# Patient Record
Sex: Female | Born: 1975 | Race: White | Hispanic: No | Marital: Married | Smoking: Never smoker
Health system: Southern US, Community
[De-identification: ages and names within clinical notes are randomized; demographics above are authoritative.]

## PROBLEM LIST (undated history)

## (undated) DIAGNOSIS — D649 Anemia, unspecified: Secondary | ICD-10-CM

## (undated) DIAGNOSIS — O26899 Other specified pregnancy related conditions, unspecified trimester: Secondary | ICD-10-CM

## (undated) DIAGNOSIS — E7212 Methylenetetrahydrofolate reductase deficiency: Secondary | ICD-10-CM

## (undated) DIAGNOSIS — T4145XA Adverse effect of unspecified anesthetic, initial encounter: Secondary | ICD-10-CM

## (undated) DIAGNOSIS — T8859XA Other complications of anesthesia, initial encounter: Secondary | ICD-10-CM

## (undated) DIAGNOSIS — Z1589 Genetic susceptibility to other disease: Secondary | ICD-10-CM

## (undated) DIAGNOSIS — E079 Disorder of thyroid, unspecified: Secondary | ICD-10-CM

## (undated) DIAGNOSIS — IMO0002 Reserved for concepts with insufficient information to code with codable children: Secondary | ICD-10-CM

## (undated) DIAGNOSIS — N946 Dysmenorrhea, unspecified: Secondary | ICD-10-CM

## (undated) DIAGNOSIS — F419 Anxiety disorder, unspecified: Secondary | ICD-10-CM

## (undated) DIAGNOSIS — F329 Major depressive disorder, single episode, unspecified: Secondary | ICD-10-CM

## (undated) DIAGNOSIS — R112 Nausea with vomiting, unspecified: Secondary | ICD-10-CM

## (undated) DIAGNOSIS — I447 Left bundle-branch block, unspecified: Secondary | ICD-10-CM

## (undated) DIAGNOSIS — F32A Depression, unspecified: Secondary | ICD-10-CM

## (undated) DIAGNOSIS — O10919 Unspecified pre-existing hypertension complicating pregnancy, unspecified trimester: Secondary | ICD-10-CM

## (undated) DIAGNOSIS — R12 Heartburn: Secondary | ICD-10-CM

## (undated) DIAGNOSIS — Z98891 History of uterine scar from previous surgery: Secondary | ICD-10-CM

## (undated) DIAGNOSIS — Z9889 Other specified postprocedural states: Secondary | ICD-10-CM

## (undated) HISTORY — DX: Genetic susceptibility to other disease: Z15.89

## (undated) HISTORY — DX: Depression, unspecified: F32.A

## (undated) HISTORY — DX: Major depressive disorder, single episode, unspecified: F32.9

## (undated) HISTORY — DX: Dysmenorrhea, unspecified: N94.6

## (undated) HISTORY — DX: Methylenetetrahydrofolate reductase deficiency: E72.12

## (undated) HISTORY — DX: Left bundle-branch block, unspecified: I44.7

## (undated) HISTORY — DX: Anxiety disorder, unspecified: F41.9

## (undated) HISTORY — PX: TUBAL LIGATION: SHX77

## (undated) HISTORY — PX: ABDOMINAL HYSTERECTOMY: SHX81

---

## 2001-02-27 HISTORY — PX: GASTRIC BYPASS: SHX52

## 2002-02-27 HISTORY — PX: CARDIOVASCULAR STRESS TEST: SHX262

## 2002-08-15 ENCOUNTER — Other Ambulatory Visit: Admission: RE | Admit: 2002-08-15 | Discharge: 2002-08-15 | Payer: Self-pay | Admitting: Family Medicine

## 2002-08-18 ENCOUNTER — Encounter: Admission: RE | Admit: 2002-08-18 | Discharge: 2002-08-18 | Payer: Self-pay | Admitting: Family Medicine

## 2002-08-18 ENCOUNTER — Encounter: Payer: Self-pay | Admitting: Family Medicine

## 2002-11-07 ENCOUNTER — Ambulatory Visit (HOSPITAL_COMMUNITY): Admission: RE | Admit: 2002-11-07 | Discharge: 2002-11-07 | Payer: Self-pay | Admitting: Cardiovascular Disease

## 2002-11-07 HISTORY — PX: CARDIAC CATHETERIZATION: SHX172

## 2002-12-26 ENCOUNTER — Emergency Department (HOSPITAL_COMMUNITY): Admission: EM | Admit: 2002-12-26 | Discharge: 2002-12-26 | Payer: Self-pay | Admitting: Emergency Medicine

## 2004-10-05 ENCOUNTER — Emergency Department (HOSPITAL_COMMUNITY): Admission: EM | Admit: 2004-10-05 | Discharge: 2004-10-05 | Payer: Self-pay | Admitting: Emergency Medicine

## 2005-04-09 ENCOUNTER — Emergency Department (HOSPITAL_COMMUNITY): Admission: EM | Admit: 2005-04-09 | Discharge: 2005-04-10 | Payer: Self-pay | Admitting: Emergency Medicine

## 2005-06-09 ENCOUNTER — Ambulatory Visit (HOSPITAL_COMMUNITY): Admission: RE | Admit: 2005-06-09 | Discharge: 2005-06-09 | Payer: Self-pay | Admitting: Obstetrics and Gynecology

## 2005-06-09 ENCOUNTER — Encounter (INDEPENDENT_AMBULATORY_CARE_PROVIDER_SITE_OTHER): Payer: Self-pay | Admitting: Specialist

## 2006-05-19 ENCOUNTER — Inpatient Hospital Stay (HOSPITAL_COMMUNITY): Admission: AD | Admit: 2006-05-19 | Discharge: 2006-05-19 | Payer: Self-pay | Admitting: Obstetrics and Gynecology

## 2006-07-29 ENCOUNTER — Encounter (INDEPENDENT_AMBULATORY_CARE_PROVIDER_SITE_OTHER): Payer: Self-pay | Admitting: *Deleted

## 2006-07-29 ENCOUNTER — Inpatient Hospital Stay (HOSPITAL_COMMUNITY): Admission: AD | Admit: 2006-07-29 | Discharge: 2006-07-31 | Payer: Self-pay | Admitting: *Deleted

## 2006-08-01 ENCOUNTER — Encounter: Admission: RE | Admit: 2006-08-01 | Discharge: 2006-08-31 | Payer: Self-pay | Admitting: Certified Nurse Midwife

## 2007-06-24 ENCOUNTER — Encounter: Admission: RE | Admit: 2007-06-24 | Discharge: 2007-06-24 | Payer: Self-pay | Admitting: Otolaryngology

## 2008-05-04 ENCOUNTER — Encounter: Admission: RE | Admit: 2008-05-04 | Discharge: 2008-05-04 | Payer: Self-pay | Admitting: Obstetrics and Gynecology

## 2008-05-28 ENCOUNTER — Inpatient Hospital Stay (HOSPITAL_COMMUNITY): Admission: EM | Admit: 2008-05-28 | Discharge: 2008-05-28 | Payer: Self-pay | Admitting: *Deleted

## 2009-03-04 ENCOUNTER — Emergency Department (HOSPITAL_COMMUNITY): Admission: EM | Admit: 2009-03-04 | Discharge: 2009-03-05 | Payer: Self-pay | Admitting: Emergency Medicine

## 2010-03-20 ENCOUNTER — Encounter: Payer: Self-pay | Admitting: Otolaryngology

## 2010-05-15 LAB — COMPREHENSIVE METABOLIC PANEL
ALT: 276 U/L — ABNORMAL HIGH (ref 0–35)
AST: 597 U/L — ABNORMAL HIGH (ref 0–37)
Albumin: 3.6 g/dL (ref 3.5–5.2)
CO2: 21 mEq/L (ref 19–32)
Calcium: 9 mg/dL (ref 8.4–10.5)
GFR calc Af Amer: >60 mL/min (ref >60)
GFR calc non Af Amer: >60 mL/min (ref >60)
Sodium: 135 mEq/L (ref 135–145)
Total Protein: 7.2 g/dL (ref 6.0–8.3)

## 2010-05-15 LAB — URINALYSIS, ROUTINE W REFLEX MICROSCOPIC
Bilirubin Urine: NEGATIVE
Glucose, UA: NEGATIVE mg/dL
Hgb urine dipstick: NEGATIVE
Ketones, ur: NEGATIVE mg/dL
Specific Gravity, Urine: 1.027 (ref 1.005–1.030)
pH: 6 (ref 5.0–8.0)

## 2010-05-15 LAB — DIFFERENTIAL
Eosinophils Absolute: 0 10*3/uL (ref 0.0–0.7)
Eosinophils Relative: 0 % (ref 0–5)
Lymphocytes Relative: 20 % (ref 12–46)
Lymphs Abs: 1.4 10*3/uL (ref 0.7–4.0)
Monocytes Absolute: 0.5 10*3/uL (ref 0.1–1.0)
Monocytes Relative: 7 % (ref 3–12)

## 2010-05-15 LAB — CBC
MCHC: 32.3 g/dL (ref 30.0–36.0)
Platelets: 325 10*3/uL (ref 150–400)
RBC: 4.71 MIL/uL (ref 3.87–5.11)

## 2010-06-08 LAB — CBC
Hemoglobin: 9.3 g/dL — ABNORMAL LOW (ref 12.0–15.0)
MCHC: 31.3 g/dL (ref 30.0–36.0)
MCV: 62.8 fL — ABNORMAL LOW (ref 78.0–100.0)
RBC: 4.72 MIL/uL (ref 3.87–5.11)
WBC: 8.1 10*3/uL (ref 4.0–10.5)

## 2010-06-08 LAB — DIFFERENTIAL
Basophils Relative: 0 % (ref 0–1)
Eosinophils Relative: 0 % (ref 0–5)
Lymphocytes Relative: 3 % — ABNORMAL LOW (ref 12–46)
Monocytes Absolute: 0.2 10*3/uL (ref 0.1–1.0)
Neutrophils Relative %: 95 % — ABNORMAL HIGH (ref 43–77)

## 2010-06-08 LAB — PROTIME-INR
INR: 1 (ref 0.00–1.49)
Prothrombin Time: 13.4 seconds (ref 11.6–15.2)

## 2010-06-08 LAB — URINALYSIS, ROUTINE W REFLEX MICROSCOPIC
Nitrite: NEGATIVE
Protein, ur: NEGATIVE mg/dL
Urobilinogen, UA: 0.2 mg/dL (ref 0.0–1.0)

## 2010-06-08 LAB — COMPREHENSIVE METABOLIC PANEL
ALT: 199 U/L — ABNORMAL HIGH (ref 0–35)
AST: 460 U/L — ABNORMAL HIGH (ref 0–37)
CO2: 21 mEq/L (ref 19–32)
Calcium: 8.5 mg/dL (ref 8.4–10.5)
Chloride: 108 mEq/L (ref 96–112)
Creatinine, Ser: 0.77 mg/dL (ref 0.4–1.2)
Glucose, Bld: 132 mg/dL — ABNORMAL HIGH (ref 70–99)

## 2010-07-12 NOTE — Consult Note (Signed)
[name], [name]               ACCOUNT NO.:  [id number]   MEDICAL RECORD NO.:  [id number]          PATIENT TYPE:  INP   LOCATION:  9109                          [facility]  [facility]   PHYSICIAN:  Cassell Clement, M.D. DATE OF BIRTH:  04/17/1975   DATE OF CONSULTATION:  07/30/2006  DATE OF DISCHARGE:                                 CONSULTATION   HISTORY:  This is a 35 year old married Caucasian female known to me.  She is presently one day post cesarean section which was undertaken  because of a marginal abruption.  She lost a lot of blood at the time of  the abruption.  Postoperatively, she has been noted to be intermittently  in left bundle-branch block.  She has a remote history of palpitations  and had a full workup in 2004.  At that time, she was found to have a  rate-related left bundle-branch block.  Because of an equivocal  Cardiolite stress test, she underwent cardiac catheterization in 2004  which showed normal LV function and normal coronary arteries.  She was  last seen in the office May 22, 2006 after she had presented to  [facility] with dehydration and tachycardia.  EKG  done at [facility] on May 19, 2006 showed no evidence  of ischemia.   PAST MEDICAL HISTORY:  Her past medical history is positive for morbid  obesity and she has had previous gastric bypass with loss of  considerable amount of weight.  She has had a past history of anemia.  She does not use alcohol or tobacco.  She is married.  She works as an  Oncologist at [employer].   FAMILY HISTORY:  Unremarkable.   REVIEW OF SYSTEMS:  Otherwise unremarkable.   PHYSICAL EXAMINATION:  VITAL SIGNS:  Blood pressure 130/70, pulse 70  regular, respirations are normal.  HEAD/NECK:  Normal jugular venous pressure.  Carotids are normal.  Color  is slightly pale.  CHEST:  Clear to auscultation without rales or rhonchi.  HEART:  No murmur, gallop, rub or click.  ABDOMEN:  Not  examined.  EXTREMITIES:  Thick ankles with trace edema.   The telemetry shows normal sinus rhythm with intermittent left bundle-  branch block.   IMPRESSION:  The patient is stable from the cardiac standpoint.  Her  left bundle-branch block is rate-related and is chronic and is  asymptomatic and requires no further treatment.  It is okay at this time  to discontinue telemetry and to move her to a regular postop floor.           ______________________________  Cassell Clement, M.D.     TB/MEDQ  D:  07/30/2006  T:  07/30/2006  Job:  [id number]   cc:   Marlinda Mike, C.N.M.

## 2010-07-12 NOTE — Op Note (Signed)
[name], [name]               ACCOUNT NO.:  [id number]   MEDICAL RECORD NO.:  [id number]          PATIENT TYPE:  INP   LOCATION:  9198                          [facility]   PHYSICIAN:  Gerri Spore B. Earlene Plater, M.D.  DATE OF BIRTH:  [birth year]   DATE OF PROCEDURE:  07/29/2006  DATE OF DISCHARGE:                               OPERATIVE REPORT   PREOPERATIVE DIAGNOSIS:  At 39 weeks with spontaneous rupture of  membranes and suspected abruption.   POSTOPERATIVE DIAGNOSIS:  At 39 weeks with spontaneous rupture of  membranes and suspected abruption.   PROCEDURE:  Primary low transverse cesarean section.   SURGEON:  Chester Holstein. Earlene Plater, M.D.   ASSISTANT:  Marlinda Mike, C.N.M.   ANESTHESIA:  Spinal.   SPECIMENS:  Placenta to pathology.   BLOOD LOSS:  800 mL.   COMPLICATIONS:  None.   FINDINGS:  Viable female, 7 pounds 3 ounces.  Apgars were 9 and 9.  Partial placental abruption with clot noted.  Cord pH 7.2.   INDICATIONS FOR PROCEDURE:  Patient presented with spontaneous rupture  of membranes this morning which was grossly bloody.  She was not having  abdominal pain, had no known risk factors for abruptio placenta.  She  has history of a right bundle branch block as well as morbid obesity,  status post gastric bypass via laparotomy years ago.  In maternity  admissions, patient was found to have grossly bloody amniotic fluid and  cervix was 1 cm dilated and fetal heart rate in the 140s with decreased  variability, no decelerations.  Uterine irritability noted but no  definite contractions.  Given her remoteness from delivery and other  risk factors, I recommended proceeding with a primary C. section at this  point rather than awaiting or inducing labor. The potential need for  stat section would be further complicated given her morbid obesity and  cardiac condition.  Patient in agreement.  Risks of surgery discussed  including infection, bleeding, damage to surrounding organs.   PROCEDURE:  Patient taken to the operating room and spinal anesthesia  obtained.  She was prepped and draped in standard fashion and Foley  catheter inserted into the bladder.  Pfannenstiel incision made.  Fascia  divided sharply.  Underlying rectus muscles dissected away sharply.  Posterior sheath of peritoneum elevated and entered sharply.  Bladder  flap created sharply.  The Alexis retracted was inserted and checked to  be free of bowel.  It was secured in a standard manner with good  exposure obtained.  Bladder flap created sharply.   Uterine incision made in low transverse fashion with a knife.  Bloody  amniotic fluid noted at amniotomy.  The incision was extended laterally  with bandage scissors.   The vertex was elevated through the incision and with fundal pressure,  delivered without difficulty.  Nose and mouth suctioned with a bulb.  Remainder of the infant delivered without difficulty.  Cord clamped and  cut.  Infant handed off to awaiting pediatricians.  Ancef 1 gram given  at cord clamp.   Placenta was inspected and there was associated 4cm  clot at the inferior  edge.  It was removed manually.  Uterus was cleared of all clots and  debris.  Placenta submitted to pathology after cord pH obtained.  Uterine incision was closed in a running locked fashion with 0 chromic.  Second imbricating layer placed with same suture.  Hemostasis obtained.  Pelvis was irrigated.  Bladder flap, uterine incision and subfascial  space were all hemostatic.  The fascia was closed with a running stitch  of 0 Vicryl.  The subcutaneous tissue was irrigated and made hemostatic  with the Bovie and reapproximated with running stitch of 2-0 plain  suture.  Skin closed with staples.   The patient tolerated the procedure with no complications.  She was  taken to the recovery room awake, alert and in stable condition.  She  will be kept in the AICU for the first 24 hours for telemetry given her  history  of a right bundle branch block.      Gerri Spore B. Earlene Plater, M.D.  Electronically Signed     WBD/MEDQ  D:  07/29/2006  T:  07/29/2006  Job:  [id number]

## 2010-07-12 NOTE — H&P (Signed)
[name], [name]               ACCOUNT NO.:  [id number]   MEDICAL RECORD NO.:  [id number]          PATIENT TYPE:  EMS   LOCATION:  ED                           [facility]   PHYSICIAN:  Michiel Cowboy, MDDATE OF BIRTH:  02-Jul-1975   DATE OF ADMISSION:  05/28/2008  DATE OF DISCHARGE:                              HISTORY & PHYSICAL   PRIMARY CARE PHYSICIAN:  Dr. Gweneth Dimitri.   CHIEF COMPLAINT:  Nausea, vomiting, diarrhea.   The patient is a 35 year old female with history of morbid obesity  status post gastric bypass and history of depression.  Since 11 p.m.  today she had been having nausea, vomiting, and diarrhea, and feels very  dehydrated with occasional sharp pains in her abdomen.  Her 57-year-old  daughter had similar illness yesterday and this is currently resolved,  but her illness seemed to have just started.  No fevers, no chills.  They all ate out at a restaurant yesterday, but her husband has not had  any symptoms.  Otherwise, no other sick contacts.   Otherwise, no chest pain, no shortness of breath.   REVIEW OF SYSTEMS:  Negative except for as HPI.   PAST MEDICAL HISTORY:  Significant for:   1. Left bundle branch block.  2. Status post catheterization.  3. Obesity.  4. Status post gastric bypass.  5. Depression.  6. Anemia.   SOCIAL HISTORY:  The patient does not smoke, does not use drugs, does  not drink alcohol.  Lives at home.  Has support of family.   __________  Noncontributory.   ALLERGIES:  PENICILLIN.   MEDICATIONS:  1. Iron daily.  2. Wellbutrin 300 mg daily.   VITALS:  Temperature 98.4, blood pressure 120/51, pulse 104,  respirations 16, saturating 97% on room air.  The patient is morbidly obese.  HEAD:  Nontraumatic.  Somewhat dryish mucous membranes, normal skin  turgor.  LUNGS:  Distant breath sounds bilaterally, but no wheezes or crackles  appreciated.  HEART:  Regular rate and rhythm, somewhat rapid.  No murmurs could  be  appreciated.  ABDOMEN:  Diffusely obese, nontender, nondistended.  LOWER EXTREMITIES:  Without clubbing, cyanosis or edema, but diffusely  obese.  NEUROLOGICAL:  Appears to be intact.  SKIN:  intact.   LABORATORIES:  White blood cell count 8.1, hemoglobin 9.3.  Sodium 137,  potassium 4.5, creatinine 0.77.  Total bilirubin 0.8, alk phos elevated  at 147.  AST elevated at 460, ALT 199.  Lipase 57.  Albumin 3.3.  Urine  pregnancy negative.  KUB showing gastroenteritis.   ASSESSMENT AND PLAN:  This is a 35 year old female with elevated liver  function tests, nausea, vomiting, diarrhea.  1. Elevated liver function tests.  Etiology not quite clear.  Could be      related to fatty liver infiltration versus hepatitis.  Does not      seem to be an obstructive picture, but gallbladder involvement      could not be completely ruled out.  The patient is arranged for a      computed tomography scan to evaluate her liver.  Will obtain also      right upper quadrant ultrasound in the morning to evaluate her      gallbladder better.  Will obtain hepatitis serologies, check ANA      and sed rate, fasting lipid panel, and hemoglobin A1c.  2. Concerning her diarrhea, we will get stool cultures, stool studies.  3. Dehydration.  Will give intravenous fluids.  4. Prophylaxis Protonix plus Lovenox.      Michiel Cowboy, MD  Electronically Signed     AVD/MEDQ  D:  05/28/2008  T:  05/28/2008  Job:  [id number]   cc:   Pam Drown, M.D.  Fax: [phone]

## 2010-07-15 NOTE — Discharge Summary (Signed)
NAMECHAKARA, BOGNAR               ACCOUNT NO.:  [id number]   MEDICAL RECORD NO.:  [id number]          PATIENT TYPE:  INP   LOCATION:  9109                          [facility]   PHYSICIAN:  Gerri Spore B. Earlene Plater, M.D.  DATE OF BIRTH:  [birth year]   DATE OF ADMISSION:  07/29/2006  DATE OF DISCHARGE:  07/31/2006                               DISCHARGE SUMMARY   ADMISSION DIAGNOSES:  1. A 39-week intrauterine pregnancy.  2. Spontaneous rupture of membranes.  3. Marginal abruption.   DISCHARGE DIAGNOSIS:  1. A 39-week intrauterine pregnancy.  2. Spontaneous range of motion.  3. Marginal abruption.   HISTORY OF PRESENT ILLNESS:  A 35 year old white female, gravida 1, para  0-0-1-0 who presented at 39-plus weeks with spontaneous rupture of  membranes with associated bloody fluid.  She deemed remote from delivery  with a cervix that was 1 cm dilated.  In addition, she had a history of  bundle branch block.  She was bleeding heavily at home, passing clots as  large as baseball size.  Given the heavy amount of bleeding and the  remoteness from delivery, I recommended a C-section.   HOSPITAL COURSE:  Patient was admitted, underwent primary low transverse  C-section of a 7 pound 3 ounce female with 9 and 9 Apgars, partial  abruption noted, cord pH of 7.2.   Postoperatively patient was kept on telemetry.  Cardiology consult  obtained and was deemed stable.  Subsequently patient was discharged  home on he second postoperative day in satisfactory condition.   DISCHARGE INSTRUCTIONS:  Per booklet.   DISCHARGE MEDICATIONS:  Tylox 1-2 p.o. q.4-6h. p.r.n. pain.   FOLLOWUP:  In six weeks.   DISPOSITION:  Discharged satisfactory.      Gerri Spore B. Earlene Plater, M.D.  Electronically Signed     WBD/MEDQ  D:  09/03/2006  T:  09/03/2006  Job:  [id number]

## 2010-07-15 NOTE — Op Note (Signed)
[name], [name]               ACCOUNT NO.:  [id number]   MEDICAL RECORD NO.:  [id number]          PATIENT TYPE:  AMB   LOCATION:  SDC                           [facility]   PHYSICIAN:  Hal Morales, M.D.DATE OF BIRTH:  [birth year]   DATE OF PROCEDURE:  06/09/2005  DATE OF DISCHARGE:                                 OPERATIVE REPORT   PREOPERATIVE DIAGNOSIS:  Incomplete spontaneous abortion versus missed  abortion.   POSTOPERATIVE DIAGNOSIS:  Incomplete spontaneous abortion versus missed  abortion.   OPERATION:  Suction dilatation and evacuation.   SURGEON:  Vanessa P. Pennie Rushing, M.D.   ANESTHESIA:  Monitored anesthesia care and local.   ESTIMATED BLOOD LOSS:  Less than 25 cc.   COMPLICATIONS:  None.   FINDINGS:  The uterus was enlarged to approximately 8-10 weeks size.  There  was a large amount of products of conception at the time of D&E.   PROCEDURE:  The patient was taken to the operating room after appropriate  identification and placed on the operating table.  After placement of  equipment for monitored anesthesia care, she was placed in the lithotomy  position.  Perineum and vagina were prepped with multiple layers of Betadine  and draped in a sterile field.  A red Robinson catheter was used into the  bladder.  A Graves speculum was placed in the vagina, and a single-tooth  tenaculum was placed on the anterior cervix.  A paracervical block was  achieved a total of 10 cc of 2% Xylocaine in the 5 and 7 o'clock positions.  The cervix was then dilated to accommodate a #10 suction curette, and it was  introduced, and all quadrants of the endometrial cavity suction curetted.  A  sharp curette was used to ensure that all products of conception had been  removed.  Hemostasis was noted to be adequate.  The single-tooth speculum  was then removed, and bleeding from the tenaculum site was resolved with  application of silver nitrate.  The patient was then taken from  the  operating room to the recovery room in satisfactory condition, having  tolerated the procedure well, with sponge and instrument counts correct.   SPECIMENS TO PATHOLOGY:  Products of conception.   DISCHARGE INSTRUCTIONS:  Printed instructions from the [facility] for  [facility].   DISCHARGE MEDICATIONS:  1.  Doxycycline 100 mg p.o. b.i.d. for 5 days.  2.  Ibuprofen over the counter 600 mg p.o. q.6 h. p.r.n. pain  3.  Methergine 0.2 mg p.o. q.6 h. for a total of 8 doses.   FOLLOWUP:  Two weeks at [facility].   BLOOD TYPE:  A+.      Hal Morales, M.D.  Electronically Signed     VPH/MEDQ  D:  06/09/2005  T:  06/09/2005  Job:  [id number]

## 2010-07-15 NOTE — Cardiovascular Report (Signed)
   [name], [name]                      ACCOUNT NO.:  [id number]   MEDICAL RECORD NO.:  [id number]                   PATIENT TYPE:  OIB   LOCATION:  2891                                 FACILITY:  [facility]   PHYSICIAN:  Vesta Mixer, M.D.              DATE OF BIRTH:  [birth year]   DATE OF PROCEDURE:  11/07/2002  DATE OF DISCHARGE:                              CARDIAC CATHETERIZATION   INDICATIONS:  Cai Flott is a 35 year old female with a recent  diagnosis of a left bundle branch block.  She had a stress Cardiolite study  which revealed anterior apical ischemia.  She was referred for heart  catheterization for further evaluation.   PROCEDURE:  Left heart catheterization with coronary angiography.   PROCEDURE IN DETAIL:  The right femoral artery was easily cannulated using a  modified Seldinger technique.   HEMODYNAMICS:  The left ventricular pressure is 142/19 with an aortic  pressure of 142/80.   ANGIOGRAPHY:  The left main coronary artery is smooth and normal.   The left anterior descending artery is smooth and normal.  There are several  moderate sized diagonal branches which are normal.   The left circumflex artery is normal.  It is a very large vessel.  The first  obtuse marginal artery is a fairly small vessel and is normal.  The second  obtuse marginal artery is extremely large and is normal.  The terminal left  circumflex is fairly small and is normal.   The right coronary artery is large and dominant.  It is smooth and normal  throughout its course.  The posterior descending artery and the  posterolateral segment artery are normal.   LEFT VENTRICULOGRAM:  The left ventriculogram was performed in a 30 RAO  position.  It reveals overall normal left ventricular systolic function with  an ejection fraction of 65%.  There are no segmental wall motion  abnormalities.   COMPLICATIONS:  None.    CONCLUSION:  1. Smooth and normal coronary arteries.  2.  Normal left ventricular systolic function.                                               Vesta Mixer, M.D.    PJN/MEDQ  D:  11/07/2002  T:  11/07/2002  Job:  [id number]   cc:   Talmadge Coventry, M.D.  [address]  [place]  [place] [address]  Fax: [phone]

## 2010-07-15 NOTE — H&P (Signed)
NAME:  LACEE, GREY NO.:  [id number]   MEDICAL RECORD NO.:  [id number]                   PATIENT TYPE:  OIB   LOCATION:                                       FACILITY:  [facility]   PHYSICIAN:  Vesta Mixer, M.D.              DATE OF BIRTH:  Feb 15, 1976   DATE OF ADMISSION:  11/07/2002  DATE OF DISCHARGE:                                HISTORY & PHYSICAL   [name] is a young female with a recent onset of palpitations and some  shortness of breath. She also has a history of morbid obesity. We are asked  to see her for further evaluation following an abnormal stress test.   [name] has a long history of morbid obesity and used to weigh excess of over  400 pounds. She had a gastric bypass grafting last year and has lost about  180 pounds. She recently started having some episodes of palpitations and  intermittent left bundle branch block. She had a stress Cardiolite study by  Dr. Patty Sermons and was found to have a related left bundle branch block. She  also was found to have an anterior apical defect that was consistent with  ischemia. I am asked to see her for further evaluation of these issues.   [name] has not had any episodes of chest pain or shortness of breath. She  does become fatigued with exertion but is able to workout on the elliptical  machine on a regular basis. She has not had any episodes of angina, syncope,  or presyncope.   Her medications are birth control pills once a day, Claritin over the  counter, and Toprol XL 25 mg a day since last week. She is allergic to  amoxicillin which causes her to have hives.   PAST MEDICAL HISTORY:  1. History of morbid obesity.  2. History of anemia.  3. History of left bundle branch block.   SOCIAL HISTORY:  The patient does not smoke and does not drink alcohol. She  is married. She works as an Oncologist at [employer].   FAMILY HISTORY:  Essentially negative.   REVIEW OF SYSTEMS:  Her review of systems  was reviewed and is essentially  negative.   PHYSICAL EXAMINATION:  GENERAL:  On exam, she is a young female in no acute  distress. She is alert and oriented x3, and her mood and affect are normal.  VITAL SIGNS:  Her weight is 249. Her blood pressure is 130/90 with heart  rate of 80.  HEENT:  Reveals 2+ carotids. She has no bruits. There is no JVD. No  thyromegaly.  LUNGS:  Clear to auscultation.  HEART:  Regular rate, S1 and S2. She has no murmurs.  ABDOMEN:  Reveals good bowel sounds and is nontender.  EXTREMITIES:  She has no clubbing, cyanosis, or edema.  NEUROLOGICAL:  Nonfocal.   STUDIES:  Review of the Cardiolite images revealed an anterior wall  defect  that is consistent with ischemia. I cannot completely exclude breast  attenuation although there was a reversible component to the Cardiolite  imaging.   Her left ventricular systolic function at the time of the Cardiolite study  was reduced but it was normal with her previous echocardiogram.   IMPRESSION:  [name] presents with several problems including an abnormal  Cardiolite and clinical palpitations and PVCs. Given the results of the  Cardiolite and her left bundle branch block, I have recommended that we  proceed with heart catheterization. It is possible that she has had some  damage given her morbid obesity for many years. It is also possible that she  may have an anomalous coronary artery. Did discuss the risks, benefits, and  options of heart catheterization. She understands and agrees to proceed with  heart catheterization on Friday.                                                Vesta Mixer, M.D.    PJN/MEDQ  D:  11/04/2002  T:  11/05/2002  Job:  [id number]   cc:   Cassell Clement, M.D.  [address]  [place]  [place] [address]  Fax: [phone]

## 2010-12-15 LAB — CBC
MCHC: 34.3
MCV: 88.9
Platelets: 223
Platelets: 232
RDW: 13.3
WBC: 10.4
WBC: 10.4

## 2010-12-15 LAB — DIC (DISSEMINATED INTRAVASCULAR COAGULATION)PANEL
Fibrinogen: 534 — ABNORMAL HIGH
Platelets: 232
Prothrombin Time: 12.6
aPTT: 25

## 2010-12-15 LAB — COMPREHENSIVE METABOLIC PANEL
AST: 22
Albumin: 2.6 — ABNORMAL LOW
Alkaline Phosphatase: 94
Chloride: 106
GFR calc Af Amer: >60
Potassium: 3.7
Total Bilirubin: 0.7

## 2011-06-22 ENCOUNTER — Other Ambulatory Visit: Payer: Self-pay

## 2011-06-22 LAB — OB RESULTS CONSOLE ANTIBODY SCREEN: Antibody Screen: NEGATIVE

## 2011-06-22 LAB — OB RESULTS CONSOLE HIV ANTIBODY (ROUTINE TESTING): HIV: NONREACTIVE

## 2011-07-04 ENCOUNTER — Ambulatory Visit (HOSPITAL_COMMUNITY)
Admission: RE | Admit: 2011-07-04 | Discharge: 2011-07-04 | Disposition: A | Discharge status: Home / Self Care | Payer: PRIVATE HEALTH INSURANCE | Source: Ambulatory Visit | Attending: Certified Nurse Midwife | Admitting: Certified Nurse Midwife

## 2011-07-04 DIAGNOSIS — O99210 Obesity complicating pregnancy, unspecified trimester: Secondary | ICD-10-CM

## 2011-07-04 DIAGNOSIS — O2620 Pregnancy care for patient with recurrent pregnancy loss, unspecified trimester: Secondary | ICD-10-CM

## 2011-07-07 ENCOUNTER — Ambulatory Visit (HOSPITAL_COMMUNITY): Payer: Self-pay

## 2011-07-17 LAB — OB RESULTS CONSOLE GC/CHLAMYDIA
Chlamydia: NEGATIVE
Gonorrhea: NEGATIVE

## 2011-07-17 LAB — OB RESULTS CONSOLE ABO/RH: RH Type: POSITIVE

## 2011-07-28 ENCOUNTER — Institutional Professional Consult (permissible substitution): Payer: Self-pay | Admitting: Cardiovascular Disease

## 2011-08-14 ENCOUNTER — Encounter: Payer: Self-pay | Admitting: *Deleted

## 2011-08-15 ENCOUNTER — Encounter (HOSPITAL_COMMUNITY): Payer: Self-pay

## 2011-08-15 ENCOUNTER — Ambulatory Visit (HOSPITAL_COMMUNITY)
Admission: RE | Admit: 2011-08-15 | Discharge: 2011-08-15 | Disposition: A | Discharge status: Home / Self Care | Payer: BC Managed Care – PPO | Source: Ambulatory Visit | Attending: Certified Nurse Midwife | Admitting: Certified Nurse Midwife

## 2011-08-15 VITALS — BP 132/68 | HR 89 | Wt 354.0 lb

## 2011-08-15 DIAGNOSIS — E669 Obesity, unspecified: Secondary | ICD-10-CM | POA: Insufficient documentation

## 2011-08-15 DIAGNOSIS — Z363 Encounter for antenatal screening for malformations: Secondary | ICD-10-CM | POA: Insufficient documentation

## 2011-08-15 DIAGNOSIS — O2620 Pregnancy care for patient with recurrent pregnancy loss, unspecified trimester: Secondary | ICD-10-CM

## 2011-08-15 DIAGNOSIS — O99210 Obesity complicating pregnancy, unspecified trimester: Secondary | ICD-10-CM

## 2011-08-15 DIAGNOSIS — O358XX0 Maternal care for other (suspected) fetal abnormality and damage, not applicable or unspecified: Secondary | ICD-10-CM | POA: Insufficient documentation

## 2011-08-15 DIAGNOSIS — O34219 Maternal care for unspecified type scar from previous cesarean delivery: Secondary | ICD-10-CM | POA: Insufficient documentation

## 2011-08-15 DIAGNOSIS — Z1389 Encounter for screening for other disorder: Secondary | ICD-10-CM | POA: Insufficient documentation

## 2011-08-15 NOTE — Progress Notes (Signed)
Patient seen today  for ultrasound appointment.  See full report in [facility].  Alpha Gula, MD  Single IUP at 18 5/7 weeks Normal anatomic fetal survey; however, limited views of the fetal heart and face were obtained Normal amniotic fluid volume  Recommend follow up ultrasound in 4 weeks to reevaluate the fetal heart and face

## 2011-08-21 ENCOUNTER — Institutional Professional Consult (permissible substitution): Payer: Self-pay | Admitting: Cardiovascular Disease

## 2011-09-12 ENCOUNTER — Ambulatory Visit (HOSPITAL_COMMUNITY)
Admission: RE | Admit: 2011-09-12 | Discharge: 2011-09-12 | Disposition: A | Discharge status: Home / Self Care | Payer: PRIVATE HEALTH INSURANCE | Source: Ambulatory Visit | Attending: Certified Nurse Midwife | Admitting: Certified Nurse Midwife

## 2011-09-12 VITALS — BP 126/78 | HR 95 | Wt 354.0 lb

## 2011-09-12 DIAGNOSIS — E669 Obesity, unspecified: Secondary | ICD-10-CM | POA: Insufficient documentation

## 2011-09-12 DIAGNOSIS — O99210 Obesity complicating pregnancy, unspecified trimester: Secondary | ICD-10-CM | POA: Insufficient documentation

## 2011-09-12 DIAGNOSIS — O2620 Pregnancy care for patient with recurrent pregnancy loss, unspecified trimester: Secondary | ICD-10-CM

## 2011-09-12 DIAGNOSIS — O34219 Maternal care for unspecified type scar from previous cesarean delivery: Secondary | ICD-10-CM | POA: Insufficient documentation

## 2011-09-12 NOTE — Progress Notes (Signed)
Patient seen today  for follow up ultrasound.  See full report in [facility].  Alpha Gula, MD  Single IUP at 22 5/7 weeks Normal interval anatomy - the fetal survey is now complete Fetal growth is appropriate (75th %tile) Normal amniotic fluid volume  Recommend follow up ultrasound in 6 weeks for interval growth.

## 2011-09-19 NOTE — Progress Notes (Addendum)
MFM Note  [name] is a 36 year old G1P1A2 Caucasian female at 12+ weeks who presents for consultation regarding the following high risk factors: S/P gastric bypass, morbid obesity, left bundle branch block, heterozygous for MTHFR mutation, H/O abruption at 43 weeks with subsequent cesarean section and a H/O two missed abortions.  Her OB history is as follows: G1: missed AB at [redacted] weeks gestation with D&C in 2007 G2: uncomplicated pregnancy until an abruption at 91 weeks requiring urgent C/S in 2008; female weighing 7+3 G3: missed AB at 9 weeks in 2012 G4: current pregnancy  Medical history: as above, cardiac cath in 2004 Surgical history: knee surgery as teenager, tonsillectomy in 2002, Roux-en-Y gastric bypass in 2003 Medications: PNV, folic acid, ASA 81 mg Allergies: PCN  1) S/P gastric bypass: because of the type of bypass she had in 2003, she is at risk for micronutrient deficiencies which could increase her risk for open neural tube defects, anemia and electrolyte abnormalities; she is also at risk for bowel obstruction  - check levels of vitamins D and B12, folate, iron, calcium, thiamine and ferritin; supplement any deficiencies  - have a high index of suspicion for symptoms of bowel obstruction  - would use fasting and 2 hour postprandial blood sugars to diagnose gestational diabetes early in the pregnancy and repeat in the early third trimester; can also obtain a                             HgbA1c - if normal, still needs GDM screening  2) Morbid obesity (345 lbs): we reviewed her increased risk for diabetes, preeclampsia, C/S, anesthesia complications, a LGA fetus, preterm delivery due to maternal complications, congenital anomalies, postpartum hemorrhage, sleep apnea and stillbirth  - offer detailed fetal anatomy and MSAFP  - serial USs for growth  - antenatal testing at 32 weeks  - limit weight gain to ~ 10-20 lbs; walk 30 min daily  - close observation for [facility]  - prepare for  postpartum hemorrhage  3) Right bundle branch block: diagnosed ~ 8 years ago and has been asymptomatic; since Ms. 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 Please call with any questions or concerns.   (Face-to-face consultation with patient: 30 min)

## 2011-09-25 ENCOUNTER — Encounter: Payer: Self-pay | Admitting: Cardiovascular Disease

## 2011-09-25 ENCOUNTER — Ambulatory Visit (INDEPENDENT_AMBULATORY_CARE_PROVIDER_SITE_OTHER): Payer: BC Managed Care – PPO | Admitting: Cardiovascular Disease

## 2011-09-25 VITALS — BP 136/84 | HR 92 | Ht 68.0 in | Wt 355.0 lb

## 2011-09-25 DIAGNOSIS — Z331 Pregnant state, incidental: Secondary | ICD-10-CM

## 2011-09-25 DIAGNOSIS — I341 Nonrheumatic mitral (valve) prolapse: Secondary | ICD-10-CM | POA: Insufficient documentation

## 2011-09-25 DIAGNOSIS — I447 Left bundle-branch block, unspecified: Secondary | ICD-10-CM

## 2011-09-25 DIAGNOSIS — Z3490 Encounter for supervision of normal pregnancy, unspecified, unspecified trimester: Secondary | ICD-10-CM | POA: Insufficient documentation

## 2011-09-25 DIAGNOSIS — I059 Rheumatic mitral valve disease, unspecified: Secondary | ICD-10-CM

## 2011-09-25 NOTE — Progress Notes (Signed)
    Gerhard Perches Date of Birth  [birth year]       [facility]    [place] [address]  [address] [place], [place]  [address]   [place], [place]  [address] [phone]     [phone]   Fax  [phone]    Fax [phone]  Problem List: 1. Left Bundle Branch Block   History of Present Illness:  Ellery is a 36 yo with hx of LBBB.  She is now [redacted] weeks pregnant and is here for pre-delivery visit.  She's not had any cardiac complaints since I last saw her in 2004.  Current Outpatient Prescriptions on File Prior to Visit  Medication Sig Dispense Refill  . aspirin 81 MG chewable tablet Chew 81 mg by mouth daily.      . folic acid (FOLVITE) 400 MCG tablet Take 400 mcg by mouth daily.      . Prenatal Vit-Fe Fumarate-FA (PRENATAL MULTIVITAMIN) TABS Take 1 tablet by mouth daily.        Allergies  Allergen Reactions  . Penicillins     Past Medical History  Diagnosis Date  . LBBB (left bundle branch block)   . Obesity   . Depression   . Anemia     Past Surgical History  Procedure Date  . Gastric bypass     status Post  . Cardiac catheterization 11-07-2002    The left anterior descending artery is smooth and normal. There are several moderate sized diagonal branch which are normal. The left circumflex artery is normal. It is a very large vessel. The right coronary artery is large and dominant. EF 65%. Smooth and normal coronary arteries. Normal left ventricular systolic function.   . Cardiovascular stress test 2004    revealed anterior apical ischemia.  . Cesarean section     History  Smoking status  . Never Smoker   Smokeless tobacco  . Not on file    History  Alcohol Use No    No family history on file.  Reviw of Systems:  Reviewed in the HPI.  All other systems are negative.  Physical Exam: Blood pressure 136/84, pulse 92, height 5\' 8"  (1.727 m), weight 355 lb (161.027 kg), last menstrual period  04/06/2011. General: Well developed, well nourished, in no acute distress.  Head: Normocephalic, atraumatic, sclera non-icteric, mucus membranes are moist,   Neck: Supple. Carotids are 2 + without bruits. No JVD  Lungs: Clear bilaterally to auscultation.  Heart: regular rate.  normal  S1 S2. There is a midsystolic click associated with a very brief systolic murmur. This is consistent with mitral valve prolapse.  Abdomen: Soft, non-tender, non-distended with normal bowel sounds. No hepatomegaly. No rebound/guarding. No masses.  Msk:  Strength and tone are normal  Extremities: No clubbing or cyanosis. No edema.  Distal pedal pulses are 2+ and equal bilaterally.  Neuro: Alert and oriented X 3. Moves all extremities spontaneously.  Psych:  Responds to questions appropriately with a normal affect.  ECG: 09/25/11 - NSR. LBBB  Assessment / Plan:

## 2011-09-25 NOTE — Patient Instructions (Addendum)
Your physician recommends that you schedule a follow-up appointment in: AS NEEDED BASIS  

## 2011-09-25 NOTE — Assessment & Plan Note (Addendum)
[name] presents for further evaluation of her left bundle branch block. I saw her in 2004 for left bundle branch block. At that time a cardiac catheterization revealed normal coronary arteries. She's not had any symptoms at that time. She had normal left ventricular systolic function at that time.  She should be at relatively low risk for upcoming pregnancy and delivery. She has mitral valve  prolapse which also should be a benign condition.  We'll encourage her to continue with the diet and exercise program. We'll get an echocardiogram in several years. She plans on losing weight and we'll get an echocardiogram at that time.

## 2011-09-25 NOTE — Assessment & Plan Note (Signed)
Her cardiac exam is consistent with mitral valve prolapse. We'll plan on getting an echocardiogram at some point in the next several years.

## 2011-10-24 ENCOUNTER — Ambulatory Visit (HOSPITAL_COMMUNITY)
Admission: RE | Admit: 2011-10-24 | Discharge: 2011-10-24 | Disposition: A | Discharge status: Home / Self Care | Payer: BC Managed Care – PPO | Source: Ambulatory Visit | Attending: Certified Nurse Midwife | Admitting: Certified Nurse Midwife

## 2011-10-24 VITALS — BP 143/80 | HR 90 | Wt 358.0 lb

## 2011-10-24 DIAGNOSIS — E669 Obesity, unspecified: Secondary | ICD-10-CM | POA: Insufficient documentation

## 2011-10-24 DIAGNOSIS — O99210 Obesity complicating pregnancy, unspecified trimester: Secondary | ICD-10-CM | POA: Insufficient documentation

## 2011-10-24 DIAGNOSIS — IMO0002 Reserved for concepts with insufficient information to code with codable children: Secondary | ICD-10-CM

## 2011-10-24 DIAGNOSIS — O34219 Maternal care for unspecified type scar from previous cesarean delivery: Secondary | ICD-10-CM | POA: Insufficient documentation

## 2011-10-24 NOTE — Progress Notes (Signed)
[name]  was seen today for an ultrasound appointment.  See full report in [facility].  Alpha Gula, MD  Single IUP at 28 5/7 weeks Normal interval anatomy Interval fetal growth is appropriate (72nd %tile) Normal amniotic fluid volume  Recommend follow up ultrasound in 4 weeks for interval growth due to hx of previous gastric bypass

## 2011-11-02 LAB — OB RESULTS CONSOLE RPR: RPR: NONREACTIVE

## 2011-11-22 ENCOUNTER — Ambulatory Visit (HOSPITAL_COMMUNITY): Payer: BC Managed Care – PPO

## 2011-12-08 ENCOUNTER — Telehealth: Payer: Self-pay | Admitting: Cardiovascular Disease

## 2011-12-08 DIAGNOSIS — Z3490 Encounter for supervision of normal pregnancy, unspecified, unspecified trimester: Secondary | ICD-10-CM

## 2011-12-08 DIAGNOSIS — I447 Left bundle-branch block, unspecified: Secondary | ICD-10-CM

## 2011-12-08 NOTE — Telephone Encounter (Signed)
Patient called, because she said was in her OB today, and he recommended for pt to have an echo ASAP  prior the delivery. Patient is due to deliver  in a couple of weeks.

## 2011-12-08 NOTE — Telephone Encounter (Signed)
Kathi Ludwig NP aware of pt's Ob requesting an echo per pt. NP would like for Dr. Elease Hashimoto to make that decision. Patient is aware. Also pt  is aware that  Dr. Is on vacation this week , he will be in the office on Monday. Pt verbalized understanding.

## 2011-12-08 NOTE — Telephone Encounter (Signed)
New Problem:    Patient called in stating that her OB is requiring her to have an ECHO before she goes into labor or is eligible to have a C-Section.  Please call back.

## 2011-12-10 NOTE — Telephone Encounter (Signed)
I am not clear why the patient's OB is requesting an echo.  She has a LBBB that has been worked up in the past.  She had a normal cardiac cath in 2004.  She has MVP which is benign.  Is she having problems with CHF?  Please find out her OB's name and contact and I will try to call him this week to see what the issue is.  Perhaps the OB does not know that her issues have been evaluated.

## 2011-12-11 NOTE — Telephone Encounter (Signed)
msg left that echo will be ordered and to expect a call back today or tomorrow with date and time, our number provided.

## 2011-12-13 ENCOUNTER — Other Ambulatory Visit (HOSPITAL_COMMUNITY): Payer: BC Managed Care – PPO

## 2011-12-19 ENCOUNTER — Telehealth: Payer: Self-pay | Admitting: *Deleted

## 2011-12-19 NOTE — Telephone Encounter (Signed)
Dr Elease Hashimoto spoke with pt's OB GYN, Dr Elease Hashimoto happy to do an echo but pt was worked up in the past and felt it was not necessary unless symptomatic. Echo was cancelled by pt.

## 2011-12-19 NOTE — Telephone Encounter (Signed)
Message copied by Antony Odea on Tue Dec 19, 2011  4:23 PM ------      Message from: Mariane Masters D      Created: Tue Dec 19, 2011  3:25 PM      Regarding: ECHO       12/19/11 Patient cancel.

## 2011-12-20 ENCOUNTER — Other Ambulatory Visit (HOSPITAL_COMMUNITY): Payer: BC Managed Care – PPO

## 2012-01-05 ENCOUNTER — Encounter (HOSPITAL_COMMUNITY): Payer: Self-pay | Admitting: Pharmacist

## 2012-01-08 ENCOUNTER — Encounter (HOSPITAL_COMMUNITY): Payer: Self-pay

## 2012-01-08 ENCOUNTER — Encounter (HOSPITAL_COMMUNITY)
Admission: RE | Admit: 2012-01-08 | Discharge: 2012-01-08 | Disposition: A | Discharge status: Home / Self Care | Payer: BC Managed Care – PPO | Source: Ambulatory Visit | Attending: Obstetrics & Gynecology | Admitting: Obstetrics & Gynecology

## 2012-01-08 ENCOUNTER — Other Ambulatory Visit: Payer: Self-pay | Admitting: Obstetrics & Gynecology

## 2012-01-08 HISTORY — DX: Other complications of anesthesia, initial encounter: T88.59XA

## 2012-01-08 HISTORY — DX: Adverse effect of unspecified anesthetic, initial encounter: T41.45XA

## 2012-01-08 HISTORY — DX: Other specified pregnancy related conditions, unspecified trimester: O26.899

## 2012-01-08 HISTORY — DX: Heartburn: R12

## 2012-01-08 HISTORY — DX: Nausea with vomiting, unspecified: R11.2

## 2012-01-08 HISTORY — DX: Other specified postprocedural states: Z98.890

## 2012-01-08 LAB — CBC
HCT: 32.1 % — ABNORMAL LOW (ref 36.0–46.0)
MCH: 26.4 pg (ref 26.0–34.0)
MCHC: 32.1 g/dL (ref 30.0–36.0)
MCV: 82.3 fL (ref 78.0–100.0)
Platelets: 273 10*3/uL (ref 150–400)
RDW: 14.3 % (ref 11.5–15.5)

## 2012-01-08 MED ORDER — GENTAMICIN SULFATE 40 MG/ML IJ SOLN
INTRAVENOUS | Status: AC
  Administered 2012-01-09: 100 mL via INTRAVENOUS
  Filled 2012-01-08: qty 11.25

## 2012-01-08 NOTE — Patient Instructions (Signed)
Your procedure is scheduled on:01/09/12  Enter through the Main Entrance at 0915 am Pick up desk phone and dial [phone] and inform us of your arrival.  Please call [phone] if you have any problems the morning of surgery.  Remember: Do not eat or drink after midnight: tonight   Take these meds the morning of surgery with a sip of water:none  DO NOT wear jewelry, eye make-up, lipstick,body lotion, or dark fingernail polish. Do not shave for 48 hours prior to surgery.  If you are to be admitted after surgery, leave suitcase in car until your room has been assigned.

## 2012-01-08 NOTE — Pre-Procedure Instructions (Signed)
Patient denies allergy to Penicillin- states she is only allergic to Amoxicillin.I explained that Amoxicillin is a penicillin but she said she has taken other penicillins without problems.

## 2012-01-09 ENCOUNTER — Encounter (HOSPITAL_COMMUNITY): Payer: Self-pay | Admitting: *Deleted

## 2012-01-09 ENCOUNTER — Encounter (HOSPITAL_COMMUNITY)
Admission: RE | Disposition: A | Discharge status: Home / Self Care | Payer: Self-pay | Source: Ambulatory Visit | Attending: Obstetrics & Gynecology

## 2012-01-09 ENCOUNTER — Inpatient Hospital Stay (HOSPITAL_COMMUNITY)
Admission: RE | Admit: 2012-01-09 | Discharge: 2012-01-11 | DRG: 650 | Disposition: A | Discharge status: Home / Self Care | Payer: BC Managed Care – PPO | Source: Ambulatory Visit | Attending: Obstetrics & Gynecology | Admitting: Obstetrics & Gynecology

## 2012-01-09 ENCOUNTER — Inpatient Hospital Stay (HOSPITAL_COMMUNITY): Payer: BC Managed Care – PPO | Admitting: Anesthesiology

## 2012-01-09 ENCOUNTER — Encounter (HOSPITAL_COMMUNITY): Payer: Self-pay | Admitting: Anesthesiology

## 2012-01-09 DIAGNOSIS — O3660X0 Maternal care for excessive fetal growth, unspecified trimester, not applicable or unspecified: Secondary | ICD-10-CM | POA: Diagnosis present

## 2012-01-09 DIAGNOSIS — E669 Obesity, unspecified: Secondary | ICD-10-CM | POA: Diagnosis present

## 2012-01-09 DIAGNOSIS — D649 Anemia, unspecified: Secondary | ICD-10-CM | POA: Diagnosis present

## 2012-01-09 DIAGNOSIS — Z01812 Encounter for preprocedural laboratory examination: Secondary | ICD-10-CM

## 2012-01-09 DIAGNOSIS — I2510 Atherosclerotic heart disease of native coronary artery without angina pectoris: Secondary | ICD-10-CM | POA: Diagnosis present

## 2012-01-09 DIAGNOSIS — I059 Rheumatic mitral valve disease, unspecified: Secondary | ICD-10-CM | POA: Diagnosis present

## 2012-01-09 DIAGNOSIS — O1002 Pre-existing essential hypertension complicating childbirth: Principal | ICD-10-CM | POA: Diagnosis present

## 2012-01-09 DIAGNOSIS — Z98891 History of uterine scar from previous surgery: Secondary | ICD-10-CM

## 2012-01-09 DIAGNOSIS — O99214 Obesity complicating childbirth: Secondary | ICD-10-CM | POA: Diagnosis present

## 2012-01-09 DIAGNOSIS — Z302 Encounter for sterilization: Secondary | ICD-10-CM

## 2012-01-09 DIAGNOSIS — O34219 Maternal care for unspecified type scar from previous cesarean delivery: Secondary | ICD-10-CM | POA: Diagnosis present

## 2012-01-09 DIAGNOSIS — I341 Nonrheumatic mitral (valve) prolapse: Secondary | ICD-10-CM | POA: Diagnosis present

## 2012-01-09 DIAGNOSIS — Z148 Genetic carrier of other disease: Secondary | ICD-10-CM

## 2012-01-09 DIAGNOSIS — I447 Left bundle-branch block, unspecified: Secondary | ICD-10-CM | POA: Diagnosis present

## 2012-01-09 DIAGNOSIS — O9902 Anemia complicating childbirth: Secondary | ICD-10-CM | POA: Diagnosis present

## 2012-01-09 DIAGNOSIS — IMO0002 Reserved for concepts with insufficient information to code with codable children: Secondary | ICD-10-CM | POA: Diagnosis present

## 2012-01-09 DIAGNOSIS — O10919 Unspecified pre-existing hypertension complicating pregnancy, unspecified trimester: Secondary | ICD-10-CM | POA: Diagnosis present

## 2012-01-09 HISTORY — DX: Anemia, unspecified: D64.9

## 2012-01-09 HISTORY — DX: Reserved for concepts with insufficient information to code with codable children: IMO0002

## 2012-01-09 HISTORY — DX: Morbid (severe) obesity due to excess calories: E66.01

## 2012-01-09 HISTORY — DX: History of uterine scar from previous surgery: Z98.891

## 2012-01-09 HISTORY — DX: Unspecified pre-existing hypertension complicating pregnancy, unspecified trimester: O10.919

## 2012-01-09 LAB — COMPREHENSIVE METABOLIC PANEL
ALT: 8 U/L (ref 0–35)
Alkaline Phosphatase: 108 U/L (ref 39–117)
BUN: 11 mg/dL (ref 6–23)
Chloride: 103 mEq/L (ref 96–112)
GFR calc Af Amer: >90 mL/min (ref >90)
Glucose, Bld: 76 mg/dL (ref 70–99)
Potassium: 4.9 mEq/L (ref 3.5–5.1)
Sodium: 136 mEq/L (ref 135–145)
Total Bilirubin: 0.3 mg/dL (ref 0.3–1.2)
Total Protein: 6.2 g/dL (ref 6.0–8.3)

## 2012-01-09 SURGERY — Surgical Case
Anesthesia: Spinal | Site: Abdomen | Laterality: Bilateral | Wound class: Clean Contaminated

## 2012-01-09 MED ORDER — LACTATED RINGERS IV SOLN
40.0000 [IU] | INTRAVENOUS | Status: DC | PRN
  Administered 2012-01-09: 40 [IU] via INTRAVENOUS

## 2012-01-09 MED ORDER — KETOROLAC TROMETHAMINE 60 MG/2ML IM SOLN
INTRAMUSCULAR | Status: AC
  Administered 2012-01-09: 60 mg via INTRAMUSCULAR
  Filled 2012-01-09: qty 2

## 2012-01-09 MED ORDER — MEPERIDINE HCL 25 MG/ML IJ SOLN: 6.2500 mg | INTRAMUSCULAR | Status: DC | PRN

## 2012-01-09 MED ORDER — SIMETHICONE 80 MG PO CHEW
80.0000 mg | CHEWABLE_TABLET | Freq: Three times a day (TID) | ORAL | Status: DC
  Administered 2012-01-09 – 2012-01-11 (×7): 80 mg via ORAL

## 2012-01-09 MED ORDER — MAGNESIUM HYDROXIDE 400 MG/5ML PO SUSP: 30.0000 mL | ORAL | Status: DC | PRN

## 2012-01-09 MED ORDER — SENNOSIDES-DOCUSATE SODIUM 8.6-50 MG PO TABS
2.0000 | ORAL_TABLET | Freq: Every day | ORAL | Status: DC
  Administered 2012-01-09 – 2012-01-10 (×2): 2 via ORAL

## 2012-01-09 MED ORDER — SODIUM CHLORIDE 0.9 % IV SOLN
1.0000 ug/kg/h | INTRAVENOUS | Status: DC | PRN
  Filled 2012-01-09: qty 2.5

## 2012-01-09 MED ORDER — DEXAMETHASONE SODIUM PHOSPHATE 10 MG/ML IJ SOLN
INTRAMUSCULAR | Status: AC
  Filled 2012-01-09: qty 1

## 2012-01-09 MED ORDER — ONDANSETRON HCL 4 MG/2ML IJ SOLN: 4.0000 mg | Freq: Three times a day (TID) | INTRAMUSCULAR | Status: DC | PRN

## 2012-01-09 MED ORDER — KETOROLAC TROMETHAMINE 30 MG/ML IJ SOLN: 15.0000 mg | Freq: Once | INTRAMUSCULAR | Status: DC | PRN

## 2012-01-09 MED ORDER — SIMETHICONE 80 MG PO CHEW: 80.0000 mg | CHEWABLE_TABLET | ORAL | Status: DC | PRN

## 2012-01-09 MED ORDER — PRENATAL MULTIVITAMIN CH
1.0000 | ORAL_TABLET | Freq: Every day | ORAL | Status: DC
  Administered 2012-01-09 – 2012-01-11 (×3): 1 via ORAL
  Filled 2012-01-09 (×3): qty 1

## 2012-01-09 MED ORDER — LACTATED RINGERS IV SOLN: INTRAVENOUS | Status: DC

## 2012-01-09 MED ORDER — EPHEDRINE SULFATE 50 MG/ML IJ SOLN
INTRAMUSCULAR | Status: DC | PRN
  Administered 2012-01-09: 10 mg via INTRAVENOUS
  Administered 2012-01-09: 15 mg via INTRAVENOUS
  Administered 2012-01-09: 5 mg via INTRAVENOUS

## 2012-01-09 MED ORDER — TETANUS-DIPHTH-ACELL PERTUSSIS 5-2.5-18.5 LF-MCG/0.5 IM SUSP: 0.5000 mL | Freq: Once | INTRAMUSCULAR | Status: DC

## 2012-01-09 MED ORDER — FAMOTIDINE 20 MG PO TABS
20.0000 mg | ORAL_TABLET | Freq: Once | ORAL | Status: AC
  Administered 2012-01-09: 20 mg via ORAL

## 2012-01-09 MED ORDER — OXYTOCIN 10 UNIT/ML IJ SOLN
INTRAMUSCULAR | Status: AC
  Filled 2012-01-09: qty 4

## 2012-01-09 MED ORDER — NALOXONE HCL 0.4 MG/ML IJ SOLN: 0.4000 mg | INTRAMUSCULAR | Status: DC | PRN

## 2012-01-09 MED ORDER — SODIUM CHLORIDE 0.9 % IJ SOLN: 3.0000 mL | INTRAMUSCULAR | Status: DC | PRN

## 2012-01-09 MED ORDER — FENTANYL CITRATE 0.05 MG/ML IJ SOLN
INTRAMUSCULAR | Status: AC
  Filled 2012-01-09: qty 2

## 2012-01-09 MED ORDER — MENTHOL 3 MG MT LOZG: 1.0000 | LOZENGE | OROMUCOSAL | Status: DC | PRN

## 2012-01-09 MED ORDER — FAMOTIDINE 20 MG PO TABS
ORAL_TABLET | ORAL | Status: AC
  Administered 2012-01-09: 20 mg via ORAL
  Filled 2012-01-09: qty 1

## 2012-01-09 MED ORDER — PHENYLEPHRINE 40 MCG/ML (10ML) SYRINGE FOR IV PUSH (FOR BLOOD PRESSURE SUPPORT)
PREFILLED_SYRINGE | INTRAVENOUS | Status: AC
  Filled 2012-01-09: qty 25

## 2012-01-09 MED ORDER — FENTANYL CITRATE 0.05 MG/ML IJ SOLN
INTRAMUSCULAR | Status: DC | PRN
  Administered 2012-01-09: 100 ug via EPIDURAL

## 2012-01-09 MED ORDER — MORPHINE SULFATE 0.5 MG/ML IJ SOLN
INTRAMUSCULAR | Status: AC
  Filled 2012-01-09: qty 10

## 2012-01-09 MED ORDER — KETOROLAC TROMETHAMINE 60 MG/2ML IM SOLN
60.0000 mg | Freq: Once | INTRAMUSCULAR | Status: AC | PRN
  Administered 2012-01-09: 60 mg via INTRAMUSCULAR

## 2012-01-09 MED ORDER — DEXAMETHASONE SODIUM PHOSPHATE 4 MG/ML IJ SOLN: 8.0000 mg | Freq: Once | INTRAMUSCULAR | Status: DC | PRN

## 2012-01-09 MED ORDER — SCOPOLAMINE 1 MG/3DAYS TD PT72
MEDICATED_PATCH | TRANSDERMAL | Status: AC
  Administered 2012-01-09: 1.5 mg via TRANSDERMAL
  Filled 2012-01-09: qty 1

## 2012-01-09 MED ORDER — DEXAMETHASONE SODIUM PHOSPHATE 10 MG/ML IJ SOLN
INTRAMUSCULAR | Status: DC | PRN
  Administered 2012-01-09: 10 mg via INTRAVENOUS

## 2012-01-09 MED ORDER — ZOLPIDEM TARTRATE 5 MG PO TABS: 5.0000 mg | ORAL_TABLET | Freq: Every evening | ORAL | Status: DC | PRN

## 2012-01-09 MED ORDER — KETOROLAC TROMETHAMINE 30 MG/ML IJ SOLN: 30.0000 mg | Freq: Four times a day (QID) | INTRAMUSCULAR | Status: AC | PRN

## 2012-01-09 MED ORDER — LACTATED RINGERS IV SOLN
INTRAVENOUS | Status: DC
  Administered 2012-01-09: 17:00:00 via INTRAVENOUS

## 2012-01-09 MED ORDER — ONDANSETRON HCL 4 MG/2ML IJ SOLN: 4.0000 mg | INTRAMUSCULAR | Status: DC | PRN

## 2012-01-09 MED ORDER — IBUPROFEN 600 MG PO TABS
600.0000 mg | ORAL_TABLET | Freq: Four times a day (QID) | ORAL | Status: DC
  Administered 2012-01-10 – 2012-01-11 (×7): 600 mg via ORAL
  Filled 2012-01-09 (×7): qty 1

## 2012-01-09 MED ORDER — KETOROLAC TROMETHAMINE 30 MG/ML IJ SOLN
30.0000 mg | Freq: Four times a day (QID) | INTRAMUSCULAR | Status: AC | PRN
  Administered 2012-01-09: 30 mg via INTRAVENOUS
  Filled 2012-01-09: qty 1

## 2012-01-09 MED ORDER — DIPHENHYDRAMINE HCL 25 MG PO CAPS: 25.0000 mg | ORAL_CAPSULE | Freq: Four times a day (QID) | ORAL | Status: DC | PRN

## 2012-01-09 MED ORDER — ONDANSETRON HCL 4 MG/2ML IJ SOLN
INTRAMUSCULAR | Status: DC | PRN
  Administered 2012-01-09: 4 mg via INTRAVENOUS

## 2012-01-09 MED ORDER — OXYCODONE-ACETAMINOPHEN 5-325 MG PO TABS
1.0000 | ORAL_TABLET | ORAL | Status: DC | PRN
  Administered 2012-01-11 (×2): 1 via ORAL
  Filled 2012-01-09 (×2): qty 1

## 2012-01-09 MED ORDER — SODIUM BICARBONATE 8.4 % IV SOLN
INTRAVENOUS | Status: DC | PRN
  Administered 2012-01-09: 5 mL via EPIDURAL

## 2012-01-09 MED ORDER — LANOLIN HYDROUS EX OINT: 1.0000 "application " | TOPICAL_OINTMENT | CUTANEOUS | Status: DC | PRN

## 2012-01-09 MED ORDER — BUPIVACAINE IN DEXTROSE 0.75-8.25 % IT SOLN
INTRATHECAL | Status: DC | PRN
  Administered 2012-01-09: 1.5 mL via INTRATHECAL

## 2012-01-09 MED ORDER — MORPHINE SULFATE (PF) 0.5 MG/ML IJ SOLN
INTRAMUSCULAR | Status: DC | PRN
  Administered 2012-01-09: 3 mg via EPIDURAL

## 2012-01-09 MED ORDER — BUPIVACAINE HCL (PF) 0.25 % IJ SOLN
INTRAMUSCULAR | Status: DC | PRN
  Administered 2012-01-09: 10 mL

## 2012-01-09 MED ORDER — FERROUS SULFATE 325 (65 FE) MG PO TABS: 325.0000 mg | ORAL_TABLET | Freq: Every day | ORAL | Status: DC

## 2012-01-09 MED ORDER — ASPIRIN 81 MG PO CHEW
81.0000 mg | CHEWABLE_TABLET | Freq: Every day | ORAL | Status: DC
  Administered 2012-01-09 – 2012-01-11 (×3): 81 mg via ORAL
  Filled 2012-01-09 (×4): qty 1

## 2012-01-09 MED ORDER — FENTANYL CITRATE 0.05 MG/ML IJ SOLN: 25.0000 ug | INTRAMUSCULAR | Status: DC | PRN

## 2012-01-09 MED ORDER — SCOPOLAMINE 1 MG/3DAYS TD PT72
1.0000 | MEDICATED_PATCH | Freq: Once | TRANSDERMAL | Status: DC
  Administered 2012-01-09: 1.5 mg via TRANSDERMAL

## 2012-01-09 MED ORDER — WITCH HAZEL-GLYCERIN EX PADS
1.0000 "application " | MEDICATED_PAD | CUTANEOUS | Status: DC | PRN
  Administered 2012-01-10: 1 via TOPICAL

## 2012-01-09 MED ORDER — EPHEDRINE 5 MG/ML INJ
INTRAVENOUS | Status: AC
  Filled 2012-01-09: qty 10

## 2012-01-09 MED ORDER — ONDANSETRON HCL 4 MG PO TABS: 4.0000 mg | ORAL_TABLET | ORAL | Status: DC | PRN

## 2012-01-09 MED ORDER — ONDANSETRON HCL 4 MG/2ML IJ SOLN
INTRAMUSCULAR | Status: AC
  Filled 2012-01-09: qty 2

## 2012-01-09 MED ORDER — BUPIVACAINE HCL (PF) 0.25 % IJ SOLN
INTRAMUSCULAR | Status: AC
  Filled 2012-01-09: qty 30

## 2012-01-09 MED ORDER — LACTATED RINGERS IV SOLN
INTRAVENOUS | Status: DC | PRN
  Administered 2012-01-09 (×4): via INTRAVENOUS

## 2012-01-09 MED ORDER — 0.9 % SODIUM CHLORIDE (POUR BTL) OPTIME
TOPICAL | Status: DC | PRN
  Administered 2012-01-09: 1000 mL

## 2012-01-09 MED ORDER — PHENYLEPHRINE HCL 10 MG/ML IJ SOLN
INTRAMUSCULAR | Status: DC | PRN
  Administered 2012-01-09: 80 ug via INTRAVENOUS
  Administered 2012-01-09: 40 ug via INTRAVENOUS
  Administered 2012-01-09 (×3): 80 ug via INTRAVENOUS
  Administered 2012-01-09 (×3): 40 ug via INTRAVENOUS
  Administered 2012-01-09 (×2): 120 ug via INTRAVENOUS
  Administered 2012-01-09 (×2): 80 ug via INTRAVENOUS

## 2012-01-09 MED ORDER — OXYTOCIN 40 UNITS IN LACTATED RINGERS INFUSION - SIMPLE MED: 62.5000 mL/h | INTRAVENOUS | Status: AC

## 2012-01-09 MED ORDER — DIBUCAINE 1 % RE OINT
1.0000 "application " | TOPICAL_OINTMENT | RECTAL | Status: DC | PRN
  Administered 2012-01-10: 1 via RECTAL
  Filled 2012-01-09: qty 28

## 2012-01-09 SURGICAL SUPPLY — 37 items
CLOTH BEACON ORANGE TIMEOUT ST (SAFETY) ×2 IMPLANT
CONTAINER PREFILL 10% NBF 15ML (MISCELLANEOUS) ×4 IMPLANT
DRAPE SURG 17X23 STRL (DRAPES) IMPLANT
DRESSING TELFA 8X3 (GAUZE/BANDAGES/DRESSINGS) ×2 IMPLANT
DRSG COVADERM 4X10 (GAUZE/BANDAGES/DRESSINGS) ×2 IMPLANT
DURAPREP 26ML APPLICATOR (WOUND CARE) ×2 IMPLANT
ELECT REM PT RETURN 9FT ADLT (ELECTROSURGICAL) ×2
ELECTRODE REM PT RTRN 9FT ADLT (ELECTROSURGICAL) ×1 IMPLANT
EXTRACTOR VACUUM M CUP 4 TUBE (SUCTIONS) IMPLANT
GLOVE BIO SURGEON STRL SZ 6.5 (GLOVE) ×6 IMPLANT
GLOVE BIOGEL PI IND STRL 7.0 (GLOVE) ×3 IMPLANT
GLOVE BIOGEL PI INDICATOR 7.0 (GLOVE) ×3
GOWN PREVENTION PLUS LG XLONG (DISPOSABLE) ×8 IMPLANT
KIT ABG SYR 3ML LUER SLIP (SYRINGE) IMPLANT
NEEDLE HYPO 22GX1.5 SAFETY (NEEDLE) ×2 IMPLANT
NEEDLE HYPO 25X5/8 SAFETYGLIDE (NEEDLE) ×2 IMPLANT
PACK C SECTION WH (CUSTOM PROCEDURE TRAY) ×2 IMPLANT
PAD ABD 7.5X8 STRL (GAUZE/BANDAGES/DRESSINGS) ×2 IMPLANT
PAD OB MATERNITY 4.3X12.25 (PERSONAL CARE ITEMS) ×2 IMPLANT
RTRCTR C-SECT PINK 25CM LRG (MISCELLANEOUS) ×2 IMPLANT
SLEEVE SCD COMPRESS KNEE MED (MISCELLANEOUS) IMPLANT
SPONGE GAUZE 4X4 12PLY (GAUZE/BANDAGES/DRESSINGS) ×2 IMPLANT
STAPLER VISISTAT 35W (STAPLE) ×2 IMPLANT
SUT PLAIN 0 NONE (SUTURE) ×2 IMPLANT
SUT VIC AB 0 CT1 27 (SUTURE) ×2
SUT VIC AB 0 CT1 27XBRD ANBCTR (SUTURE) ×2 IMPLANT
SUT VIC AB 0 CTX 36 (SUTURE) ×2
SUT VIC AB 0 CTX36XBRD ANBCTRL (SUTURE) ×2 IMPLANT
SUT VIC AB 2-0 CT1 27 (SUTURE) ×1
SUT VIC AB 2-0 CT1 TAPERPNT 27 (SUTURE) ×1 IMPLANT
SUT VIC AB 3-0 SH 27 (SUTURE)
SUT VIC AB 3-0 SH 27X BRD (SUTURE) IMPLANT
SYR CONTROL 10ML LL (SYRINGE) ×2 IMPLANT
TAPE CLOTH SURG 4X10 WHT LF (GAUZE/BANDAGES/DRESSINGS) ×2 IMPLANT
TOWEL OR 17X24 6PK STRL BLUE (TOWEL DISPOSABLE) ×4 IMPLANT
TRAY FOLEY CATH 14FR (SET/KITS/TRAYS/PACK) ×2 IMPLANT
WATER STERILE IRR 1000ML POUR (IV SOLUTION) IMPLANT

## 2012-01-09 NOTE — Consult Note (Signed)
Neonatology Note:   Attendance at C-section:    I was asked to attend this repeat C/S at term. The mother is a G4P1A2 A pos, GBS neg with obesity, chronic hypertension, a history of depression, and abnormal 1 hour glucose tolerance with 1 of 3 abnormal on 3-hour GTT. There is known fetal macrosomia. ROM at delivery, fluid with moderate mecvonium. Infant vigorous with good spontaneous cry and tone. Needed only minimal bulb suctioning. Ap 9/9. Lungs clear to ausc in DR. Baby does not appear LGA. To CN to care of Pediatrician.   Deatra James, MD

## 2012-01-09 NOTE — H&P (Signed)
[name] is a 36 y.o. female [phone] [redacted]w[redacted]d presenting for Elective Repeat C/S and BT/S Modified [place].  OB History    Grav Para Term Preterm Abortions TAB SAB Ect Mult Living   4 1 1  0 2 0 2 0 0 1     Past Medical History  Diagnosis Date  . Obesity   . Anemia   . Depression     history of depression- none now  . 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 Heartburn in pregnancy   . Complication of anesthesia     pt states she was given too much anesthesia   . PONV (postoperative nausea and vomiting)    Past Surgical History  Procedure Date  . Gastric bypass     status Post  . Cardiac catheterization 11-07-2002    The left anterior descending artery is smooth and normal. There are several moderate sized diagonal branch which are normal. The left circumflex artery is normal. It is a very large vessel. The right coronary artery is large and dominant. EF 65%. Smooth and normal coronary arteries. Normal left ventricular systolic function.   . Cardiovascular stress test 2004    revealed anterior apical ischemia.  . Cesarean section    Family History: family history is not on file. Social History:  reports that she has never smoked. She does not have any smokeless tobacco history on file. She reports that she does not drink alcohol or use illicit drugs. Current facility-administered medications:[COMPLETED] famotidine (PEPCID) tablet 20 mg, 20 mg, Oral, Once, Velna Hatchet, MD, 20 mg at 01/09/12 0951;  gentamicin (GARAMYCIN) 450 mg, clindamycin (CLEOCIN) 900 mg in dextrose 5 % 100 mL IVPB, , Intravenous, On Call to OR, Genia Del, MD;  lactated ringers infusion, , Intravenous, Continuous, Velna Hatchet, MD scopolamine (TRANSDERM-SCOP) 1.5 MG 1.5 mg, 1 patch, Transdermal, Once, Velna Hatchet, MD, 1.5 mg at 01/09/12 9811 Allergies  Allergen Reactions  . Amoxicillin Hives      Blood pressure 153/93, pulse 88, temperature 98.2 F (36.8  C), temperature source Oral, resp. rate 18, height 5\' 4"  (1.626 m), weight 158.759 kg (350 lb), last menstrual period 04/06/2011, SpO2 100.00%.  LGA per [facility] FHT 134/min  HPP:  Patient Active Problem List  Diagnosis  . LBBB (left bundle branch block)  . MVP (mitral valve prolapse)  . Pregnancy    Prenatal labs: ABO, Rh: --/--/A POS (11/11 1201) Antibody: NEG (11/11 1201) Rubella:  Immune RPR: NON REACTIVE (11/11 1201)  HBsAg: Negative (04/25 0000)  HIV: Non-reactive (04/25 0000)  Genetic testing: Declined [place] anato: wnl 1 hr GTT: abnormal.  3 hr GTT 1 abnormal value GBS:  neg  Assessment/Plan: 39+ wks previous C/S, suspicion of macrosomia, mild cHTN, mild ASxic MVP, LBBB, MTHFR, Obesity. Desire for Sterilization. Surgery and risks reviewed for repeat C/S with BT/S Modified [place].   Bracken Moffa,MARIE-LYNE 01/09/2012, 10:15 AM

## 2012-01-09 NOTE — Anesthesia Preprocedure Evaluation (Signed)
Anesthesia Evaluation  Patient identified by MRN, date of birth, ID band Patient awake    Reviewed: Allergy & Precautions, H&P , NPO status , Patient's Chart, lab work & pertinent test results  Airway Mallampati: III TM Distance: >3 FB Neck ROM: full    Dental No notable dental hx.    Pulmonary neg pulmonary ROS,    Pulmonary exam normal       Cardiovascular - CAD     Neuro/Psych PSYCHIATRIC DISORDERS Depression negative neurological ROS  negative psych ROS   GI/Hepatic negative GI ROS, Neg liver ROS,   Endo/Other  Morbid obesity  Renal/GU negative Renal ROS  negative genitourinary   Musculoskeletal negative musculoskeletal ROS (+)   Abdominal (+) + obese,   Peds negative pediatric ROS (+)  Hematology negative hematology ROS (+)   Anesthesia Other Findings   Reproductive/Obstetrics (+) Pregnancy                           Anesthesia Physical Anesthesia Plan  ASA: III  Anesthesia Plan: Combined Spinal and Epidural   Post-op Pain Management:    Induction:   Airway Management Planned:   Additional Equipment:   Intra-op Plan:   Post-operative Plan:   Informed Consent: I have reviewed the patients History and Physical, chart, labs and discussed the procedure including the risks, benefits and alternatives for the proposed anesthesia with the patient or authorized representative who has indicated his/her understanding and acceptance.     Plan Discussed with: CRNA and Surgeon  Anesthesia Plan Comments:         Anesthesia Quick Evaluation

## 2012-01-09 NOTE — Op Note (Signed)
Preoperative diagnosis: Intrauterine pregnancy at 39 weeks and 5 days, previous C/S                                            Sterilization  Post operative diagnosis: Same  Anesthesia: Spinal  Anesthesiologist: Dr. Genia Del  Procedure: Repeat low transverse cesarean section and bilateral tubal sterilization by modified P[place]oy procedure  Surgeon: Dr. Genia Del  Assistant: Arlan Organ   Estimated blood loss: 1000 cc  Procedure:  After being informed of the planned procedure and possible complications including bleeding, infection, injury to other organs, informed consent is obtained. The patient is taken to OR #9 and given spinal anesthesia without complication. She is placed in the dorsal decubitus position with the pelvis tilted to the left. She is then prepped and draped in a sterile fashion. A Foley catheter is inserted in her bladder.  After assessing adequate level of anesthesia, we infiltrate the suprapubic area with 20 cc of Marcaine 0.25 and perform a Pfannenstiel incision which is brought down sharply to the fascia. The fascia is entered in a low transverse fashion. Linea alba is dissected. Peritoneum is entered in a midline fashion. An Alexis retractor is easily positioned. Visceral peritoneum is entered in a low transverse fashion allowing [place] to safely retract bladder by developing a bladder flap.  The myometrium is then entered in a low transverse fashion; first with knife and then extended bluntly. Amniotic fluid is meconium. We assist the birth of a female  infant in Cephalic presentation. Mouth and nose are suctioned. The baby is delivered. The cord is clamped and sectioned. The baby is given to the neonatologist present in the room.  Apgars are 9-9.  10 cc of blood is drawn from the umbilical vein.The placenta is allowed to deliver spontaneously. It is complete and the cord has 3 vessels. Uterine revision is negative.  We proceed with closure of the  myometrium in 2 layers: First with a running locked suture of 0 Vicryl, then with a Lembert suture of 0 Vicryl imbricating the first one. Hemostasis is completed with cauterization on peritoneal edges.  Both paracolic gutters are cleaned. Both tubes and ovaries are assessed and normal.  We then proceed with a bilateral Modified [place] sterilization.  The left tube was grasped with a Babcock.  The mesosalpinx is opened with the electrocautery.  A plain suture is used to lyse suture at the proximal aspect of the left tube at about 2 cm from the cornua. And other plain is used to suture the tube on distally about 1.5 cm from the previous suture. The left portion of the tube was cut in between those 2 sutures and sent to pathology. We cauterize both tips of the cut tube. We proceed exactly the same way on the right side.  The pelvis is profusely irrigated with warm saline to confirm a satisfactory hemostasis.  The parietal peritoneum is closed with a running suture of Vicryl 2-0.  Retractors and sponges are removed. Under fascia hemostasis is completed with cauterization. The fascia is then closed with 2 running sutures of 0 Vicryl meeting midline. The wound is irrigated with warm saline and hemostasis is completed with cauterization.  The adipose tissue was reapproximated with a running suture of plain. The skin is closed with staples.  Instrument and sponge count is complete x2. Estimated blood loss is 1000 cc.  The procedure is well tolerated by the patient who is taken to recovery room in a well and stable condition.  female baby named [place] was born at 11:37 and received an Apgar of 9  at 1 minute and 9 at 5 minutes. Weight was pending.    Specimen: Placenta sent to [facility] Cheryle Horsfall MD 11/12/201312:18 PM

## 2012-01-09 NOTE — Transfer of Care (Signed)
Immediate Anesthesia Transfer of Care Note  Patient: [name]  Procedure(s) Performed: Procedure(s) (LRB) with comments: CESAREAN SECTION WITH BILATERAL TUBAL LIGATION (Bilateral) - Repeat C/S   EDD: 01/11/12  Patient Location: PACU  Anesthesia Type:Spinal and Epidural  Level of Consciousness: awake, alert  and oriented  Airway & Oxygen Therapy: Patient Spontanous Breathing  Post-op Assessment: Report given to PACU RN and Post -op Vital signs reviewed and stable  Post vital signs: stable  Complications: No apparent anesthesia complications

## 2012-01-09 NOTE — Anesthesia Postprocedure Evaluation (Signed)
Anesthesia Post Note  Patient: [name]  Procedure(s) Performed: Procedure(s) (LRB): CESAREAN SECTION WITH BILATERAL TUBAL LIGATION (Bilateral)  Anesthesia type: Spinal  Patient location: PACU  Post pain: Pain level controlled  Post assessment: Post-op Vital signs reviewed  Last Vitals:  Filed Vitals:   01/09/12 1330  BP: 136/50  Pulse: 79  Temp: 36.4 C  Resp: 19    Post vital signs: Reviewed  Level of consciousness: awake  Complications: No apparent anesthesia complications

## 2012-01-09 NOTE — Anesthesia Procedure Notes (Signed)
Spinal  Patient location during procedure: OR Start time: 01/09/2012 11:00 AM End time: 01/09/2012 11:05 AM Staffing Anesthesiologist: Sandrea Hughs Performed by: anesthesiologist  Preanesthetic Checklist Completed: patient identified, site marked, surgical consent, pre-op evaluation, timeout performed, IV checked, risks and benefits discussed and monitors and equipment checked Spinal Block Patient position: sitting Prep: DuraPrep Patient monitoring: cardiac monitor, continuous pulse ox, blood pressure and heart rate Approach: midline Location: L3-4 Injection technique: catheter Needle Needle type: Tuohy and Sprotte  Needle gauge: 24 G Needle length: 12.7 cm Needle insertion depth: 9 cm Catheter type: closed end flexible Catheter size: 19 g Catheter at skin depth: 15 cm Assessment Sensory level: T10

## 2012-01-10 ENCOUNTER — Encounter (HOSPITAL_COMMUNITY): Payer: Self-pay

## 2012-01-10 DIAGNOSIS — IMO0002 Reserved for concepts with insufficient information to code with codable children: Secondary | ICD-10-CM

## 2012-01-10 DIAGNOSIS — O24419 Gestational diabetes mellitus in pregnancy, unspecified control: Secondary | ICD-10-CM | POA: Insufficient documentation

## 2012-01-10 DIAGNOSIS — O10919 Unspecified pre-existing hypertension complicating pregnancy, unspecified trimester: Secondary | ICD-10-CM

## 2012-01-10 DIAGNOSIS — Z98891 History of uterine scar from previous surgery: Secondary | ICD-10-CM

## 2012-01-10 HISTORY — DX: Reserved for concepts with insufficient information to code with codable children: IMO0002

## 2012-01-10 HISTORY — DX: History of uterine scar from previous surgery: Z98.891

## 2012-01-10 HISTORY — DX: Morbid (severe) obesity due to excess calories: E66.01

## 2012-01-10 HISTORY — DX: Unspecified pre-existing hypertension complicating pregnancy, unspecified trimester: O10.919

## 2012-01-10 LAB — CBC
MCH: 26.8 pg (ref 26.0–34.0)
MCHC: 32.6 g/dL (ref 30.0–36.0)
MCV: 82.1 fL (ref 78.0–100.0)
Platelets: 260 10*3/uL (ref 150–400)
RDW: 14.5 % (ref 11.5–15.5)

## 2012-01-10 MED ORDER — POLYSACCHARIDE IRON COMPLEX 150 MG PO CAPS
150.0000 mg | ORAL_CAPSULE | Freq: Two times a day (BID) | ORAL | Status: DC
  Administered 2012-01-10 – 2012-01-11 (×3): 150 mg via ORAL
  Filled 2012-01-10 (×5): qty 1

## 2012-01-10 MED ORDER — DOCUSATE SODIUM 100 MG PO CAPS
100.0000 mg | ORAL_CAPSULE | Freq: Two times a day (BID) | ORAL | Status: DC
Start: 2012-01-10 — End: 2012-01-11
  Administered 2012-01-10 – 2012-01-11 (×3): 100 mg via ORAL
  Filled 2012-01-10 (×3): qty 1

## 2012-01-10 NOTE — Addendum Note (Signed)
Addendum  created 01/10/12 0905 by Elbert Ewings, CRNA   Modules edited:Anesthesia LDA

## 2012-01-10 NOTE — Addendum Note (Signed)
Addendum  created 01/10/12 0907 by Elbert Ewings, CRNA   Modules edited:Notes Section

## 2012-01-10 NOTE — Progress Notes (Signed)
Subjective: POD# 1 Information for the patient's newborn:  Verdine, Grenfell [[id number]]  female   Reports feeling well, desires early DC. Feeding: breast Patient reports tolerating PO.  Breast symptoms: difficulty latching on L side Pain controlled with Motrin and Percocet. Denies HA/SOB/C/P/N/V/dizziness. Flatus absent. She reports vaginal bleeding as normal, without clots.  She has ambulated x 1, no void yet, foley cath out 2 hrs ago. Declines Flu vaccine, OK w/ TDaP.     Objective:   VS:  Filed Vitals:   01/09/12 2340 01/10/12 0140 01/10/12 0440 01/10/12 0632  BP: 123/73 126/82 132/74 118/86  Pulse: 93 85 92 85  Temp: 97.6 F (36.4 C) 97.7 F (36.5 C) 97.6 F (36.4 C) 98.4 F (36.9 C)  TempSrc: Oral Oral Oral Oral  Resp: 18 18 18 18   Height:      Weight:      SpO2: 97% 97% 97% 98%     Intake/Output Summary (Last 24 hours) at 01/10/12 0829 Last data filed at 01/10/12 0600  Gross per 24 hour  Intake 4562.5 ml  Output   2850 ml  Net 1712.5 ml        Basename 01/10/12 0515 01/08/12 1201  WBC 13.9* 8.3  HGB 7.5* 10.3*  HCT 23.0* 32.1*  PLT 260 273     Blood type: --/--/A POS (11/11 1201)  Rubella:   immune    Physical Exam:  General: alert, cooperative and no distress CV: Regular rate and rhythm Resp: Mild crackles RL base Abdomen:  morbidly obese, + panus, active [id number] Incision: clean, dry, intact and pressure dressing in place Uterine Fundus: firm, below umbilicus, nontender Lochia: moderate Ext: edema +1 pedal and Homans sign is negative, no sign of DVT, SCD's in place      Assessment/Plan: 36 y.o.   POD# 1. [id number]                  Active Problems:  S/P cesarean section (11/12, rpt & BTL, suspect macrosomia)  LBBB (left bundle branch block)  MVP (mitral valve prolapse)  Postpartum care following cesarean delivery  Chronic hypertension complicating or reason for care during pregnancy  Maternal anemia complicating pregnancy, childbirth, or  the puerperium  Morbidly obese  Doing well, stable.     ABL anemia asymptomatic, will rpt CBC in am Start oral Fe and colce           Advance diet as tolerated Ambulate Routine post-op care Lactation support Anticipate discharge home in AM.   PAUL,DANIELA 01/10/2012, 8:29 AM

## 2012-01-10 NOTE — Addendum Note (Signed)
Addendum  created 01/10/12 0905 by Modine Oppenheimer S Sanaiya Welliver, CRNA   Modules edited:Anesthesia LDA    

## 2012-01-10 NOTE — Anesthesia Postprocedure Evaluation (Signed)
  Anesthesia Post-op Note  Patient: [name]  Procedure(s) Performed: Procedure(s) (LRB) with comments: CESAREAN SECTION WITH BILATERAL TUBAL LIGATION (Bilateral) - Repeat C/S   EDD: 01/11/12  Patient Location: PACU and Mother/Baby  Anesthesia Type:Spinal and Epidural  Level of Consciousness: awake, alert  and oriented  Airway and Oxygen Therapy: Patient Spontanous Breathing  Post-op Pain: mild  Post-op Assessment: Post-op Vital signs reviewed, Patient's Cardiovascular Status Stable, Respiratory Function Stable, No signs of Nausea or vomiting, Adequate PO intake, Pain level controlled, No headache, No backache, No residual numbness and No residual motor weakness  Post-op Vital Signs: stable  Complications: No apparent anesthesia complications

## 2012-01-11 ENCOUNTER — Encounter (HOSPITAL_COMMUNITY): Payer: Self-pay | Admitting: *Deleted

## 2012-01-11 DIAGNOSIS — D649 Anemia, unspecified: Secondary | ICD-10-CM

## 2012-01-11 DIAGNOSIS — Z148 Genetic carrier of other disease: Secondary | ICD-10-CM

## 2012-01-11 LAB — CBC
HCT: 21.8 % — ABNORMAL LOW (ref 36.0–46.0)
MCHC: 32.1 g/dL (ref 30.0–36.0)
Platelets: 218 10*3/uL (ref 150–400)
RDW: 14.8 % (ref 11.5–15.5)
WBC: 10.3 10*3/uL (ref 4.0–10.5)

## 2012-01-11 MED ORDER — MEASLES, MUMPS & RUBELLA VAC ~~LOC~~ INJ
0.5000 mL | INJECTION | Freq: Once | SUBCUTANEOUS | Status: DC
  Filled 2012-01-11: qty 0.5

## 2012-01-11 MED ORDER — POLYSACCHARIDE IRON COMPLEX 150 MG PO CAPS: 150.0000 mg | ORAL_CAPSULE | Freq: Two times a day (BID) | ORAL | Status: DC

## 2012-01-11 MED ORDER — IBUPROFEN 600 MG PO TABS: 600.0000 mg | ORAL_TABLET | Freq: Four times a day (QID) | ORAL | Status: DC | PRN

## 2012-01-11 MED ORDER — OXYCODONE-ACETAMINOPHEN 5-325 MG PO TABS: 1.0000 | ORAL_TABLET | ORAL | Status: DC | PRN

## 2012-01-11 MED ORDER — HYDROCORTISONE 2.5 % RE CREA: TOPICAL_CREAM | Freq: Three times a day (TID) | RECTAL | Status: DC

## 2012-01-11 MED ORDER — HYDROCORTISONE 2.5 % RE CREA
TOPICAL_CREAM | Freq: Three times a day (TID) | RECTAL | Status: DC
  Filled 2012-01-11: qty 28.35

## 2012-01-11 NOTE — Progress Notes (Signed)
Subjective: Postpartum Day 2: Cesarean Delivery (RLTCS)  Patient reports tolerating PO, + flatus and + BM.  No problems voiding but c/o painful hemorrhoid x 1  no complaints, up ad lib without syncope Pain well controlled with po meds BF: on demand and cluster feeding Mood stable, bonding well   Objective: Vital signs in last 24 hours: Temp:  [97.8 F (36.6 C)-98.3 F (36.8 C)] 98.1 F (36.7 C) (11/14 0855) Pulse Rate:  [89-99] 89  (11/14 0855) Resp:  [18-20] 20  (11/14 0855) BP: (113-122)/(67-81) 122/81 mmHg (11/14 0515) SpO2:  [99 %] 99 % (11/14 0855)  I/O last 3 completed shifts: In: 2880 [P.O.:2880] Out: 2350 [Urine:2350] Total I/O In: 240 [P.O.:240] Out: 350 [Urine:350]    Physical Exam:  General: alert, cooperative, no distress, morbidly obese and pale (Chronic Anemia) Breasts: nipples slightly tender as baby is cluster feeding. Advised to seal area with Colostrum and may also apply Lanolin cream to the nipple area but to wipe off prior to feeding. Lactation consult today.  Heart: RRR Lungs: CTAB Abdomen: BS x 4 - also had BM. Uterine Fundus: firm Incision: healing well, no significant drainage, no dehiscence, no significant erythema, dressing is off and patient has a  White cotton wash cloth placed over incision. Staples to remain for 7 days Post - op. To return to office for R/o.  Lochia: appropriate DVT Evaluation: No evidence of DVT seen on physical exam. Negative Homan's sign. No cords or calf tenderness. Calf/Ankle edema is present. +3 to feet bilaterally and +4 to ankles and lower calf area Bilaterally Encouraged ambulation and deep breathing exercises while in bed. Patient is anxious for discharge to home. Will advised to remain until POD 3 before d/c home.   Basename 01/11/12 0535 01/10/12 0515  HGB 7.0* 7.5*  HCT 21.8* 23.0*  Hx of Chronic Anemia. Normal Hgb : 8.0.  Asymptomatic.The patient is taking po iron on a continuous basis. Advised to continue  po iron and PNV.  Assessment/Plan: Status post Cesarean section. Doing well postoperatively.  Continue current care D/c in am. To remain until POD 3 To have Tdap prior to d/c home.  Timara Loma, CNM. 01/11/2012, 9:26 AM

## 2012-01-11 NOTE — Discharge Summary (Signed)
Obstetric Discharge Summary Reason for Admission: cesarean section Prenatal Procedures: ultrasound and Has cardiac co- morbidities and dis not f/u with cardiology as requested. Intrapartum Procedures: cesarean: low cervical, transverse Postpartum Procedures: None Complications-Operative and Postpartum: none and surveillance of co- morbdities of CHTN, Left Bundle Branch Block, Mitral Valve Prolapse.  Morbid Obesity,Chronic Anemia. Hemoglobin  Date Value Range Status  01/11/2012 7.0* 12.0 - 15.0 g/dL Final     HCT  Date Value Range Status  01/11/2012 21.8* 36.0 - 46.0 % Final    Physical Exam:  General: alert, cooperative, fatigued, no distress and pale Lochia: appropriate Uterine Fundus: firm Incision: healing well, no significant drainage, no dehiscence, no significant erythema, R/o of staples at POD #7 DVT Evaluation: No evidence of DVT seen on physical exam. Negative Homan's sign. No cords or calf tenderness. No significant calf/ankle edema. Calf/Ankle edema is present.   Discharge Diagnoses: Term Pregnancy-delivered and RLTCS - Co Morbidities Surveillance  Discharge Information: Date: 01/11/2012 Activity: pelvic rest and encourage ambulation due to co- morbidities Diet: routine Medications: PNV, Ibuprofen, Iron, Percocet and  Condition: stable Instructions: refer to practice specific booklet Discharge to: home Follow-up Information    Follow up with [facility] Schedule an appointment as soon as possible for a visit in 7 days. (R/o Staples)    Contact information:   [address] [place] [address] [phone]       Patient to return to [facility] POD # 7 for r/o stables Advised to call if any issues. Patient had been advised to remain until am but was determined for d/c to home this evening. Arranged d/c home.  Newborn Data: Live born female  Birth Weight: 7 lb 11.6 oz (3505 g) APGAR: 9, 9  Home with  mother.  Taiyana Kissler, CNM. 01/11/2012, 4:15 PM

## 2012-01-11 NOTE — Progress Notes (Addendum)
Plan discharge POSTOP day #3  (tomorrow) due to multiple co-morbidities / need to ambulate today. Staple removal at [facility] on post-op day # 7   Marlinda Mike CNM, MSN  Addendum:  S: Call to office that the patient is unwilling to stay tonight and desires pm discharge although has been advised stay until the am O: patient appears to be stable. Has been out ambulating and requested to be discharged to home. A: POD #2 , Now 52 hrs post-op RLTCS but has hx of co morbidities of cardiac issues and morbid obesity.     Condition stable and I&O balanced and stable P: Plan for discharge this PM and to f/u with office if any issues. Confirmed that the patient will return to office POD # 7      For stable r/o as per plan for RLTCS and obesity issues.     Care as per [facility] Handbook.  Earl Gala, CNM.

## 2012-01-11 NOTE — Plan of Care (Signed)
Problem: Discharge Progression Outcomes Goal: Remove staples per MD order Outcome: Not Applicable Date Met:  01/11/12 To be done in office. Goal: MMR given as ordered Outcome: Not Applicable Date Met:  01/11/12 Rubella immune

## 2012-01-12 LAB — TYPE AND SCREEN: Unit division: 0

## 2012-01-12 NOTE — Progress Notes (Signed)
Sw referral received yesterday however pt discharged, (early) before this CSW could assess history of depression today.   

## 2012-06-04 ENCOUNTER — Telehealth: Payer: Self-pay | Admitting: Cardiovascular Disease

## 2012-06-04 NOTE — Telephone Encounter (Signed)
New problem   Pt stated she is having an achy feeling in her chest and both arms for a few weeks. Pt want to be seen very soon to see a doctor. Please call pt concerning this matter.

## 2012-06-04 NOTE — Telephone Encounter (Signed)
Pt states she has been having episodes of ache in her chest that involves both arms, feels like an intense ache like the flu and happens at all diff times, started 2-3 weeks ago and happens daily and lasts 30 sec to 1 minute, DENIES nausea and SOB. Pt has HX of LBBB, pt has delivered her baby now and all went smooth, she has not checked bp/p but feels her heart rate is normal/ no racing or skipped beats. C/o not sleeping well and also wonders if it could be a thyroid problem? Told pt I will forward a msg to Dr Elease Hashimoto and will call her back if any further suggestions but she needs to make an app with pcp. Pt agreed to plan.

## 2012-07-15 ENCOUNTER — Encounter: Payer: Self-pay | Admitting: Obstetrics and Gynecology

## 2012-07-29 ENCOUNTER — Encounter: Payer: Self-pay | Admitting: Gynecology

## 2012-07-30 ENCOUNTER — Ambulatory Visit (INDEPENDENT_AMBULATORY_CARE_PROVIDER_SITE_OTHER): Payer: BC Managed Care – PPO | Admitting: Gynecology

## 2012-07-30 ENCOUNTER — Encounter: Payer: Self-pay | Admitting: Gynecology

## 2012-07-30 ENCOUNTER — Encounter: Payer: BC Managed Care – PPO | Admitting: Obstetrics and Gynecology

## 2012-07-30 DIAGNOSIS — Z9884 Bariatric surgery status: Secondary | ICD-10-CM

## 2012-07-30 DIAGNOSIS — N920 Excessive and frequent menstruation with regular cycle: Secondary | ICD-10-CM

## 2012-07-30 DIAGNOSIS — D649 Anemia, unspecified: Secondary | ICD-10-CM

## 2012-07-30 DIAGNOSIS — K909 Intestinal malabsorption, unspecified: Secondary | ICD-10-CM

## 2012-07-30 DIAGNOSIS — N921 Excessive and frequent menstruation with irregular cycle: Secondary | ICD-10-CM

## 2012-07-30 NOTE — Progress Notes (Signed)
37 y.o.   Married    Caucasian   female   [phone] for evaluation of menorrhagia.  Pt reports menses are irregular, can vary from 3d-10d of flow, fig size clots, severe dysmenorrhea, can miss work, reports often bleeding runs down legs, often wil change clothing and sheets multiple times during menses.  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  Pt reports recent repeat c/s 12/2011.  Pt did not breast feed either child.     Patient's last menstrual period was 07/16/2012.          Sexually active: yes  The current method of family planning is tubal ligation.    Exercising: no Last mammogram:  8 years ago Last pap smear:  4 months ago History of abnormal pap: none Smoking: no Alcohol: no Last colonoscopy: none Last Bone Density:  none Last tetanus shot: 2 years ago Last cholesterol check: 6 months ago  Hgb: PCP      Urine: PCP   No family history on file.  Patient Active Problem List   Diagnosis Date Noted  . 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 Chronic anemia 01/11/2012  . S/P cesarean section (11/12, rpt & BTL) 01/10/2012  . Postpartum care following cesarean delivery 01/10/2012  . Chronic hypertension complicating or reason for care during pregnancy 01/10/2012  . Maternal anemia complicating pregnancy, childbirth, or the puerperium 01/10/2012  . Morbidly obese 01/10/2012  . LBBB (left bundle branch block) 09/25/2011  . MVP (mitral valve prolapse) 09/25/2011  . Pregnancy 09/25/2011    Past Medical History  Diagnosis Date  . Obesity   . Anemia   . Depression     history of depression- none now  . 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 Heartburn in pregnancy   . Complication of anesthesia     pt states she was given too much anesthesia   . PONV (postoperative nausea and vomiting)   . S/P cesarean section (11/12, rpt, suspect macrosomia) 01/10/2012  . GDM, class A2 01/10/2012  .  Postpartum care following cesarean delivery 01/10/2012  . Chronic hypertension complicating or reason for care during pregnancy 01/10/2012  . Maternal anemia complicating pregnancy, childbirth, or the puerperium 01/10/2012  . Morbidly obese 01/10/2012  . Chronic anemia 01/11/2012  . Anxiety   . Dysmenorrhea   . Infertility, female     Past Surgical History  Procedure Laterality Date  . Gastric bypass      status Post  . Cardiac catheterization  11-07-2002    The left anterior descending artery is smooth and normal. There are several moderate sized diagonal branch which are normal. The left circumflex artery is normal. It is a very large vessel. The right coronary artery is large and dominant. EF 65%. Smooth and normal coronary arteries. Normal left ventricular systolic function.   . Cardiovascular stress test  2004    revealed anterior apical ischemia.  . Cesarean section    . Cesarean section with bilateral tubal ligation  01/09/2012    Procedure: CESAREAN SECTION WITH BILATERAL TUBAL LIGATION;  Surgeon: Genia Del, MD;  Location: [facility];  Service: Obstetrics;  Laterality: Bilateral;  Repeat C/S   EDD: 01/11/12    Allergies: Amoxicillin  Current Outpatient Prescriptions  Medication Sig Dispense Refill  . FLUoxetine (PROZAC) 20 MG capsule Take 20 mg by mouth daily.      . iron polysaccharides (NIFEREX)  150 MG capsule Take 1 capsule (150 mg total) by mouth 2 (two) times daily.  30 capsule  5  . Prenatal Vit-Fe Fumarate-FA (PRENATAL MULTIVITAMIN) TABS Take 1 tablet by mouth daily.      Marland Kitchen aspirin 81 MG chewable tablet Chew 81 mg by mouth daily.       No current facility-administered medications for this visit.    ROS: Pertinent items are noted in HPI.  Social Hx:    Exam:    LMP 07/16/2012   Wt Readings from Last 3 Encounters:  01/09/12 350 lb (158.759 kg)  01/09/12 350 lb (158.759 kg)  01/08/12 350 lb (158.759 kg)     Ht Readings from Last 3 Encounters:  01/09/12  5\' 4"  (1.626 m)  01/09/12 5\' 4"  (1.626 m)  01/08/12 5\' 4"  (1.626 m)    General appearance: alert, cooperative and appears stated age Head: Normocephalic, without obvious abnormality, atraumatic Neck: no adenopathy, supple, symmetrical, trachea midline and thyroid not enlarged, symmetric, no tenderness/mass/nodules Lungs: clear to auscultation bilaterally Breasts: Inspection negative, No nipple retraction or dimpling, No nipple discharge or bleeding, No axillary or supraclavicular adenopathy, Normal to palpation without dominant masses Heart: regular rate and rhythm Abdomen: obese, vertical scar from Roux en y to umbilicus, c/s scar under pannus, large mobile pannus, no organs palpable Extremities: extremities normal, atraumatic, no cyanosis or edema Skin: Skin color, texture, turgor normal. No rashes or lesions Lymph nodes: Cervical, supraclavicular, and axillary nodes normal. No abnormal inguinal nodes palpated Neurologic: Grossly normal   Pelvic: External genitalia:  no lesions              Urethra:  normal appearing urethra with no masses, tenderness or lesions              Bartholins and Skenes: normal                 Vagina: normal appearing vagina with normal color and discharge, no lesions              Cervix: normal appearance              Pap taken: no        Bimanual Exam:  Uterus:  uterus is normal size, shape, consistency and nontender                                      Adnexa: not palpable due to habitus                                       A: menorrhagia S/p gastric bypass Severe anemia   P: D/W she is poor candidate for uterine ablation due to recent surgery, 2 c/s and age. She is severely anemic due to combination of menses and bypass, last Hb 7. Check TSH, PRL, SHG and poss biopsy, CBC, B12, iron, ferritin, folate   Pt with no f/u re gastric bypass since moving from [place], taking otc vitamins, will refer to dietary and Gen surgery [facility]   An After Visit  Summary was printed and given to the patient.   Length of consult regarding menorrhagia and malabsorption 

## 2012-08-02 ENCOUNTER — Telehealth: Payer: Self-pay | Admitting: *Deleted

## 2012-08-02 ENCOUNTER — Telehealth: Payer: Self-pay | Admitting: Gynecology

## 2012-08-02 NOTE — Telephone Encounter (Signed)
Patient is new . B-12 management was discuss . But patient is unsure about where she is to receive it in the office or some where else.

## 2012-08-02 NOTE — Telephone Encounter (Signed)
PATIENT NOTIFIED THAT WILL SEND MESSAGE TO DR. LATHROP AND WILL CALL BACK WITH INFORMATION FOR HER./ SEE INCOMING CALL FROM PATIENT BELOW.

## 2012-08-02 NOTE — Telephone Encounter (Signed)
Mauri calling with abnormal CBC result.  CBC from 07-30-12 has Hgb 6.8. Will fax result as well.

## 2012-08-05 ENCOUNTER — Telehealth: Payer: Self-pay | Admitting: *Deleted

## 2012-08-05 ENCOUNTER — Other Ambulatory Visit: Payer: Self-pay | Admitting: Gynecology

## 2012-08-05 ENCOUNTER — Encounter: Payer: Self-pay | Admitting: *Deleted

## 2012-08-05 NOTE — Telephone Encounter (Signed)
Left a message for bariatric surgery coordinator Okey Regal to call us and see if we can do something  to help this patient.

## 2012-08-05 NOTE — Telephone Encounter (Signed)
[facility], we are waiting for all but the CBC, I have referred her to nutrition and general surgeons who follow gastric bypass pt's, ultimately, they will manage her labs

## 2012-08-05 NOTE — Addendum Note (Signed)
Addended by: Douglass Rivers on: 08/05/2012 09:06 AM   Modules accepted: Orders

## 2012-08-05 NOTE — Telephone Encounter (Signed)
PATIENT NOTIFIED OF DR. LATHROP RESPONSE OF LAB RESULTS. WILL NOTIFY WHEN RESULTS ARE BACK AND ALSO WILL BE NOTIFIED OF DATES AND TIME OF APPOINTMENTS FOR NUTRITIONIST AND GENERAL SURGEON TO SEE. SUE

## 2012-08-05 NOTE — Telephone Encounter (Signed)
We don't have her records, I spoke to someone there who said under the circumstances, they would take her

## 2012-08-05 NOTE — Telephone Encounter (Signed)
Patient was to be scheduled for appointment with [facility] in follow up of gastric bypass surgery she had in [place].  Left her a message to call me regarding having the records from that surgery faxed to Okey Regal at [facility] [phone] they will need to reviewed by the surgical team before they take her as a patient.

## 2012-08-06 ENCOUNTER — Other Ambulatory Visit: Payer: Self-pay

## 2012-08-06 ENCOUNTER — Other Ambulatory Visit: Payer: Self-pay | Admitting: Gynecology

## 2012-08-12 ENCOUNTER — Telehealth: Payer: Self-pay | Admitting: *Deleted

## 2012-08-12 NOTE — Telephone Encounter (Signed)
Left Message To Call Back  

## 2012-08-13 ENCOUNTER — Other Ambulatory Visit: Payer: Self-pay

## 2012-08-13 ENCOUNTER — Ambulatory Visit (INDEPENDENT_AMBULATORY_CARE_PROVIDER_SITE_OTHER): Payer: BC Managed Care – PPO | Admitting: Gynecology

## 2012-08-13 ENCOUNTER — Ambulatory Visit (INDEPENDENT_AMBULATORY_CARE_PROVIDER_SITE_OTHER): Payer: BC Managed Care – PPO

## 2012-08-13 DIAGNOSIS — K909 Intestinal malabsorption, unspecified: Secondary | ICD-10-CM

## 2012-08-13 DIAGNOSIS — N920 Excessive and frequent menstruation with regular cycle: Secondary | ICD-10-CM

## 2012-08-13 DIAGNOSIS — N852 Hypertrophy of uterus: Secondary | ICD-10-CM

## 2012-08-13 DIAGNOSIS — N838 Other noninflammatory disorders of ovary, fallopian tube and broad ligament: Secondary | ICD-10-CM

## 2012-08-13 DIAGNOSIS — N921 Excessive and frequent menstruation with irregular cycle: Secondary | ICD-10-CM

## 2012-08-13 DIAGNOSIS — D649 Anemia, unspecified: Secondary | ICD-10-CM

## 2012-08-13 DIAGNOSIS — N839 Noninflammatory disorder of ovary, fallopian tube and broad ligament, unspecified: Secondary | ICD-10-CM

## 2012-08-13 NOTE — Progress Notes (Signed)
  Pt here for u/s and emb for DUB and severe anemia, pt recently started sl B12, she is to start FeSO4 BID and MVI as her hb 6.8, Fe level 14.  Pt was referred to [facility] for f/u after bypass and nutritional support.   U/S images reviewed with Pt.  ems 6.67, left ovary with thin walled cyst-avascular. Recommend pt proceed with EMB as discussed earlier, if benign, recommend depo-luporn to turn off cycles so pt can recover from severe anemia-agreeable  endometrial biopsy: Consent obtained, questions addressed speculum placed, cervix cleansed with betadine, xylocaine jelly placed in endocervix and ant tip, pipelle advanced to 7cm.  Large amount of tissue obtained on single pass. Pt tolerated well. Tissue to path

## 2012-08-13 NOTE — Telephone Encounter (Signed)
Pt. Notified of labs and aware to take iron 3x./qd  And that she needs to come in for a CBC with retic count recheck scheduled for 08/29/12 @ 2:30.

## 2012-08-22 ENCOUNTER — Telehealth: Payer: Self-pay | Admitting: *Deleted

## 2012-08-22 NOTE — Telephone Encounter (Signed)
Patient notified and instructed on taking Micronor, Progesterone only pill daily at same time QD. May have BTB while body adjusting.  She states she didn't think she was scheduled for any Lupron yet, that Dr lathrop was waiting for next menses.  Marland Kitchen  LMP end of May so she is due to start again next week although she admits menses unpredictable.   Do you want her to wait till menses before beginning Micronor?  Should we submit for insurance authoriaztion for Lupron?

## 2012-08-22 NOTE — Telephone Encounter (Signed)
Message copied by Alisa Graff on Thu Aug 22, 2012 11:29 AM ------      Message from: Douglass Rivers      Created: Sun Aug 18, 2012 11:11 AM       Biopsy is benign, too much estrogen, recommend since she has tubal, that she use progestin until her lupron gets covered, she can use micronor ------

## 2012-08-22 NOTE — Telephone Encounter (Signed)
i think we need the lupron, her hb is 6.8, i just thought if it would take a long time, we should use the micronor in the interim, if you think we can get the lupron before midcycle, she can skip the micronor, how do I order it?

## 2012-08-23 ENCOUNTER — Telehealth (INDEPENDENT_AMBULATORY_CARE_PROVIDER_SITE_OTHER): Payer: Self-pay | Admitting: Surgery

## 2012-08-23 NOTE — Telephone Encounter (Signed)
Received records for bariatric transfer of care request. 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 OK to see per Dr Daphine Deutscher. 08/22/12 cef

## 2012-08-23 NOTE — Telephone Encounter (Signed)
Patient notified per Dr Farrel Gobble instruction, will begin auth for Lupron.  Patient with BTSP.  Instructed to call with menses and will schedule Lupron injection.  Ok to omit Micronor for now.  When Lupron given, will you want add back therapy?

## 2012-08-27 ENCOUNTER — Encounter (INDEPENDENT_AMBULATORY_CARE_PROVIDER_SITE_OTHER): Payer: Self-pay

## 2012-08-28 NOTE — Telephone Encounter (Signed)
Prior auth forms received. Will complete and send to pharmacy.

## 2012-08-28 NOTE — Telephone Encounter (Signed)
Please call regarding prior authorization.

## 2012-08-28 NOTE — Telephone Encounter (Signed)
Call back to [place] at [place] and left message.

## 2012-08-28 NOTE — Telephone Encounter (Signed)
Routed to Sally 

## 2012-08-29 ENCOUNTER — Other Ambulatory Visit (INDEPENDENT_AMBULATORY_CARE_PROVIDER_SITE_OTHER): Payer: BC Managed Care – PPO

## 2012-08-29 DIAGNOSIS — Z0189 Encounter for other specified special examinations: Secondary | ICD-10-CM

## 2012-09-02 ENCOUNTER — Telehealth: Payer: Self-pay | Admitting: Gynecology

## 2012-09-02 NOTE — Telephone Encounter (Signed)
Return call to patient, started menses yesterday 09-01-12.  She received coverage information from [employer] in the mail, advised prior auth info was received by our office and has been completed and returned today.  She will call [employer] tomorrow to see about shipment date.  Patient is aware needs to get injection this week.

## 2012-09-02 NOTE — Telephone Encounter (Signed)
Patient started cycle on the 6th. Was told to call when this happened

## 2012-09-02 NOTE — Telephone Encounter (Signed)
Ok to give 3.75 dose while waiting for 11.25mg  to arrive

## 2012-09-02 NOTE — Telephone Encounter (Signed)
Routed to Dr. Farrel Gobble

## 2012-09-03 NOTE — Telephone Encounter (Signed)
Call to [employer], Synetta Fail, advised prior auth forms had been returned, she requests we refax direct to [place] on [address] at [phone].  Forms refaxed.  (forms were previously faxed to [phone])

## 2012-09-03 NOTE — Telephone Encounter (Signed)
[facility] calling to check on prior auth for Lupron. Their number is [phone].

## 2012-09-03 NOTE — Telephone Encounter (Signed)
PRIOR AUTHORIZATION INFO

## 2012-09-05 NOTE — Telephone Encounter (Signed)
Lupron coverage was denied from [employer]. Dr Farrel Gobble notified and she will call for MD to MD appeal.

## 2012-09-05 NOTE — Telephone Encounter (Signed)
See next phone note and prior auth forms.

## 2012-09-06 ENCOUNTER — Encounter (INDEPENDENT_AMBULATORY_CARE_PROVIDER_SITE_OTHER): Payer: Self-pay | Admitting: Surgery

## 2012-09-06 ENCOUNTER — Telehealth: Payer: Self-pay | Admitting: Gynecology

## 2012-09-06 ENCOUNTER — Ambulatory Visit (INDEPENDENT_AMBULATORY_CARE_PROVIDER_SITE_OTHER): Payer: BC Managed Care – PPO | Admitting: Surgery

## 2012-09-06 ENCOUNTER — Other Ambulatory Visit (INDEPENDENT_AMBULATORY_CARE_PROVIDER_SITE_OTHER): Payer: Self-pay

## 2012-09-06 VITALS — BP 150/90 | HR 98 | Temp 98.2°F | Resp 16 | Ht 64.0 in | Wt 322.8 lb

## 2012-09-06 DIAGNOSIS — Z6841 Body Mass Index (BMI) 40.0 and over, adult: Secondary | ICD-10-CM

## 2012-09-06 DIAGNOSIS — D649 Anemia, unspecified: Secondary | ICD-10-CM

## 2012-09-06 DIAGNOSIS — Z9884 Bariatric surgery status: Secondary | ICD-10-CM

## 2012-09-06 NOTE — Telephone Encounter (Signed)
Patient called concerning her Lupron injection. Explained note in EPIC per S.Yeakley,RN, on 09/05/2012 documentation that Lupron had been denied per [employer]. Dr Farrel Gobble was routed this information to talk with MD to MD for appeal.  Patient stated in our call that she had received a letter from [facility] and [facility] that Lupron had been approved with stipulations that co-pay would be $5.00. Explained to patient I would route this information to Dr. Farrel Gobble and Kandis Cocking and would be contacted on Monday.

## 2012-09-06 NOTE — Telephone Encounter (Signed)
pt is still waiting to schedule her Lupon shot. pt states she left a message on tuesday and no one has called her yet.

## 2012-09-06 NOTE — Progress Notes (Signed)
Chief Complaint:  History of gastric bypass in [place] in 2003  History of Present Illness:  [name] is an 37 y.o. female who has lived in [place] about 10 years and his main problem is metromenorrhagia with chronic anemia. She has been followed by Dr. Uvaldo Rising.  Dr. Douglass Rivers is contemplating either a laparoscopic/robotic or open hysterectomy.  She has a long limb bypass with a 200 cm Roux limb and a 100 cm BP limb. She initially lost over 200 pounds it now sits at a weight loss of about 100 pounds. She reports that she takes iron/vit C and nasal B 12.  She takes prenatal vitamins.    Past Medical History  Diagnosis Date  . Obesity   . Anemia   . Depression     history of depression- none now  . 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 Heartburn in pregnancy   . Complication of anesthesia     pt states she was given too much anesthesia   . PONV (postoperative nausea and vomiting)   . S/P cesarean section (11/12, rpt, suspect macrosomia) 01/10/2012  . GDM, class A2 01/10/2012  . Postpartum care following cesarean delivery 01/10/2012  . Chronic hypertension complicating or reason for care during pregnancy 01/10/2012  . Maternal anemia complicating pregnancy, childbirth, or the puerperium 01/10/2012  . Morbidly obese 01/10/2012  . Chronic anemia 01/11/2012  . Anxiety   . Dysmenorrhea   . Infertility, female     Past Surgical History  Procedure Laterality Date  . Gastric bypass      status Post  . Cardiac catheterization  11-07-2002    The left anterior descending artery is smooth and normal. There are several moderate sized diagonal branch which are normal. The left circumflex artery is normal. It is a very large vessel. The right coronary artery is large and dominant. EF 65%. Smooth and normal coronary arteries. Normal left ventricular systolic function.   . Cardiovascular stress test  2004    revealed anterior apical ischemia.  . Cesarean  section    . Cesarean section with bilateral tubal ligation  01/09/2012    Procedure: CESAREAN SECTION WITH BILATERAL TUBAL LIGATION;  Surgeon: Genia Del, MD;  Location: [facility];  Service: Obstetrics;  Laterality: Bilateral;  Repeat C/S   EDD: 01/11/12    Current Outpatient Prescriptions  Medication Sig Dispense Refill  . FLUoxetine (PROZAC) 20 MG capsule Take 20 mg by mouth daily.      . iron polysaccharides (NIFEREX) 150 MG capsule Take 1 capsule (150 mg total) by mouth 2 (two) times daily.  30 capsule  5  . Prenatal Vit-Fe Fumarate-FA (PRENATAL MULTIVITAMIN) TABS Take 1 tablet by mouth daily.       No current facility-administered medications for this visit.   Amoxicillin History reviewed. No pertinent family history. Social History:   reports that she has never smoked. She does not have any smokeless tobacco history on file. She reports that she does not drink alcohol or use illicit drugs.   REVIEW OF SYSTEMS - PERTINENT POSITIVES ONLY: No history of DVT  Physical Exam:   Blood pressure 150/90, pulse 98, temperature 98.2 F (36.8 C), resp. rate 16, height 5\' 4"  (1.626 m), weight 322 lb 12.8 oz (146.421 kg). Body mass index is 55.38 kg/(m^2).  Gen:  WDWN white female NAD  Neurological: Alert and oriented to person, place, and time. Motor and sensory function is grossly intact  Head: Normocephalic and atraumatic.  Eyes: Conjunctivae are normal. Pupils are equal, round, and reactive to light. No scleral icterus.  Neck: Normal range of motion. Neck supple. No tracheal deviation or thyromegaly present.  Cardiovascular:  SR without murmurs or gallops.  No carotid bruits Respiratory: Effort normal.  No respiratory distress. No chest wall tenderness. Breath sounds normal.  No wheezes, rales or rhonchi.  Abdomen:  Prior midline incision from her open bypass and 2 incisions from her C-sections (two out of four were vaginal deliveries).  No obvious hernias  felt. GU: Musculoskeletal: Normal range of motion. Extremities are nontender. No cyanosis, edema or clubbing noted Lymphadenopathy: No cervical, preauricular, postauricular or axillary adenopathy is present Skin: Skin is warm and dry. No rash noted. No diaphoresis. No erythema. No pallor. Pscyh: Normal mood and affect. Behavior is normal. Judgment and thought content normal.   LABORATORY RESULTS: No results found for this or any previous visit (from the past 48 hour(s)).  RADIOLOGY RESULTS: No results found.  Problem List: Patient Active Problem List   Diagnosis Date Noted  . 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 Chronic anemia 01/11/2012  . S/P cesarean section (11/12, rpt & BTL) 01/10/2012  . Postpartum care following cesarean delivery 01/10/2012  . Chronic hypertension complicating or reason for care during pregnancy 01/10/2012  . Maternal anemia complicating pregnancy, childbirth, or the puerperium 01/10/2012  . Morbidly obese 01/10/2012  . LBBB (left bundle branch block) 09/25/2011  . MVP (mitral valve prolapse) 09/25/2011  . Pregnancy 09/25/2011    Assessment & Plan: Long limb open bypass with marginal weight loss. This might be enhanced by nutritional intervention and exercise. We'll get her up for an appointment to see Huntley Dec and hopefully get her involved with our support group.     Matt B. Daphine Deutscher, MD, [facility] [phone] beeper [phone]  09/06/2012 4:42 PM

## 2012-09-06 NOTE — Patient Instructions (Addendum)
Appt with Huntley Dec Himmelrich (dietician) Contact person is Skip Estimable --[facility] bariatric coordinator--I have forwarded her info on you

## 2012-09-12 ENCOUNTER — Telehealth (HOSPITAL_COMMUNITY): Payer: Self-pay

## 2012-09-12 NOTE — Telephone Encounter (Signed)
Left message for patient to return call.  Per Dr Daphine Deutscher requested I contact patient to make aware of services offered by bariatric program at [facility].

## 2012-09-13 NOTE — Telephone Encounter (Signed)
I called [employer] to check on medication shipment.  They have the case listed as denied and do not have any information on a patient assistance program. Call to patient.  She received a letter from [employer] about coverage for medication only being $5.  Explained that the letter is explaining the speciality pharmacy benefits but that during prior authorization with [employer], coverage was denied since menorrhagia is not a covered diagnosis for this medication (per [employer] guidelines).  Advised only option is to attempt appeal.  Will review with Dr Farrel Gobble for LMN.   Dr Farrel Gobble, since case in denied, we have to appeal instead of MD/MD review so I need a letter that tells them why you want her to have Lupron.

## 2012-09-15 NOTE — Telephone Encounter (Signed)
Didn't she get this already

## 2012-09-16 NOTE — Telephone Encounter (Signed)
She has not received any Lupron.  We were told of the denial and then she called back and left a message that she had received word it was approved.  Patient misunderstood this letter.  She has the pharmacy benefit but no coverage for the diagnosis of menorrhagia.  We will need to try to appeal or come up with another option.

## 2012-09-20 ENCOUNTER — Encounter: Payer: Self-pay | Admitting: Gynecology

## 2012-09-23 ENCOUNTER — Telehealth: Payer: Self-pay | Admitting: Gynecology

## 2012-09-23 NOTE — Telephone Encounter (Signed)
Patient called to talk to you about Lupron.

## 2012-09-24 ENCOUNTER — Telehealth: Payer: Self-pay | Admitting: Gynecology

## 2012-09-24 MED ORDER — NORETHINDRONE 0.35 MG PO TABS: 1.0000 | ORAL_TABLET | Freq: Every day | ORAL | Status: DC

## 2012-09-24 NOTE — Telephone Encounter (Signed)
Pt had called earlier today interested in IV iron to help bring up her counts.  I explained the risks associated with IV iron and do not recommend it.  I informed pt we are still waiting on appeal and that I spoke with [employer] this afternoon to see about a rush on it as they said that an appeal will take 30d.   Pt states she had not been on ocp for 15y, and that ocp was pre-bypass.  I suggest we try micronor while waiting for appeal, unsure how well she will absorb it, she is willing to try rx called in.  She is due to start menses and will start the first day of flow

## 2012-09-24 NOTE — Telephone Encounter (Signed)
Patient calling to check on status of Lupron appeal.  Advised letter was written by dr lathrop and faxed on 09-20-12.  Patient asking to begin other options while waiting.  Can she begin POP?  Menses is due first or second week of August and  Would like anything to help reduce this.  Also wants to know if she would be a candidate for IV iron?  She just feels  So exhausted and would like to improve this anemia so she would feel better.  [facility] [address].  Route to [place] as I am out of office this PM and patient anxious th start something to prevent cycle

## 2012-10-03 ENCOUNTER — Telehealth: Payer: Self-pay | Admitting: Orthopedic Surgery

## 2012-10-03 NOTE — Telephone Encounter (Signed)
Spoke with rep about pt's appeal claim for Lupron Depot. States a letter was mailed to Dr. Farrel Gobble on 09-25-12. Rep will fax a copy of letter to [place].  Letter states appeal was denied. Paper chart and letter on your desk.

## 2012-10-07 ENCOUNTER — Telehealth: Payer: Self-pay | Admitting: *Deleted

## 2012-10-07 NOTE — Telephone Encounter (Signed)
Spoke to Lupron rep on patient's behalf.  Asking for any assistance with appeal process.  She states she would be able to provide a sample dose for this patient.  Call to patient and LMTCB.  Will need OV with Dr Farrel Gobble.

## 2012-10-08 NOTE — Telephone Encounter (Signed)
Patient returned call and notified of need for consult to discuss/possibly begin Lupron.  Appt scheduled for 10-10-12.

## 2012-10-08 NOTE — Telephone Encounter (Signed)
Patient returning Sally's call. °

## 2012-10-08 NOTE — Telephone Encounter (Signed)
Return call to patient, LMTCB.  

## 2012-10-10 ENCOUNTER — Encounter: Payer: Self-pay | Admitting: Gynecology

## 2012-10-10 ENCOUNTER — Ambulatory Visit (INDEPENDENT_AMBULATORY_CARE_PROVIDER_SITE_OTHER): Payer: BC Managed Care – PPO | Admitting: Gynecology

## 2012-10-10 VITALS — BP 120/80 | HR 78 | Resp 12 | Ht 64.0 in | Wt 321.0 lb

## 2012-10-10 DIAGNOSIS — Z9884 Bariatric surgery status: Secondary | ICD-10-CM

## 2012-10-10 DIAGNOSIS — K909 Intestinal malabsorption, unspecified: Secondary | ICD-10-CM

## 2012-10-10 DIAGNOSIS — D649 Anemia, unspecified: Secondary | ICD-10-CM

## 2012-10-10 DIAGNOSIS — N921 Excessive and frequent menstruation with irregular cycle: Secondary | ICD-10-CM

## 2012-10-10 DIAGNOSIS — N920 Excessive and frequent menstruation with regular cycle: Secondary | ICD-10-CM

## 2012-10-10 NOTE — Patient Instructions (Addendum)
Total Laparoscopic Hysterectomy A total laparoscopic hysterectomy is a minimally invasive surgery to remove your uterus and cervix. This surgery is performed by making several small cuts (incisions) in your abdomen. It can also be done with a thin, lighted tube (laparoscope) inserted into 2 small incisions in the lower abdomen. Your fallopian tubes and ovaries can be removed (bilateral salpingo-oopherectomy) during this surgery as well.If a total laparoscopic hysterectomy is started and it is not safe to continue, the laparoscopic surgery will be converted to an open abdominal surgery. You will not have menstrual periods or be able to get pregnant after having this surgery. If a bilateral salpingo-oopherectomy was performed before menopause, you will go through a sudden (abrupt) menopause. This can be helped with hormone medicines. Benefits of minimally invasive surgery include:  Less pain.  Less risk of blood loss.  Less risk of infection.  Quicker return to normal activities.  Usually a 1 night stay in the hospital.  Overall patient satisfaction. LET YOUR CAREGIVER KNOW ABOUT:  Any history of abnormal Pap tests.  Allergies to food or medicine.  Medicines taken, including vitamins, herbs, eyedrops, over-the-counter medicines, and creams.  Use of steroids (by mouth or creams).  Previous problems with anesthetics or numbing medicines.  History of bleeding problems or blood clots.  Previous surgery.  Other health problems, including diabetes and kidney problems.  Desire for future fertility.  Any infections or colds you may have developed.  Symptoms of irregular or heavy periods, weight loss, or urinary or bowel changes. RISKS AND COMPLICATIONS   Bleeding.  Blood clots in the legs or lung.  Infection.  Injury to surrounding organs.  Problems with anesthesia.  Early menopause symptoms (hot flashes, night sweats, insomnia).  Risk of conversion to an open abdominal  incision. BEFORE THE PROCEDURE  Ask your caregiver about changing or stopping your regular medicines.  Do not take aspirin or blood thinners (anticoagulants) for 1 week before the surgery, or as told by your caregiver.  Do not eat or drink anything for 8 hours before the surgery, or as told by your caregiver.  Quit smoking if you smoke.  Arrange for a ride home after surgery and for someone to help you at home during recovery. PROCEDURE   You will be given antibiotic medicine.  An intravenous (IV) line will be placed in your arm. You will be given medicine to make you sleep (general anesthetic).  A gas (carbon dioxide) will be used to inflate your abdomen. This will allow your surgeon to look inside your abdomen, perform your surgery, and treat any other problems found if necessary.  Three or four small incisions (often less than  inch) will be made in your abdomen. One of these incisions will be made in the area of your belly button (navel). The laparoscope will be inserted into the incision. Your surgeon will look through the laparoscope while doing your procedure.  Other surgical instruments will be inserted through the other incisions.  The uterus may be removed through the vagina or cut into small pieces and removed through the small incisions.  Your incisions will be closed. AFTER THE PROCEDURE  The gas will be released from inside your abdomen.  You will be taken to the recovery area where a nurse will watch and check your progress. Once you are awake, stable, and taking fluids well, without other problems, you will return to your room or be allowed to go home.  There is usually minimal discomfort following the surgery because  the incisions are so small.  You will be given pain medicine while you are in the hospital and for when you go home.  Try to have someone with you the first 3 to 5 days after you go home.  Follow up with your surgeon in 2 to 4 weeks after surgery  to evaluate your progress. Document Released: 12/11/2006 Document Revised: 05/08/2011 Document Reviewed: 09/30/2010 [facility] Patient Information 2014 [place], [place].  Watch youtube video on robotic hysterectomy   Leuprolide depot injection or implant What is this medicine? LEUPROLIDE (loo PROE lide) is a man-made protein that acts like a natural hormone in the body. It decreases testosterone in men and decreases estrogen in women. In men, this medicine is used to treat advanced prostate cancer. In women, some forms of this medicine may be used to treat endometriosis, uterine fibroids, or other female hormone-related problems. This medicine may be used for other purposes; ask your health care provider or pharmacist if you have questions. What should I tell my health care provider before I take this medicine? They need to know if you have any of these conditions: -diabetes -heart disease or previous heart attack -high blood pressure -high cholesterol -osteoporosis -pain or difficulty passing urine -spinal cord metastasis -stroke -tobacco smoker -unusual vaginal bleeding (women) -an unusual or allergic reaction to leuprolide, benzyl alcohol, other medicines, foods, dyes, or preservatives -pregnant or trying to get pregnant -breast-feeding How should I use this medicine? This medicine is for injection into a muscle or for implant or injection under the skin. It is given by a health care professional in a hospital or clinic setting. The specific product will determine how it will be given to you. Make sure you understand which product you receive and how often you will receive it. Talk to your pediatrician regarding the use of this medicine in children. Special care may be needed. Overdosage: If you think you have taken too much of this medicine contact a poison control center or emergency room at once. NOTE: This medicine is only for you. Do not share this medicine with others. What if I  miss a dose? It is important not to miss a dose. Call your doctor or health care professional if you are unable to keep an appointment. Depot injections: Depot injections are given either once-monthly, every 12 weeks, every 16 weeks, or every 24 weeks depending on the product you are prescribed. The product you are prescribed will be based on if you are female or female, and your condition. Make sure you understand your product and dosing. Implant dosing: The implant is removed and replaced once a year. The implant is only used in males. What may interact with this medicine? Do not take this medicine with any of the following medications: -chasteberry This medicine may also interact with the following medications: -herbal or dietary supplements, like black cohosh or DHEA -female hormones, like estrogens or progestins and birth control pills, patches, rings, or injections -female hormones, like testosterone This list may not describe all possible interactions. Give your health care provider a list of all the medicines, herbs, non-prescription drugs, or dietary supplements you use. Also tell them if you smoke, drink alcohol, or use illegal drugs. Some items may interact with your medicine. What should I watch for while using this medicine? Visit your doctor or health care professional for regular checks on your progress. During the first weeks of treatment, your symptoms may get worse, but then will improve as you continue your treatment.  You may get hot flashes, increased bone pain, increased difficulty passing urine, or an aggravation of nerve symptoms. Discuss these effects with your doctor or health care professional, some of them may improve with continued use of this medicine. Female patients may experience a menstrual cycle or spotting during the first months of therapy with this medicine. If this continues, contact your doctor or health care professional. What side effects may I notice from  receiving this medicine? Side effects that you should report to your doctor or health care professional as soon as possible: -allergic reactions like skin rash, itching or hives, swelling of the face, lips, or tongue -breathing problems -chest pain -depression or memory disorders -pain in your legs or groin -pain at site where injected or implanted -severe headache -swelling of the feet and legs -visual changes -vomiting Side effects that usually do not require medical attention (report to your doctor or health care professional if they continue or are bothersome): -breast swelling or tenderness -decrease in sex drive or performance -diarrhea -hot flashes -loss of appetite -muscle, joint, or bone pains -nausea -redness or irritation at site where injected or implanted -skin problems or acne This list may not describe all possible side effects. Call your doctor for medical advice about side effects. You may report side effects to FDA at 1-800-FDA-1088. Where should I keep my medicine? This drug is given in a hospital or clinic and will not be stored at home. NOTE: This sheet is a summary. It may not cover all possible information. If you have questions about this medicine, talk to your doctor, pharmacist, or health care provider.  2013, [employer]. (08/17/2009 2:41:21 PM)

## 2012-10-10 NOTE — Progress Notes (Signed)
Subjective:     Patient ID: [name], female   DOB: 10-14-1975, 37 y.o.   MRN: [id number]  HPI Comments: Pt here to discuss lurpon therapy, her insurance did not cover but we got it donated.  Pt was started on micronor in the interrim and she is currently on 2nd week, tolerating well.  We asked pt to come in today to discuss lupron therapy and impending hysterectomy.    Review of Systems  Genitourinary: Negative for vaginal bleeding, vaginal discharge, vaginal pain and pelvic pain.       Objective:   Physical Exam  Constitutional: She is oriented to person, place, and time. She appears well-developed and well-nourished.  Neurological: She is alert and oriented to person, place, and time.       Assessment:     Anemia DUB Morbid obesity     Plan:     Pt informed regarding expected affects of lupron on hormones, we discussed hot flashes, night sweats and vaginal dryness as well as moodiness.  Pt is on prozac currently which may help some of her symptoms.  Pt reports last cycle on micronor was better, lasted only 7d and only changed pads 4x/d instead of once an hour.  Pt is aware that her weight with exogenous estrogen is her greatest risk factor uterine cancer and breast cancer and diabetes.  She is aware that we plan to remove her uterus, cervix and tubes.  We will plan to check a CBC in 4w, she will continue on po iron and vitamens as scheduled. We briefly discussed robotic hysterectomy and will recommend she watch a youtube video  Pt will return to office in 10+ for first lupron injection     Length of time 38m discussing medication management

## 2012-10-21 ENCOUNTER — Ambulatory Visit (INDEPENDENT_AMBULATORY_CARE_PROVIDER_SITE_OTHER): Payer: BC Managed Care – PPO | Admitting: Gynecology

## 2012-10-21 VITALS — BP 118/66 | HR 64 | Resp 16 | Ht 64.0 in | Wt 305.0 lb

## 2012-10-21 DIAGNOSIS — N920 Excessive and frequent menstruation with regular cycle: Secondary | ICD-10-CM

## 2012-10-21 MED ORDER — LEUPROLIDE ACETATE 3.75 MG IM KIT
3.7500 mg | PACK | Freq: Once | INTRAMUSCULAR | Status: AC
  Administered 2012-10-21: 3.75 mg via INTRAMUSCULAR

## 2012-10-21 NOTE — Progress Notes (Signed)
Pt given depo lupron injection in the office today. Per dr Farrel Gobble, she doesn't want patient to take the norethindrone acetate 5mg  & also aware that patient is just having some spotting that started yesterday.She instructed patient to take her last 4 pills of micronor. pt instructed by dr Farrel Gobble to schedule next lupron here in the office for 1 mth.  Pt tolerated injection well. pt waited for 10 mins with no reaction

## 2012-11-18 ENCOUNTER — Ambulatory Visit (INDEPENDENT_AMBULATORY_CARE_PROVIDER_SITE_OTHER): Payer: BC Managed Care – PPO | Admitting: *Deleted

## 2012-11-18 ENCOUNTER — Encounter: Payer: Self-pay | Admitting: *Deleted

## 2012-11-18 VITALS — BP 118/64 | HR 82 | Resp 18 | Ht 64.0 in | Wt 316.0 lb

## 2012-11-18 DIAGNOSIS — N920 Excessive and frequent menstruation with regular cycle: Secondary | ICD-10-CM

## 2012-11-18 MED ORDER — LEUPROLIDE ACETATE 3.75 MG IM KIT: 3.7500 mg | PACK | Freq: Once | INTRAMUSCULAR | Status: DC

## 2012-11-18 NOTE — Progress Notes (Signed)
Tolerated injection well. Given Rt. Glute IM cm

## 2012-11-26 ENCOUNTER — Telehealth: Payer: Self-pay | Admitting: Gynecology

## 2012-11-26 NOTE — Telephone Encounter (Signed)
Pt calling to speak with nurse concerning her surgery. Would like to postpone because of work if possible.

## 2012-11-26 NOTE — Telephone Encounter (Signed)
Return call to patient regarding rescheduling surgery. Patient is currently scheduled for 01-01-13 and this date was planned based on her Lupron injections (which patient is receiving as samples and can only receive 3).  Advised patient that will need to review with Dr Farrel Gobble and that if waits to long, she may begin to restart menses.  Patient states that even moving by just one week will help her.

## 2012-12-02 NOTE — Telephone Encounter (Signed)
Patient calling to check on the status of her request regarding surgery.

## 2012-12-02 NOTE — Telephone Encounter (Signed)
Spoke with Dr. Farrel Gobble. Advised that it wouldn't be until after January that surgery could be re-scheduled.  Advised patient. She is agreeable to keep 11/5 surgery date.

## 2012-12-11 ENCOUNTER — Institutional Professional Consult (permissible substitution): Payer: BC Managed Care – PPO | Admitting: Gynecology

## 2012-12-18 ENCOUNTER — Telehealth: Payer: Self-pay | Admitting: Gynecology

## 2012-12-18 ENCOUNTER — Ambulatory Visit: Payer: BC Managed Care – PPO

## 2012-12-18 ENCOUNTER — Encounter: Payer: Self-pay | Admitting: Gynecology

## 2012-12-18 ENCOUNTER — Institutional Professional Consult (permissible substitution): Payer: BC Managed Care – PPO | Admitting: Gynecology

## 2012-12-18 ENCOUNTER — Ambulatory Visit (INDEPENDENT_AMBULATORY_CARE_PROVIDER_SITE_OTHER): Payer: BC Managed Care – PPO | Admitting: Gynecology

## 2012-12-18 VITALS — BP 128/80 | HR 76 | Resp 18 | Ht 64.0 in | Wt 317.0 lb

## 2012-12-18 DIAGNOSIS — N920 Excessive and frequent menstruation with regular cycle: Secondary | ICD-10-CM

## 2012-12-18 DIAGNOSIS — D649 Anemia, unspecified: Secondary | ICD-10-CM

## 2012-12-18 LAB — CBC
HCT: 24 % — ABNORMAL LOW (ref 36.0–46.0)
Hemoglobin: 6.8 g/dL — CL (ref 12.0–15.0)
Platelets: 433 10*3/uL — ABNORMAL HIGH (ref 150–400)
RBC: 4.31 MIL/uL (ref 3.87–5.11)
WBC: 6.7 10*3/uL (ref 4.0–10.5)

## 2012-12-18 MED ORDER — HYDROMORPHONE HCL 2 MG PO TABS: 2.0000 mg | ORAL_TABLET | ORAL | Status: DC | PRN

## 2012-12-18 MED ORDER — LEUPROLIDE ACETATE 3.75 MG IM KIT
3.7500 mg | PACK | Freq: Once | INTRAMUSCULAR | Status: AC
  Administered 2012-12-18: 3.75 mg via INTRAMUSCULAR

## 2012-12-18 MED ORDER — CELECOXIB 200 MG PO CAPS: ORAL_CAPSULE | ORAL | Status: DC

## 2012-12-18 NOTE — Telephone Encounter (Signed)
Verified with Insurance company that surgery is covered at 100%. Robot is not covered.

## 2012-12-18 NOTE — Patient Instructions (Addendum)
gass-X-simethicone 180mg  take 3x/d Colace 100mg  3x/d, continue 3d after last narcotic celebrex 200mg  2tab before surgery then twice a day  dulocax suppository night before and morning of Clear liquids 48h before Stop all herbals, NSAIDS for 2w

## 2012-12-18 NOTE — Progress Notes (Signed)
37 y.o.Married Caucasian [phone] female here for consideration for robotically assisted TLH  with Bilateral salpingectomy and without removal of ovaries.    She complains of abnormal uterine bleeding and is being treated with lupron for her anemia. PUS on 08/13/2012 showed the uterus to normal with normal adnexa Endometrial biopsy same day was Normal.   Proliferative endometrium with breakdown. Menses are controlled with Lupron.but has had some hot flashes, increased appetite, not sleeping well   The current method of family planning is tubal ligation.     Hormones:lupron   reports that she has never smoked. She has never used smokeless tobacco. She reports that she does not drink alcohol or use illicit drugs.  @CHLEC1NM @  Health Maintenance  Topic Date Due  . Pap Smear  01/30/1995  . Tetanus/tdap  01/30/1996  . Influenza Vaccine  09/27/2012    No family history on file.  Patient Active Problem List   Diagnosis Date Noted  . 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 Chronic anemia 01/11/2012  . S/P cesarean section (11/12, rpt & BTL) 01/10/2012  . Postpartum care following cesarean delivery 01/10/2012  . Chronic hypertension complicating or reason for care during pregnancy 01/10/2012  . Maternal anemia complicating pregnancy, childbirth, or the puerperium 01/10/2012  . Morbidly obese 01/10/2012  . LBBB (left bundle branch block) 09/25/2011  . MVP (mitral valve prolapse) 09/25/2011  . Pregnancy 09/25/2011    Past Medical History  Diagnosis Date  . Obesity   . Anemia   . Depression     history of depression- none now  . 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 Heartburn in pregnancy   . Complication of anesthesia     pt states she was given too much anesthesia   . PONV (postoperative nausea and vomiting)   . S/P cesarean section (11/12, rpt, suspect macrosomia) 01/10/2012  .  GDM, class A2 01/10/2012  . Postpartum care following cesarean delivery 01/10/2012  . Chronic hypertension complicating or reason for care during pregnancy 01/10/2012  . Maternal anemia complicating pregnancy, childbirth, or the puerperium 01/10/2012  . Morbidly obese 01/10/2012  . Chronic anemia 01/11/2012  . Anxiety   . Dysmenorrhea   . Infertility, female     Past Surgical History  Procedure Laterality Date  . Gastric bypass      status Post  . Cardiac catheterization  11-07-2002    The left anterior descending artery is smooth and normal. There are several moderate sized diagonal branch which are normal. The left circumflex artery is normal. It is a very large vessel. The right coronary artery is large and dominant. EF 65%. Smooth and normal coronary arteries. Normal left ventricular systolic function.   . Cardiovascular stress test  2004    revealed anterior apical ischemia.  . Cesarean section    . Cesarean section with bilateral tubal ligation  01/09/2012    Procedure: CESAREAN SECTION WITH BILATERAL TUBAL LIGATION;  Surgeon: Genia Del, MD;  Location: [facility];  Service: Obstetrics;  Laterality: Bilateral;  Repeat C/S   EDD: 01/11/12    Allergies: amoxicillin-hives  Current Outpatient Prescriptions  Medication Sig Dispense Refill  . FLUoxetine (PROZAC) 20 MG capsule Take 20 mg by mouth daily.      . iron polysaccharides (NIFEREX) 150 MG capsule Take 1 capsule (150 mg total) by mouth 2 (two) times  daily.  30 capsule  5  . Prenatal Vit-Fe Fumarate-FA (PRENATAL MULTIVITAMIN) TABS Take 1 tablet by mouth daily.       Current Facility-Administered Medications  Medication Dose Route Frequency Provider Last Rate Last Dose  . leuprolide (LUPRON) injection 3.75 mg  3.75 mg Intramuscular Once Bennye Alm, MD          Exam:    There were no vitals taken for this visit.  General appearance: alert, cooperative and appears stated age Head: Normocephalic, without obvious  abnormality, atraumatic Lungs: clear to auscultation bilaterally Heart: regular rate and rhythm and systolic murmur: holosystolic 2/6, blowing at 2nd left intercostal space Abdomen: soft, non-tender; bowel sounds normal; no masses,  no organomegaly and scar from Roux en Y to umbilicus, around to right Extremities: extremities normal, atraumatic, no cyanosis or edema Lymph nodes: Cervical, supraclavicular, and axillary nodes normal. no inguinal nodes palpated Neurologic: Grossly normal   Pelvic: External genitalia:  no lesions              Bartholins and Skenes: normal                 Vagina: normal appearing vagina with normal color and discharge, no lesions              Cervix: normal appearance                     Bimanual Exam:  Uterus:  uterus is normal size, shape, consistency and nontender, retroverted                                      Adnexa:    no masses                                      Rectovaginal: Confirms                                      Anus:  normal sphincter tone, no lesions   A/P:  The planned procedure was discussed with the patient.  Pre-op instructions, hospital course, and post-op instructions reviewed.  Post op instruction booklet given and reviewed.  Risks and possible complications discussed, including but not limited to, bleeding and possible transfusion; infection; anesthesia complications; injury to visceral organ requiring further surgery, either immediately or delayed; wound complications including infection, blood collections, and bowel herniation; allergic reactions; swelling of the face due to prolonged positioning;  VTE or air emboli; nerve injury related to prolonged positioning; even death.    The patient was given the opportunity to watch the informed consent video on hysterectomy.  Her questions were invited and answered.  The patient states she understands the risks and possible complications, and wishes to proceed as planned.  Consent form  signed.  She was instructed to take two 200 mg celebrex capsules with a small sip of water the morning of the procedure, and then to take one bid on POD 1 and 2.  Rx given for dilaudid 2 mg #20 to take one or two po q4-6 hours prn post op pain.    Ready for surgery.    D/w Wenda Low, MD ok to use right upper quadrant despite open incisoin

## 2012-12-18 NOTE — Telephone Encounter (Signed)
Chief Complaint  Patient presents with   admitting at [facility] from admitting calling regarding pt's surgery. Her insurance is saying the robotic hysterectomy is not covered.   Please call her at [phone].

## 2012-12-19 ENCOUNTER — Other Ambulatory Visit: Payer: Self-pay | Admitting: Gynecology

## 2012-12-19 ENCOUNTER — Encounter (HOSPITAL_COMMUNITY): Payer: Self-pay

## 2012-12-19 DIAGNOSIS — D649 Anemia, unspecified: Secondary | ICD-10-CM

## 2012-12-20 ENCOUNTER — Telehealth: Payer: Self-pay | Admitting: Oncology

## 2012-12-20 ENCOUNTER — Telehealth: Payer: Self-pay | Admitting: Gynecology

## 2012-12-20 DIAGNOSIS — D638 Anemia in other chronic diseases classified elsewhere: Secondary | ICD-10-CM

## 2012-12-20 MED ORDER — NORETHINDRONE 0.35 MG PO TABS: 1.0000 | ORAL_TABLET | Freq: Every day | ORAL | Status: DC

## 2012-12-20 NOTE — Telephone Encounter (Signed)
C/D 12/20/12 for appt. 01/03/13

## 2012-12-20 NOTE — Telephone Encounter (Signed)
Spoke with pt this morning regarding her recent pre-op labs.  Despite stopping her menses successfully with lupron and her taking both iron and multivitamins her hb is slightly less at 6.8.  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  We had previously sent her to nutrition but did not have insurance coverage, so she tried to do it on her own, we are now referring her to hematology-she understands.  In the meantime, we will try to appeal the IUD denial, will treat with oral progestins until that is resulted. Questions addressed

## 2012-12-20 NOTE — Telephone Encounter (Signed)
S/w  Eber Jones at referring office and gve np appt 11/07 @ 10:30 w/Dr. Clelia Croft.  Dx- Anemia Welcome Packet mailed.

## 2012-12-26 ENCOUNTER — Other Ambulatory Visit (HOSPITAL_COMMUNITY): Payer: BC Managed Care – PPO

## 2012-12-26 ENCOUNTER — Other Ambulatory Visit: Payer: Self-pay | Admitting: Oncology

## 2012-12-26 DIAGNOSIS — D539 Nutritional anemia, unspecified: Secondary | ICD-10-CM

## 2012-12-30 ENCOUNTER — Telehealth: Payer: Self-pay | Admitting: Gynecology

## 2012-12-30 NOTE — Telephone Encounter (Signed)
Patient has a few questions about her IUD insertion. Please call.

## 2012-12-31 NOTE — Telephone Encounter (Signed)
Spoke with pt about scheduling consult visit for [facility] options. Offered pt appt 01-06-13 in the evening. 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 Sched appt with TL 01-10-13 at 2 pm.

## 2012-12-31 NOTE — Telephone Encounter (Signed)
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 Farrel Gobble has a Plan B to help stop the bleeding. Tried to reassure pt about IUD's that they can be great at stopping bleeding. Offered pt a consult visit to discuss options further, but she is very busy with putting her father in a nursing home for [place] and her workplace office is moving locations currently. Advised I would forward to TL for any advice, and that we could then schedule an evening appt if need be, as risks and benefits will have to be discussed for any option. Pt agreeable.  Carolynn, do you know if her insurance approved her appeal for an IUD? I could not tell from prior phone notes, but it was the plan.  Dr. Farrel Gobble, do you want me to bring her in for a consult?

## 2012-12-31 NOTE — Telephone Encounter (Signed)
Consult would be great, we can do a Monday evening if that helps her

## 2013-01-01 ENCOUNTER — Ambulatory Visit (HOSPITAL_COMMUNITY): Admission: RE | Admit: 2013-01-01 | Payer: BC Managed Care – PPO | Source: Ambulatory Visit | Admitting: Gynecology

## 2013-01-01 ENCOUNTER — Encounter (HOSPITAL_COMMUNITY): Admission: RE | Payer: Self-pay | Source: Ambulatory Visit

## 2013-01-01 SURGERY — ROBOTIC ASSISTED TOTAL HYSTERECTOMY
Anesthesia: General

## 2013-01-03 ENCOUNTER — Ambulatory Visit: Payer: BC Managed Care – PPO | Admitting: Oncology

## 2013-01-03 ENCOUNTER — Other Ambulatory Visit: Payer: Self-pay | Admitting: Oncology

## 2013-01-03 ENCOUNTER — Other Ambulatory Visit: Payer: BC Managed Care – PPO | Admitting: Lab

## 2013-01-03 ENCOUNTER — Ambulatory Visit: Payer: BC Managed Care – PPO

## 2013-01-03 DIAGNOSIS — D649 Anemia, unspecified: Secondary | ICD-10-CM

## 2013-01-06 ENCOUNTER — Ambulatory Visit: Payer: BC Managed Care – PPO | Admitting: Gynecology

## 2013-01-08 ENCOUNTER — Ambulatory Visit: Payer: BC Managed Care – PPO | Admitting: Gynecology

## 2013-01-08 NOTE — Telephone Encounter (Signed)
Patient wants to cancel her appt for Friday be cause she does not want the IUD. And she said she doesn't feel like she should have to come in and pay another copay just to tell lathrop that. She wants [place] to just prescribe her something over the phone. I advised her we dont do that but she wanted to talk with the nurse. (i did not cancel the appt for Friday yet. Since we are slim on appts) (NO CHART)

## 2013-01-08 NOTE — Telephone Encounter (Signed)
Dr. Farrel Gobble, can you review and advise? Patient requesting other options for stopping bleeding.

## 2013-01-09 ENCOUNTER — Other Ambulatory Visit (HOSPITAL_BASED_OUTPATIENT_CLINIC_OR_DEPARTMENT_OTHER): Payer: BC Managed Care – PPO | Admitting: Lab

## 2013-01-09 ENCOUNTER — Telehealth: Payer: Self-pay | Admitting: Oncology

## 2013-01-09 ENCOUNTER — Ambulatory Visit: Payer: BC Managed Care – PPO

## 2013-01-09 ENCOUNTER — Ambulatory Visit (HOSPITAL_BASED_OUTPATIENT_CLINIC_OR_DEPARTMENT_OTHER): Payer: BC Managed Care – PPO | Admitting: Oncology

## 2013-01-09 ENCOUNTER — Encounter: Payer: Self-pay | Admitting: Oncology

## 2013-01-09 VITALS — BP 146/84 | HR 79 | Temp 97.2°F | Resp 18 | Ht 64.0 in | Wt 314.4 lb

## 2013-01-09 DIAGNOSIS — N920 Excessive and frequent menstruation with regular cycle: Secondary | ICD-10-CM

## 2013-01-09 DIAGNOSIS — D509 Iron deficiency anemia, unspecified: Secondary | ICD-10-CM

## 2013-01-09 DIAGNOSIS — D473 Essential (hemorrhagic) thrombocythemia: Secondary | ICD-10-CM

## 2013-01-09 DIAGNOSIS — D649 Anemia, unspecified: Secondary | ICD-10-CM

## 2013-01-09 LAB — COMPREHENSIVE METABOLIC PANEL (CC13)
Albumin: 3.6 g/dL (ref 3.5–5.0)
Alkaline Phosphatase: 67 U/L (ref 40–150)
BUN: 19.7 mg/dL (ref 7.0–26.0)
Calcium: 9.3 mg/dL (ref 8.4–10.4)
Chloride: 110 mEq/L — ABNORMAL HIGH (ref 98–109)
Creatinine: 0.8 mg/dL (ref 0.6–1.1)
Glucose: 49 mg/dl — ABNORMAL LOW (ref 70–140)
Potassium: 3.9 mEq/L (ref 3.5–5.1)

## 2013-01-09 LAB — TECHNOLOGIST REVIEW

## 2013-01-09 LAB — CBC WITH DIFFERENTIAL/PLATELET
Basophils Absolute: 0.1 10*3/uL (ref 0.0–0.1)
EOS%: 4.8 % (ref 0.0–7.0)
Eosinophils Absolute: 0.2 10*3/uL (ref 0.0–0.5)
HCT: 23 % — ABNORMAL LOW (ref 34.8–46.6)
HGB: 6.6 g/dL — CL (ref 11.6–15.9)
MCH: 15.3 pg — ABNORMAL LOW (ref 25.1–34.0)
MCHC: 28.6 g/dL — ABNORMAL LOW (ref 31.5–36.0)
MCV: 53.7 fL — ABNORMAL LOW (ref 79.5–101.0)
MONO%: 9.8 % (ref 0.0–14.0)
NEUT#: 2.6 10*3/uL (ref 1.5–6.5)
NEUT%: 51.7 % (ref 38.4–76.8)
Platelets: 337 10*3/uL (ref 145–400)
RDW: 19.7 % — ABNORMAL HIGH (ref 11.2–14.5)
lymph#: 1.6 10*3/uL (ref 0.9–3.3)

## 2013-01-09 LAB — IRON AND TIBC CHCC
Iron: 20 ug/dL — ABNORMAL LOW (ref 41–142)
TIBC: 543 ug/dL — ABNORMAL HIGH (ref 236–444)
UIBC: 523 ug/dL — ABNORMAL HIGH (ref 120–384)

## 2013-01-09 LAB — FERRITIN CHCC: Ferritin: <4 ng/ml (ref 9–269)

## 2013-01-09 MED ORDER — NORETHINDRONE 0.35 MG PO TABS: 1.0000 | ORAL_TABLET | Freq: Every day | ORAL | Status: DC

## 2013-01-09 NOTE — Progress Notes (Signed)
Please see consult note.  

## 2013-01-09 NOTE — Consult Note (Signed)
Reason for Referral: Anemia.   HPI: This is a 37 year old woman currently of [place] nephrectomy for the evaluation of chronic anemia. She is a pleasant woman with a history of obesity and menorrhagia. She is status post gastric bypass operation dating back to 2003 she also have had heavy menstrual bleeding for the majority of her adult life even prior to her given birth to her 2 children. She did require 2 C-sections without any complications that she can recall. She had been worked up for her menorrhagia extensively and never really had any anatomical problems such as fibroids or tumors. She had been prescribed Lupron in an attempt to slow her menstrual bleeding which has not significantly helped at this time and she is contemplating a hysterectomy. She had been on iron replacement continuously without really any significant improvement. Her hemoglobin have been drifting back for the last 6 years from as high as 10.8 down to now 6.8 her MCV is as low as 55 with elevated platelets of 433 the most recent iron studies showed iron level 14 and a ferritin of 3.  Clinically, she is very symptomatic from her anemia including symptoms of fatigue, tiredness and occasional shortness of breath. She does report some occasional dizziness but no syncope or dyspnea. She does not report any constitutional symptoms of fevers or chills or sweats. She does not report any seizure activity or any other neurological deficits. She does not report any chest pain or cough or hemoptysis. Does not report any lower extremity edema. Is that report any abdominal pain, nausea, vomiting or hematochezia or melena. She does not report any genitourinary bleeding is that report any frequency urgency or hesitancy. She does not report any thrombosis or any bleeding symptoms.   Past Medical History  Diagnosis Date  . Obesity   . Anemia   . Depression     history of depression- none now  . 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 Heartburn in pregnancy   . Complication of anesthesia     pt states she was given too much anesthesia   . PONV (postoperative nausea and vomiting)   . S/P cesarean section (11/12, rpt, suspect macrosomia) 01/10/2012  . GDM, class A2 01/10/2012  . Postpartum care following cesarean delivery 01/10/2012  . Chronic hypertension complicating or reason for care during pregnancy 01/10/2012  . Maternal anemia complicating pregnancy, childbirth, or the puerperium 01/10/2012  . Morbidly obese 01/10/2012  . Chronic anemia 01/11/2012  . Anxiety   . Dysmenorrhea   . Infertility, female   :  Past Surgical History  Procedure Laterality Date  . Gastric bypass      status Post  . Cardiac catheterization  11-07-2002    The left anterior descending artery is smooth and normal. There are several moderate sized diagonal branch which are normal. The left circumflex artery is normal. It is a very large vessel. The right coronary artery is large and dominant. EF 65%. Smooth and normal coronary arteries. Normal left ventricular systolic function.   . Cardiovascular stress test  2004    revealed anterior apical ischemia.  . Cesarean section    . Cesarean section with bilateral tubal ligation  01/09/2012    Procedure: CESAREAN SECTION WITH BILATERAL TUBAL LIGATION;  Surgeon: Genia Del, MD;  Location: [facility];  Service: Obstetrics;  Laterality: Bilateral;  Repeat C/S   EDD: 01/11/12  :  Current Outpatient Prescriptions  Medication Sig Dispense  Refill  . FLUoxetine (PROZAC) 20 MG capsule Take 20 mg by mouth daily.      . norethindrone (ORTHO MICRONOR) 0.35 MG tablet Take 1 tablet (0.35 mg total) by mouth daily.  1 Package  11   Current Facility-Administered Medications  Medication Dose Route Frequency Provider Last Rate Last Dose  . leuprolide (LUPRON) injection 3.75 mg  3.75 mg Intramuscular Once Bennye Alm, MD          Allergies  Allergen Reactions  . Amoxicillin  Hives  :  No family history on file.:  History   Social History  . Marital Status: Married    Spouse Name: N/A    Number of Children: N/A  . Years of Education: N/A   Occupational History  . Not on file.   Social History Main Topics  . Smoking status: Never Smoker   . Smokeless tobacco: Never Used  . Alcohol Use: No  . Drug Use: No  . Sexual Activity: Yes    Partners: Male    Birth Control/ Protection: Surgical     Comment: Tubal Ligation   Other Topics Concern  . Not on file   Social History Narrative  . No narrative on file  :  Pertinent items are noted in HPI.  Exam: ECOG 1 Blood pressure 146/84, pulse 79, temperature 97.2 F (36.2 C), temperature source Oral, resp. rate 18, height 5\' 4"  (1.626 m), weight 314 lb 6.4 oz (142.611 kg), last menstrual period 10/20/2012. General appearance: alert, cooperative and appears stated age Head: Normocephalic, without obvious abnormality, atraumatic Eyes: conjunctivae/corneas clear. PERRL, EOM's intact. Fundi benign. Throat: lips, mucosa, and tongue normal; teeth and gums normal Neck: no adenopathy, no carotid bruit, no JVD, supple, symmetrical, trachea midline and thyroid not enlarged, symmetric, no tenderness/mass/nodules Back: symmetric, no curvature. ROM normal. No CVA tenderness. Resp: clear to auscultation bilaterally Chest wall: no tenderness Cardio: regular rate and rhythm, S1, S2 normal, no murmur, click, rub or gallop GI: soft, non-tender; bowel sounds normal; no masses,  no organomegaly Extremities: extremities normal, atraumatic, no cyanosis or edema Pulses: 2+ and symmetric Skin: Skin color, texture, turgor normal. No rashes or lesions Lymph nodes: Cervical, supraclavicular, and axillary nodes normal.   Recent Labs  01/09/13 1024  WBC 5.0  HGB 6.6*  HCT 23.0*  PLT 337    Assessment and Plan:   37 year old woman with the following issues:  1. Microcytic hypochromic anemia due to iron deficiency.  Her iron deficiency is most likely related to menstrual bleeding losses as well as lack of absorption due to her gastric bypass. She has been on oral supplements in the past without really any major success. I discussed that treatment options at this time including packed red cell transfusion, IV iron infusion versus continuing more intense PO iron regimen. I feel the best approach for her would be IV iron given the fact that the ability to turn iron into red cells is not compromised and will give her a long lasting benefit. Different formulation of IV iron as were discussed today in detail and I feel Duncan Dull is a reasonable option for her. Complications from this medication were discussed today in detail including infusion-related toxicity, arthralgias and myalgias and other infusion-related complications. She is agreeable and will to proceed and we'll set that up for her as soon as possible. I will recheck her blood counts and 68 weeks to ensure adequacy of her iron stores.  2. Menorrhagia: She is planning to have a hysterectomy was  her iron stores have been repleted and I see really no contraindication to do so in the near future.  3. Thrombocytosis: Likely related to her iron deficiency in her blood counts are actually normal today and I see no evidence to suggest a blood disorder.

## 2013-01-09 NOTE — Progress Notes (Signed)
Checked in new patient with no financial issues. She has not been to [place] and she has her appt card. °

## 2013-01-09 NOTE — Telephone Encounter (Signed)
She can continue with the micronor if that worked, or nexplanon, but i do think the iud would be the best and safest option for her.  The nexplanon and iud do not require her to absorb the medication, pls ask if she's been seen by heme yet

## 2013-01-09 NOTE — Telephone Encounter (Signed)
gv pt appt schedule for November 2014 and January 2015.

## 2013-01-09 NOTE — Telephone Encounter (Signed)
Spoke with patient. She has appointment with heme this morning at 1030. She is agreeable to continue with Micronor. Order placed. Patient will call back prn.

## 2013-01-10 ENCOUNTER — Institutional Professional Consult (permissible substitution): Payer: BC Managed Care – PPO | Admitting: Gynecology

## 2013-01-10 ENCOUNTER — Ambulatory Visit (HOSPITAL_BASED_OUTPATIENT_CLINIC_OR_DEPARTMENT_OTHER): Payer: BC Managed Care – PPO

## 2013-01-10 VITALS — BP 141/82 | HR 80 | Temp 98.4°F | Resp 17

## 2013-01-10 DIAGNOSIS — D509 Iron deficiency anemia, unspecified: Secondary | ICD-10-CM

## 2013-01-10 DIAGNOSIS — D649 Anemia, unspecified: Secondary | ICD-10-CM

## 2013-01-10 MED ORDER — SODIUM CHLORIDE 0.9 % IV SOLN
1020.0000 mg | Freq: Once | INTRAVENOUS | Status: AC
  Administered 2013-01-10: 1020 mg via INTRAVENOUS
  Filled 2013-01-10: qty 34

## 2013-01-10 MED ORDER — SODIUM CHLORIDE 0.9 % IV SOLN
Freq: Once | INTRAVENOUS | Status: AC
  Administered 2013-01-10: 15:00:00 via INTRAVENOUS

## 2013-01-10 NOTE — Patient Instructions (Addendum)

## 2013-01-10 NOTE — Progress Notes (Signed)
1600--Patient monitored post infusion. No c/o discomfort or changes, no s/sx of reaction. VS WNL. Discharged home, ambulatory

## 2013-01-10 NOTE — Telephone Encounter (Signed)
great

## 2013-01-28 ENCOUNTER — Other Ambulatory Visit: Payer: Self-pay | Admitting: *Deleted

## 2013-01-28 MED ORDER — NORETHINDRONE 0.35 MG PO TABS: 1.0000 | ORAL_TABLET | Freq: Every day | ORAL | Status: DC

## 2013-01-28 NOTE — Telephone Encounter (Signed)
Rx was sent 01/09/13 (Norethindrone 0.35mg )  1 pack x 11 refills, Rx request asking for 90 day supply. Rx sent for 90 days a x refills.

## 2013-02-05 ENCOUNTER — Ambulatory Visit: Payer: BC Managed Care – PPO | Admitting: Gynecology

## 2013-03-05 ENCOUNTER — Telehealth: Payer: Self-pay | Admitting: Oncology

## 2013-03-05 ENCOUNTER — Ambulatory Visit (HOSPITAL_BASED_OUTPATIENT_CLINIC_OR_DEPARTMENT_OTHER): Payer: BC Managed Care – PPO | Admitting: Oncology

## 2013-03-05 ENCOUNTER — Telehealth: Payer: Self-pay | Admitting: Gynecology

## 2013-03-05 ENCOUNTER — Other Ambulatory Visit (HOSPITAL_BASED_OUTPATIENT_CLINIC_OR_DEPARTMENT_OTHER): Payer: BC Managed Care – PPO

## 2013-03-05 ENCOUNTER — Telehealth: Payer: Self-pay | Admitting: *Deleted

## 2013-03-05 VITALS — BP 131/58 | HR 71 | Temp 97.7°F | Resp 18 | Ht 64.0 in | Wt 318.3 lb

## 2013-03-05 DIAGNOSIS — D509 Iron deficiency anemia, unspecified: Secondary | ICD-10-CM

## 2013-03-05 DIAGNOSIS — D649 Anemia, unspecified: Secondary | ICD-10-CM

## 2013-03-05 DIAGNOSIS — Z9884 Bariatric surgery status: Secondary | ICD-10-CM

## 2013-03-05 DIAGNOSIS — R5383 Other fatigue: Secondary | ICD-10-CM

## 2013-03-05 DIAGNOSIS — R5381 Other malaise: Secondary | ICD-10-CM

## 2013-03-05 DIAGNOSIS — N920 Excessive and frequent menstruation with regular cycle: Secondary | ICD-10-CM

## 2013-03-05 LAB — CBC WITH DIFFERENTIAL/PLATELET
BASO%: 1.3 % (ref 0.0–2.0)
Basophils Absolute: 0.1 10*3/uL (ref 0.0–0.1)
EOS%: 3.8 % (ref 0.0–7.0)
Eosinophils Absolute: 0.2 10*3/uL (ref 0.0–0.5)
HCT: 33.8 % — ABNORMAL LOW (ref 34.8–46.6)
HGB: 11 g/dL — ABNORMAL LOW (ref 11.6–15.9)
LYMPH%: 27.6 % (ref 14.0–49.7)
MCH: 23.4 pg — AB (ref 25.1–34.0)
MCHC: 32.7 g/dL (ref 31.5–36.0)
MCV: 71.6 fL — AB (ref 79.5–101.0)
MONO#: 0.5 10*3/uL (ref 0.1–0.9)
MONO%: 8.5 % (ref 0.0–14.0)
NEUT#: 3.3 10*3/uL (ref 1.5–6.5)
NEUT%: 58.8 % (ref 38.4–76.8)
Platelets: 325 10*3/uL (ref 145–400)
RBC: 4.71 10*6/uL (ref 3.70–5.45)
RDW: 32.1 % — AB (ref 11.2–14.5)
WBC: 5.6 10*3/uL (ref 3.9–10.3)
lymph#: 1.5 10*3/uL (ref 0.9–3.3)

## 2013-03-05 LAB — IRON AND TIBC CHCC
%SAT: 8 % — ABNORMAL LOW (ref 21–57)
Iron: 37 ug/dL — ABNORMAL LOW (ref 41–142)
TIBC: 470 ug/dL — ABNORMAL HIGH (ref 236–444)
UIBC: 433 ug/dL — AB (ref 120–384)

## 2013-03-05 LAB — FERRITIN CHCC: FERRITIN: 8 ng/mL — AB (ref 9–269)

## 2013-03-05 NOTE — Telephone Encounter (Signed)
Ov to assess overall health since she had gastric bypass and malnutrition

## 2013-03-05 NOTE — Telephone Encounter (Signed)
Per staff message and POF I have scheduled appts.  JMW  

## 2013-03-05 NOTE — Telephone Encounter (Signed)
Return call to patient.  She reports that she has been seen by hematology and HGB has doubled.  They still want to give one more iron infusion, scheduled for tomorrow and she is released to have surgery.  Just had menses 02-28-13 to 03-04-13 and is anxious to schedule before next menses.    Ok to schedule? Robotic TLH?  Gabriel Cirri, we will need to update her precert and check her OOP cost.

## 2013-03-05 NOTE — Telephone Encounter (Signed)
Patient wanted to let dr lathrop know that she had been to the hematologist and he has cleared her for the hysterectomy. Wants to get it scheduled.

## 2013-03-05 NOTE — Progress Notes (Signed)
Hematology and Oncology Follow Up Visit  [name] [id number] 23-Sep-1975 38 y.o. 03/05/2013 8:26 AM  Principle Diagnosis: 38 year old woman with iron deficiency anemia likely due to menorrhagia and gastric bypass surgery in the past first evaluated in November of 2014.   Prior Therapy: She is status post Feraheme IV infusion on 01/10/2013.  Current therapy: Under evaluation for future therapy and possible hysterectomy.  Interim History: This is a pleasant 38 year old woman seen in followup after initial consultation in November of 2014. She presented with profound anemia with hemoglobin of 6.6 and MCV 53 and low ferritin of less than 4. She received IV iron infusion without any complications and have tolerated it well. She also noticed some improvement in her overall energy. She has not reported any chest pain shortness of breath or difficulty breathing. She still is fatigued but not as bad as previously. She is not reporting any GI bleeding or GU bleeding at this time. She continued to have heavy menstrual cycles and follow up with gynecology.  Medications: I have reviewed the patient's current medications.  Current Outpatient Prescriptions  Medication Sig Dispense Refill  . FLUoxetine (PROZAC) 20 MG capsule Take 20 mg by mouth daily.      . norethindrone (ORTHO MICRONOR) 0.35 MG tablet Take 1 tablet (0.35 mg total) by mouth daily.  3 Package  3   Current Facility-Administered Medications  Medication Dose Route Frequency Provider Last Rate Last Dose  . leuprolide (LUPRON) injection 3.75 mg  3.75 mg Intramuscular Once Azalia Bilis, MD         Allergies:  Allergies  Allergen Reactions  . Amoxicillin Hives    Past Medical History, Surgical history, Social history, and Family History were reviewed and updated.  Review of Systems: Constitutional:  Negative for fever, chills, night sweats, anorexia, weight loss, pain.  Remaining ROS negative. Physical Exam: Blood pressure  131/58, pulse 71, temperature 97.7 F (36.5 C), temperature source Oral, resp. rate 18, height 5\' 4"  (1.626 m), weight 318 lb 4.8 oz (144.38 kg). ECOG: 0 General appearance: alert, cooperative and appears stated age Head: Normocephalic, without obvious abnormality, atraumatic Neck: no adenopathy, no carotid bruit, no JVD, supple, symmetrical, trachea midline and thyroid not enlarged, symmetric, no tenderness/mass/nodules Lymph nodes: Cervical, supraclavicular, and axillary nodes normal. Heart:regular rate and rhythm, S1, S2 normal, no murmur, click, rub or gallop Lung:chest clear, no wheezing, rales, normal symmetric air entry Abdomin: soft, non-tender, without masses or organomegaly EXT:no erythema, induration, or nodules   Lab Results: Lab Results  Component Value Date   WBC 5.6 03/05/2013   HGB 11.0* 03/05/2013   HCT 33.8* 03/05/2013   MCV 71.6* 03/05/2013   PLT 325 03/05/2013     Chemistry      Component Value Date/Time   NA 142 01/09/2013 1024   NA 136 01/08/2012 1230   K 3.9 01/09/2013 1024   K 4.9 01/08/2012 1230   CL 103 01/08/2012 1230   CO2 23 01/09/2013 1024   CO2 21 01/08/2012 1230   BUN 19.7 01/09/2013 1024   BUN 11 01/08/2012 1230   CREATININE 0.8 01/09/2013 1024   CREATININE 0.57 01/08/2012 1230      Component Value Date/Time   CALCIUM 9.3 01/09/2013 1024   CALCIUM 9.2 01/08/2012 1230   ALKPHOS 67 01/09/2013 1024   ALKPHOS 108 01/08/2012 1230   AST 22 01/09/2013 1024   AST 11 01/08/2012 1230   ALT 18 01/09/2013 1024   ALT 8 01/08/2012 1230   BILITOT 0.28 01/09/2013  1024   BILITOT 0.3 01/08/2012 1230       Impression and Plan:  38 year old woman with the following issues:  1. Iron deficiency anemia: This is related to menorrhagia and her inability to absorb oral iron compounded by possibly gastric bypass surgery. She received IV iron in November of 2014 and her hemoglobin corrected up to 11 today. She is feeling a lot better but I feel that she still  slightly deficient in her iron and her hemoglobin have not completely corrected. Her that reason I have recommended another IV iron infusion for a total of 1 g which will probably maximize her iron stores. I think this will correct your hemoglobin definitely and she will be able to undergo any surgery at this time.  2. Menorrhagia: She is following up with her gynecologist and is considering a hysterectomy. From a hematology standpoint, I see no contraindication at this time. Her hemoglobin is more than adequate to withstand any operation at any point at which this will be scheduled.  [facility], MD 1/7/20158:26 AM

## 2013-03-05 NOTE — Telephone Encounter (Signed)
appts made per 1/7 POF Email to MW to add tx Call pt when tx added AVS and CAL given shh

## 2013-03-05 NOTE — Telephone Encounter (Signed)
sw pt adv iron infusion scheduled for 01/09 Pt voiced understanding shh

## 2013-03-07 ENCOUNTER — Ambulatory Visit (HOSPITAL_BASED_OUTPATIENT_CLINIC_OR_DEPARTMENT_OTHER): Payer: BC Managed Care – PPO

## 2013-03-07 ENCOUNTER — Encounter: Payer: Self-pay | Admitting: Oncology

## 2013-03-07 VITALS — BP 141/90 | HR 78 | Temp 98.4°F

## 2013-03-07 DIAGNOSIS — D509 Iron deficiency anemia, unspecified: Secondary | ICD-10-CM

## 2013-03-07 DIAGNOSIS — D649 Anemia, unspecified: Secondary | ICD-10-CM

## 2013-03-07 MED ORDER — SODIUM CHLORIDE 0.9 % IV SOLN
Freq: Once | INTRAVENOUS | Status: AC
  Administered 2013-03-07: 09:00:00 via INTRAVENOUS

## 2013-03-07 MED ORDER — SODIUM CHLORIDE 0.9 % IV SOLN
1020.0000 mg | Freq: Once | INTRAVENOUS | Status: AC
  Administered 2013-03-07: 1020 mg via INTRAVENOUS
  Filled 2013-03-07: qty 34

## 2013-03-07 NOTE — Patient Instructions (Signed)

## 2013-03-07 NOTE — Telephone Encounter (Signed)
Spoke with pt to schedule OV for pre op assessment since pt not seen since October 2014. Pt reluctantly agreed as she wants surgery as soon as possible. Sched appt 03-14-13 at 7:30 am with TL.

## 2013-03-12 NOTE — Telephone Encounter (Signed)
Routing to provider for final review. Patient agreeable to disposition. Will close encounter.     

## 2013-03-13 ENCOUNTER — Telehealth: Payer: Self-pay | Admitting: Gynecology

## 2013-03-13 NOTE — Telephone Encounter (Signed)
Voicemail confirmed patient name//left message for patient to call back to discuss insurance information for surgery//ssf

## 2013-03-14 ENCOUNTER — Ambulatory Visit (INDEPENDENT_AMBULATORY_CARE_PROVIDER_SITE_OTHER): Payer: BC Managed Care – PPO | Admitting: Gynecology

## 2013-03-14 ENCOUNTER — Encounter: Payer: Self-pay | Admitting: Gynecology

## 2013-03-14 VITALS — BP 116/72 | HR 78 | Resp 18 | Ht 64.0 in | Wt 325.0 lb

## 2013-03-14 DIAGNOSIS — N920 Excessive and frequent menstruation with regular cycle: Secondary | ICD-10-CM

## 2013-03-14 DIAGNOSIS — N921 Excessive and frequent menstruation with irregular cycle: Secondary | ICD-10-CM

## 2013-03-14 DIAGNOSIS — Z9884 Bariatric surgery status: Secondary | ICD-10-CM

## 2013-03-14 DIAGNOSIS — D638 Anemia in other chronic diseases classified elsewhere: Secondary | ICD-10-CM

## 2013-03-14 DIAGNOSIS — K909 Intestinal malabsorption, unspecified: Secondary | ICD-10-CM

## 2013-03-14 LAB — MAGNESIUM: MAGNESIUM: 1.9 mg/dL (ref 1.5–2.5)

## 2013-03-14 LAB — COMPREHENSIVE METABOLIC PANEL
ALBUMIN: 4.2 g/dL (ref 3.5–5.2)
ALT: 17 U/L (ref 0–35)
AST: 17 U/L (ref 0–37)
Alkaline Phosphatase: 66 U/L (ref 39–117)
BUN: 16 mg/dL (ref 6–23)
CALCIUM: 9 mg/dL (ref 8.4–10.5)
CHLORIDE: 105 meq/L (ref 96–112)
CO2: 24 mEq/L (ref 19–32)
Creat: 0.58 mg/dL (ref 0.50–1.10)
GLUCOSE: 41 mg/dL — AB (ref 70–99)
POTASSIUM: 3.8 meq/L (ref 3.5–5.3)
Sodium: 137 mEq/L (ref 135–145)
Total Bilirubin: 0.2 mg/dL — ABNORMAL LOW (ref 0.3–1.2)
Total Protein: 6.9 g/dL (ref 6.0–8.3)

## 2013-03-14 LAB — VITAMIN B12: VITAMIN B 12: 290 pg/mL (ref 211–911)

## 2013-03-14 NOTE — Progress Notes (Signed)
38 y.o.MarriedCaucasian [phone] female here for consideration for robotically assisted TLH  with Bilateral salpingectomy and without removal of ovaries.    She complains of menorrhagia and anemia.  Pt had been scheduled 11/14 but was canceled due to poor response to Luporn.  Pt was severely anemic and flet to be a poor surgical candidate.  She has since been getting IV iron infusion and her last Hb 11.  Pt also has malnutrition due to gastric bypass and lack of follow up after move [place]. Pt's insurance does not cover nutritional support, so she has not been able to follow up. Pt had been put on micronor to control menorrhagia but pt states that it had no affect on her menses and she did not use other than the first month, she did not contact the office in this regard.  Pt states that she had no cycle on Lupron the next 2 were light but the last 2 were very heavy with clots.  Pt flows for 6d, 4.5d heavy.  Her hematologist has given her one more infusion and cleared her for surgery.  Pt would like to proceed with TLH. PUS on 6/14 showed the uterus to be normal and contain 0 fibroids,  Adnexa Normal.  Endometrial biopsy on {6/14 was proliferative with breakdown      The current method of family planning is tubal ligation.     Hormones:none   reports that she has never smoked. She has never used smokeless tobacco. She reports that she does not drink alcohol or use illicit drugs.  @CHLEC1NM @  Health Maintenance  Topic Date Due  . Pap Smear  01/30/1995  . Tetanus/tdap  01/30/1996  . Influenza Vaccine  09/27/2012    No family history on file.  Patient Active Problem List   Diagnosis Date Noted  . 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 Chronic anemia 01/11/2012  . S/P cesarean section (11/12, rpt & BTL) 01/10/2012  . Postpartum care following cesarean delivery 01/10/2012  . Chronic hypertension complicating or reason  for care during pregnancy 01/10/2012  . Maternal anemia complicating pregnancy, childbirth, or the puerperium 01/10/2012  . Morbidly obese 01/10/2012  . LBBB (left bundle branch block) 09/25/2011  . MVP (mitral valve prolapse) 09/25/2011  . Pregnancy 09/25/2011    Past Medical History  Diagnosis Date  . Obesity   . Anemia   . Depression     history of depression- none now  . 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 Heartburn in pregnancy   . Complication of anesthesia     pt states she was given too much anesthesia   . PONV (postoperative nausea and vomiting)   . S/P cesarean section (11/12, rpt, suspect macrosomia) 01/10/2012  . GDM, class A2 01/10/2012  . Postpartum care following cesarean delivery 01/10/2012  . Chronic hypertension complicating or reason for care during pregnancy 01/10/2012  . Maternal anemia complicating pregnancy, childbirth, or the puerperium 01/10/2012  . Morbidly obese 01/10/2012  . Chronic anemia 01/11/2012  . Anxiety   . Dysmenorrhea   . Infertility, female     Past Surgical History  Procedure Laterality Date  . Gastric bypass      status Post  . Cardiac catheterization  11-07-2002    The left anterior descending artery is smooth and normal. There are several moderate sized diagonal branch  which are normal. The left circumflex artery is normal. It is a very large vessel. The right coronary artery is large and dominant. EF 65%. Smooth and normal coronary arteries. Normal left ventricular systolic function.   . Cardiovascular stress test  2004    revealed anterior apical ischemia.  . Cesarean section    . Cesarean section with bilateral tubal ligation  01/09/2012    Procedure: CESAREAN SECTION WITH BILATERAL TUBAL LIGATION;  Surgeon: Princess Bruins, MD;  Location: [facility];  Service: Obstetrics;  Laterality: Bilateral;  Repeat C/S   EDD: 01/11/12    Allergies: @RRALLERGIES @  Current Outpatient Prescriptions   Medication Sig Dispense Refill  . FLUoxetine (PROZAC) 20 MG capsule Take 20 mg by mouth daily.      . norethindrone (ORTHO MICRONOR) 0.35 MG tablet Take 1 tablet (0.35 mg total) by mouth daily.  3 Package  3   Current Facility-Administered Medications  Medication Dose Route Frequency Provider Last Rate Last Dose  . leuprolide (LUPRON) injection 3.75 mg  3.75 mg Intramuscular Once Azalia Bilis, MD          Exam:    There were no vitals taken for this visit.  General appearance: alert, cooperative and appears stated age Head: Normocephalic, without obvious abnormality, atraumatic Neck: no adenopathy, no carotid bruit, no JVD, supple, symmetrical, trachea midline and thyroid not enlarged, symmetric, no tenderness/mass/nodules Lungs: clear to auscultation bilaterally Heart: regular rate and rhythm, S1, S2 normal, no murmur, click, rub or gallop Abdomen: soft, non-tender; bowel sounds normal; no masses,  no organomegaly Extremities: extremities normal, atraumatic, no cyanosis or edema Lymph nodes: Inguinal adenopathy: absent no inguinal nodes palpated Neurologic: Grossly normal   Pelvic: External genitalia:  no lesions              Bartholins and Skenes: normal                 Vagina: normal appearing vagina with normal color and discharge, no lesions              Cervix: normal appearance                     Bimanual Exam:  Uterus:  uterus is normal size, shape, consistency and nontender, limited by habitus                                      Adnexa:    no masses and limited by habitus                                       A/P: We discussed alternatives to TLH of ablation which has risks due to 2 c/s scars and IUD placement, she would prefer definitive surgery.The planned procedure was discussed with the patient.  Pre-op instructions, hospital course, and post-op instructions reviewed.  Post op instruction booklet given and reviewed.  Risks and possible complications discussed,  including but not limited to, bleeding and possible transfusion; infection; anesthesia complications; injury to visceral organ requiring further surgery, either immediately or delayed; wound complications including infection, blood collections, and bowel herniation; allergic reactions; swelling of the face due to prolonged positioning;  VTE or air emboli; nerve injury related to prolonged positioning; even death.   Pt will get cardiac clearance from Dr Ernie Hew who cleared her  prior to her c/s Pt is aware that as a result of her open Roux en y there may be additional upper abdominal scarring-case reviewed with Dr Hassell Done who did not do her surgery, we will plan upper quadrant insufflation.  Pt is aware that there is a risk of conversion to open if scarring is extensive. The patient was given the opportunity to watch the informed consent video on hysterectomy.  Her questions were invited and answered.  The patient states she understands the risks and possible complications, and wishes to proceed as planned.  We will check CMET and Mg levels for evaluation of nutritional status. We will schedule.

## 2013-03-19 NOTE — Telephone Encounter (Signed)
Dr Charlies Constable, how do you want this case posted, please advise.

## 2013-03-19 NOTE — Telephone Encounter (Signed)
Patient returned call and was advised of insurance quoted liability for surgery of $100. Advised patient that we collect this payment in full at least 2 weeks prior to the surgery date. Also advised patient that Gay Filler will be contacting her to schedule the surgery.Edd Fabian

## 2013-03-20 ENCOUNTER — Telehealth: Payer: Self-pay | Admitting: Gynecology

## 2013-03-20 NOTE — Telephone Encounter (Addendum)
Patient says she is ready to schedule surgery. °

## 2013-03-20 NOTE — Telephone Encounter (Signed)
Lm regarding her surgery

## 2013-03-20 NOTE — Telephone Encounter (Signed)
Pt called back, explained that I prefer to have Dr Sabra Heck be the primary on her hysterectomy and I will assist, Dr Sabra Heck has agreed and will need to see her before scheduling.  Pt is agreeable and we will have the office set up her appt to meet Dr Sabra Heck

## 2013-03-21 ENCOUNTER — Telehealth: Payer: Self-pay | Admitting: *Deleted

## 2013-03-21 NOTE — Telephone Encounter (Signed)
Call to patient to schedule OV with Dr Sabra Heck to discuss surgical options.  Patient very anxious to proceed and will take anything ASAP, first avail, Mon 03-24-13 at 0930.Will schedule first available surgical date but stressed to patietn that this is pending Dr Ammie Ferrier exam/consult on Monday.  Patient agreeable.  Routing to provider for final review. Patient agreeable to disposition. Will close encounter

## 2013-03-24 ENCOUNTER — Telehealth: Payer: Self-pay | Admitting: *Deleted

## 2013-03-24 ENCOUNTER — Encounter: Payer: Self-pay | Admitting: Obstetrics & Gynecology

## 2013-03-24 ENCOUNTER — Ambulatory Visit (INDEPENDENT_AMBULATORY_CARE_PROVIDER_SITE_OTHER): Payer: BC Managed Care – PPO | Admitting: Obstetrics & Gynecology

## 2013-03-24 VITALS — BP 112/60 | HR 68 | Resp 20 | Ht 63.75 in | Wt 323.4 lb

## 2013-03-24 DIAGNOSIS — Z9884 Bariatric surgery status: Secondary | ICD-10-CM

## 2013-03-24 DIAGNOSIS — N920 Excessive and frequent menstruation with regular cycle: Secondary | ICD-10-CM

## 2013-03-24 DIAGNOSIS — I447 Left bundle-branch block, unspecified: Secondary | ICD-10-CM

## 2013-03-24 DIAGNOSIS — Z98891 History of uterine scar from previous surgery: Secondary | ICD-10-CM

## 2013-03-24 DIAGNOSIS — Z124 Encounter for screening for malignant neoplasm of cervix: Secondary | ICD-10-CM

## 2013-03-24 DIAGNOSIS — Z9889 Other specified postprocedural states: Secondary | ICD-10-CM

## 2013-03-24 DIAGNOSIS — Z862 Personal history of diseases of the blood and blood-forming organs and certain disorders involving the immune mechanism: Secondary | ICD-10-CM

## 2013-03-24 MED ORDER — OXYCODONE HCL 5 MG PO TABS: ORAL_TABLET | ORAL | Status: DC

## 2013-03-24 NOTE — Telephone Encounter (Signed)
See phone note from 03-21-13

## 2013-03-24 NOTE — Telephone Encounter (Signed)
Call to patient regarding surgery date of 04-07-13 at 0730 at [facility].  VM has name confirmation so left message with date and call back tomm for additional info.

## 2013-03-24 NOTE — Telephone Encounter (Signed)
See next OV notes.

## 2013-03-24 NOTE — Telephone Encounter (Signed)
Routing to provider for final review. Patient agreeable to disposition. Will close encounter.     

## 2013-03-24 NOTE — Progress Notes (Signed)
38 y.o. [id number] MarriedCaucasian female here for discussion of upcoming procedure.  Robotic TLH with bilateral salpingectomy, possible TAH planned due to menorrhagia with resulting severe anemia.  Hemoglobin has been as low as 6.6.  She has been on Depo Lupron with good success and is now back to normal from a hemoglobin standpoint.  Due to the anemia she has received transfusions twice, with one unit received each time.  She has been seen by hematology who highly recommends she proceed with hysterectomy at this time as well.  Pre-op evaluation thus far has included TVUS and endometrial biopsy 6/14.  Ultrasound showed uterus 8.3 x 5.7 x 4.5cm and endometrial biopsy showed proliferative endometrium.  Left ovary contained a 4cm cystic lesion with septation but avascular.  History reviewed with patient in particular bariatric and cardiac.  Patient has h/o palpitations after losing 200 pounds from gastric bypass.  Was seen by Dr. Cathie Olden.  Had cardiac cath, echo, and EKG.  Left BBB found.  During pregnancy, Dr. Cathie Olden cleared her "natural child birth" but patient ended up with emergency c-section with first pregnancy.  Second was also cesarean section but pt had hoped to try a VBAC.  OB thought second baby was going to be large so she ended up with a scheduled cesarean section.  All cardiac evaluations are documented in EPIC in media tab and then with Dr. Julious Payer evaluation.  Procedure discussed with patient.  Hospital stay, recovery and pain management all discussed.  Risks discussed including but not limited to bleeding, 1% risk of receiving a  transfusion, infection, 3-4% risk of bowel/bladder/ureteral/vascular injury discussed as well as possible need for additional surgery if injury does occur discussed.  DVT/PE and rare risk of death discussed.  My actual complications with prior surgeries discussed.  Vaginal cuff dehiscence discussed.  Hernia formation discussed.  Positioning and incision locations discussed.   Nerve injury at neck specifically discussed.  IV access discussed.  Pt aware may need central line.  TAH discussed.  Possible removal of ovary discussed.  Pt aware plan will be to removed tubes but to leave ovaries if they appear normal.  Patient aware if pathology abnormal she may need additional treatment.  All questions answered.    Ob Hx:   Patient's last menstrual period was 02/28/2013.          Sexually active: yes Birth control: bilateral tubal ligation Last pap: Last MMG: Tobacco:  Past Surgical History  Procedure Laterality Date  . Gastric bypass      status Post  . Cardiac catheterization  11-07-2002    The left anterior descending artery is smooth and normal. There are several moderate sized diagonal branch which are normal. The left circumflex artery is normal. It is a very large vessel. The right coronary artery is large and dominant. EF 65%. Smooth and normal coronary arteries. Normal left ventricular systolic function.   . Cardiovascular stress test  2004    revealed anterior apical ischemia.  . Cesarean section    . Cesarean section with bilateral tubal ligation  01/09/2012    Procedure: CESAREAN SECTION WITH BILATERAL TUBAL LIGATION;  Surgeon: Princess Bruins, MD;  Location: [facility];  Service: Obstetrics;  Laterality: Bilateral;  Repeat C/S   EDD: 01/11/12    Past Medical History  Diagnosis Date  . Obesity   . Anemia   . Depression     history of depression- none now  . LBBB (left bundle branch block)     pt states she was  born with it  . Heartburn in pregnancy   . Complication of anesthesia     pt states she was given too much anesthesia   . PONV (postoperative nausea and vomiting)   . S/P cesarean section (11/12, rpt, suspect macrosomia) 01/10/2012  . GDM, class A2 01/10/2012  . Postpartum care following cesarean delivery 01/10/2012  . Chronic hypertension complicating or reason for care during pregnancy 01/10/2012  . Maternal anemia complicating pregnancy,  childbirth, or the puerperium 01/10/2012  . Morbidly obese 01/10/2012  . Chronic anemia 01/11/2012  . Anxiety   . Dysmenorrhea   . Infertility, female     Allergies: Adhesive and Amoxicillin  Current Outpatient Prescriptions  Medication Sig Dispense Refill  . FLUoxetine (PROZAC) 20 MG capsule Take 20 mg by mouth daily.      . norethindrone (ORTHO MICRONOR) 0.35 MG tablet Take 1 tablet (0.35 mg total) by mouth daily.  3 Package  3   Current Facility-Administered Medications  Medication Dose Route Frequency Provider Last Rate Last Dose  . leuprolide (LUPRON) injection 3.75 mg  3.75 mg Intramuscular Once Azalia Bilis, MD        ROS: A comprehensive review of systems was negative.  Exam:    BP 112/60  Pulse 68  Resp 20  Ht 5' 3.75" (1.619 m)  Wt 323 lb 6.4 oz (146.693 kg)  BMI 55.96 kg/m2  LMP 02/28/2013  General appearance: alert and cooperative Head: Normocephalic, without obvious abnormality, atraumatic Neck: no adenopathy, supple, symmetrical, trachea midline and thyroid not enlarged, symmetric, no tenderness/mass/nodules Lungs: clear to auscultation bilaterally Heart: regular rate and rhythm, S1, S2 normal, no murmur, click, rub or gallop Abdomen: soft, non-tender; bowel sounds normal; no masses,  no organomegaly Extremities: extremities normal, atraumatic, no cyanosis or edema Skin: Skin color, texture, turgor normal. No rashes or lesions Lymph nodes: Cervical, supraclavicular, and axillary nodes normal. no inguinal nodes palpated Neurologic: Grossly normal  Pelvic: External genitalia:  no lesions              Urethra: normal appearing urethra with no masses, tenderness or lesions              Bartholins and Skenes: Bartholin's, Urethra, Skene's normal                 Vagina: normal appearing vagina with normal color and discharge, no lesions              Cervix: normal appearance              Pap taken: yes        Bimanual Exam:  Uterus:  uterus is normal size,  shape, consistency and nontender                                      Adnexa:    normal adnexa in size, nontender and no masses                                      Rectovaginal: Deferred                                      Anus:  defer exam  A: H/O anemia, transfusions x 2, normal hemoglobin.  (Neg  eval by hematology) Morbid obesity H/O MTHRF abnormality--heterozygous.  H/O 2 SABs.  No additional anticoagulation indicated for this finding. 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  Pt initially lost over 200 pounds but has regained almost 100 with childbearing H/O Left BBB with thorough cardiac evaluation in past H/O 2 prior cesarean sections  P:  Robotic TLH with bilateral salpingectomy possible oophrectomy, possible TAH planned.  Pt very aware of positioning and possibility that she may need to have an open procedure. Verbal consent for blood products obtained in office.  Risks of transfusion reaction, blood incompatibility discussed, HIV and Hep C risks discussed.  Pt ok with receiving blood products if needed. Pap obtained today. Will determine best pain medications while in hospital. Medications/Vitamins reviewed. Hysterectomy brochure given for pre and post op instructions.  ~60 minutes spent with patient >50% of time was in face to face discussion of above.  Very lengthy visit regarding procedure, risks, history, and possible complications.

## 2013-03-25 ENCOUNTER — Encounter (HOSPITAL_COMMUNITY): Payer: Self-pay | Admitting: Pharmacist

## 2013-03-26 LAB — IPS PAP TEST WITH HPV

## 2013-03-28 NOTE — Telephone Encounter (Signed)
Call to patient and reviewed surgery instructions.  Post op appointments scheduled and instruction sheet mailed.  Call prn.  Routing to provider for final review. Patient agreeable to disposition. Will close encounter

## 2013-03-31 ENCOUNTER — Telehealth: Payer: Self-pay | Admitting: Obstetrics & Gynecology

## 2013-03-31 ENCOUNTER — Telehealth: Payer: Self-pay | Admitting: *Deleted

## 2013-03-31 NOTE — Telephone Encounter (Signed)
[name] called to pay patient liability of $100 for [name]'s surgery scheduled 02.09.2015. Approval Code [id number].//ssf

## 2013-03-31 NOTE — Telephone Encounter (Signed)
Hospital calling for orders, has PAT appt tomorrow.  Routing to provider for final review. Patient agreeable to disposition. Will close encounter

## 2013-04-01 ENCOUNTER — Encounter (HOSPITAL_COMMUNITY): Payer: Self-pay

## 2013-04-01 ENCOUNTER — Encounter (HOSPITAL_COMMUNITY)
Admission: RE | Admit: 2013-04-01 | Discharge: 2013-04-01 | Disposition: A | Discharge status: Home / Self Care | Payer: BC Managed Care – PPO | Source: Ambulatory Visit | Attending: Obstetrics & Gynecology | Admitting: Obstetrics & Gynecology

## 2013-04-01 ENCOUNTER — Encounter: Payer: Self-pay | Admitting: Obstetrics & Gynecology

## 2013-04-01 DIAGNOSIS — Z01812 Encounter for preprocedural laboratory examination: Secondary | ICD-10-CM | POA: Insufficient documentation

## 2013-04-01 DIAGNOSIS — N920 Excessive and frequent menstruation with regular cycle: Secondary | ICD-10-CM | POA: Insufficient documentation

## 2013-04-01 LAB — CBC
HEMATOCRIT: 37.4 % (ref 36.0–46.0)
HEMOGLOBIN: 12.1 g/dL (ref 12.0–15.0)
MCH: 26 pg (ref 26.0–34.0)
MCHC: 32.4 g/dL (ref 30.0–36.0)
MCV: 80.4 fL (ref 78.0–100.0)
Platelets: 294 10*3/uL (ref 150–400)
RBC: 4.65 MIL/uL (ref 3.87–5.11)
RDW: 22.5 % — ABNORMAL HIGH (ref 11.5–15.5)
WBC: 5.6 10*3/uL (ref 4.0–10.5)

## 2013-04-01 LAB — BASIC METABOLIC PANEL
BUN: 12 mg/dL (ref 6–23)
CALCIUM: 8.9 mg/dL (ref 8.4–10.5)
CO2: 26 mEq/L (ref 19–32)
Chloride: 103 mEq/L (ref 96–112)
Creatinine, Ser: 0.61 mg/dL (ref 0.50–1.10)
GFR calc Af Amer: >90 mL/min (ref >90)
GLUCOSE: 77 mg/dL (ref 70–99)
Potassium: 4.2 mEq/L (ref 3.7–5.3)
Sodium: 140 mEq/L (ref 137–147)

## 2013-04-01 NOTE — Patient Instructions (Signed)
[facility]  04/01/2013   Your procedure is scheduled on:  04/07/13  Enter through the Main Entrance of [facility] up the phone at the desk and dial [phone].   Call this number if you have problems the morning of surgery: [phone]   Remember:   Do not eat food:After Midnight.  Do not drink clear liquids: After Midnight.  Take these medicines the morning of surgery with A SIP OF WATER: NA   Do not wear jewelry, make-up or nail polish.  Do not wear lotions, powders, or perfumes. You may wear deodorant.  Do not shave 48 hours prior to surgery.  Do not bring valuables to the hospital.  [facility] is not   responsible for any belongings or valuables brought to the hospital.  Contacts, dentures or bridgework may not be worn into surgery.  Leave suitcase in the car. After surgery it may be brought to your room.  For patients admitted to the hospital, checkout time is 11:00 AM the day of              discharge.   Patients discharged the day of surgery will not be allowed to drive             home.  Name and phone number of your driver: NA  Special Instructions:   Shower using CHG 2 nights before surgery and the night before surgery.  If you shower the day of surgery use CHG.  Use special wash - you have one bottle of CHG for all showers.  You should use approximately 1/3 of the bottle for each shower.   Please read over the following fact sheets that you were given:   Surgical Site Infection Prevention

## 2013-04-06 MED ORDER — METRONIDAZOLE IN NACL 5-0.79 MG/ML-% IV SOLN
500.0000 mg | INTRAVENOUS | Status: AC
  Administered 2013-04-07: 500 mg via INTRAVENOUS
  Filled 2013-04-06: qty 100

## 2013-04-06 MED ORDER — CIPROFLOXACIN IN D5W 400 MG/200ML IV SOLN
400.0000 mg | INTRAVENOUS | Status: AC
  Administered 2013-04-07: 400 mg via INTRAVENOUS
  Filled 2013-04-06: qty 200

## 2013-04-07 ENCOUNTER — Encounter (HOSPITAL_COMMUNITY)
Admission: RE | Disposition: A | Discharge status: Home / Self Care | Payer: Self-pay | Source: Ambulatory Visit | Attending: Obstetrics & Gynecology

## 2013-04-07 ENCOUNTER — Ambulatory Visit (HOSPITAL_COMMUNITY): Payer: BC Managed Care – PPO | Admitting: Anesthesiology

## 2013-04-07 ENCOUNTER — Ambulatory Visit (HOSPITAL_COMMUNITY)
Admission: RE | Admit: 2013-04-07 | Discharge: 2013-04-07 | Disposition: A | Discharge status: Home / Self Care | Payer: BC Managed Care – PPO | Source: Ambulatory Visit | Attending: Obstetrics & Gynecology | Admitting: Obstetrics & Gynecology

## 2013-04-07 ENCOUNTER — Encounter (HOSPITAL_COMMUNITY): Payer: Self-pay | Admitting: Registered Nurse

## 2013-04-07 ENCOUNTER — Encounter (HOSPITAL_COMMUNITY): Payer: BC Managed Care – PPO | Admitting: Anesthesiology

## 2013-04-07 DIAGNOSIS — D638 Anemia in other chronic diseases classified elsewhere: Secondary | ICD-10-CM

## 2013-04-07 DIAGNOSIS — I10 Essential (primary) hypertension: Secondary | ICD-10-CM | POA: Insufficient documentation

## 2013-04-07 DIAGNOSIS — Z862 Personal history of diseases of the blood and blood-forming organs and certain disorders involving the immune mechanism: Secondary | ICD-10-CM

## 2013-04-07 DIAGNOSIS — N946 Dysmenorrhea, unspecified: Secondary | ICD-10-CM | POA: Insufficient documentation

## 2013-04-07 DIAGNOSIS — N920 Excessive and frequent menstruation with regular cycle: Secondary | ICD-10-CM | POA: Insufficient documentation

## 2013-04-07 DIAGNOSIS — I447 Left bundle-branch block, unspecified: Secondary | ICD-10-CM | POA: Insufficient documentation

## 2013-04-07 DIAGNOSIS — N938 Other specified abnormal uterine and vaginal bleeding: Secondary | ICD-10-CM | POA: Insufficient documentation

## 2013-04-07 DIAGNOSIS — N925 Other specified irregular menstruation: Secondary | ICD-10-CM | POA: Insufficient documentation

## 2013-04-07 DIAGNOSIS — N949 Unspecified condition associated with female genital organs and menstrual cycle: Secondary | ICD-10-CM | POA: Insufficient documentation

## 2013-04-07 DIAGNOSIS — Z9884 Bariatric surgery status: Secondary | ICD-10-CM

## 2013-04-07 DIAGNOSIS — Z1589 Genetic susceptibility to other disease: Secondary | ICD-10-CM | POA: Insufficient documentation

## 2013-04-07 HISTORY — PX: CYSTOSCOPY: SHX5120

## 2013-04-07 HISTORY — PX: ROBOTIC ASSISTED TOTAL HYSTERECTOMY: SHX6085

## 2013-04-07 LAB — PREGNANCY, URINE: Preg Test, Ur: NEGATIVE

## 2013-04-07 LAB — TYPE AND SCREEN
ABO/RH(D): A POS
Antibody Screen: NEGATIVE

## 2013-04-07 LAB — HEMOGLOBIN: Hemoglobin: 12.2 g/dL (ref 12.0–15.0)

## 2013-04-07 SURGERY — ROBOTIC ASSISTED TOTAL HYSTERECTOMY
Anesthesia: General | Site: Urethra

## 2013-04-07 MED ORDER — DEXAMETHASONE SODIUM PHOSPHATE 10 MG/ML IJ SOLN
INTRAMUSCULAR | Status: AC
  Filled 2013-04-07: qty 1

## 2013-04-07 MED ORDER — ROCURONIUM BROMIDE 100 MG/10ML IV SOLN
INTRAVENOUS | Status: AC
  Filled 2013-04-07: qty 1

## 2013-04-07 MED ORDER — ACETAMINOPHEN 10 MG/ML IV SOLN
1000.0000 mg | Freq: Once | INTRAVENOUS | Status: AC
  Administered 2013-04-07: 1000 mg via INTRAVENOUS
  Filled 2013-04-07: qty 100

## 2013-04-07 MED ORDER — MORPHINE SULFATE 4 MG/ML IJ SOLN
2.0000 mg | INTRAMUSCULAR | Status: DC | PRN
  Administered 2013-04-07: 2 mg via INTRAVENOUS
  Filled 2013-04-07: qty 1

## 2013-04-07 MED ORDER — GLYCOPYRROLATE 0.2 MG/ML IJ SOLN
INTRAMUSCULAR | Status: AC
  Filled 2013-04-07: qty 1

## 2013-04-07 MED ORDER — MENTHOL 3 MG MT LOZG: 1.0000 | LOZENGE | OROMUCOSAL | Status: DC | PRN

## 2013-04-07 MED ORDER — SUCCINYLCHOLINE CHLORIDE 20 MG/ML IJ SOLN
INTRAMUSCULAR | Status: DC | PRN
  Administered 2013-04-07: 20 mg via INTRAVENOUS
  Administered 2013-04-07: 140 mg via INTRAVENOUS

## 2013-04-07 MED ORDER — TEMAZEPAM 15 MG PO CAPS: 15.0000 mg | ORAL_CAPSULE | Freq: Every evening | ORAL | Status: DC | PRN

## 2013-04-07 MED ORDER — ONDANSETRON HCL 4 MG/2ML IJ SOLN
INTRAMUSCULAR | Status: DC | PRN
  Administered 2013-04-07: 4 mg via INTRAVENOUS

## 2013-04-07 MED ORDER — ARTIFICIAL TEARS OP OINT
TOPICAL_OINTMENT | OPHTHALMIC | Status: DC | PRN
  Administered 2013-04-07: 1 via OPHTHALMIC

## 2013-04-07 MED ORDER — GLYCOPYRROLATE 0.2 MG/ML IJ SOLN
INTRAMUSCULAR | Status: AC
  Filled 2013-04-07: qty 3

## 2013-04-07 MED ORDER — HYDROMORPHONE HCL PF 1 MG/ML IJ SOLN
INTRAMUSCULAR | Status: AC
  Filled 2013-04-07: qty 1

## 2013-04-07 MED ORDER — PROMETHAZINE HCL 25 MG/ML IJ SOLN
6.2500 mg | INTRAMUSCULAR | Status: DC | PRN
  Administered 2013-04-07: 6.25 mg via INTRAVENOUS

## 2013-04-07 MED ORDER — FENTANYL CITRATE 0.05 MG/ML IJ SOLN
INTRAMUSCULAR | Status: DC | PRN
  Administered 2013-04-07: 50 ug via INTRAVENOUS
  Administered 2013-04-07: 100 ug via INTRAVENOUS
  Administered 2013-04-07: 25 ug via INTRAVENOUS
  Administered 2013-04-07: 50 ug via INTRAVENOUS
  Administered 2013-04-07: 150 ug via INTRAVENOUS
  Administered 2013-04-07: 50 ug via INTRAVENOUS
  Administered 2013-04-07: 25 ug via INTRAVENOUS
  Administered 2013-04-07: 50 ug via INTRAVENOUS

## 2013-04-07 MED ORDER — OXYCODONE-ACETAMINOPHEN 5-325 MG PO TABS
1.0000 | ORAL_TABLET | ORAL | Status: DC | PRN
  Administered 2013-04-07: 1 via ORAL
  Filled 2013-04-07: qty 1

## 2013-04-07 MED ORDER — LACTATED RINGERS IV SOLN: INTRAVENOUS | Status: DC

## 2013-04-07 MED ORDER — HYDROMORPHONE HCL PF 1 MG/ML IJ SOLN
0.2500 mg | INTRAMUSCULAR | Status: DC | PRN
  Administered 2013-04-07: 0.5 mg via INTRAVENOUS

## 2013-04-07 MED ORDER — FENTANYL CITRATE 0.05 MG/ML IJ SOLN
INTRAMUSCULAR | Status: AC
  Filled 2013-04-07: qty 5

## 2013-04-07 MED ORDER — MIDAZOLAM HCL 5 MG/5ML IJ SOLN
INTRAMUSCULAR | Status: DC | PRN
  Administered 2013-04-07: 2 mg via INTRAVENOUS

## 2013-04-07 MED ORDER — DEXAMETHASONE SODIUM PHOSPHATE 10 MG/ML IJ SOLN
INTRAMUSCULAR | Status: DC | PRN
  Administered 2013-04-07: 10 mg via INTRAVENOUS

## 2013-04-07 MED ORDER — ALUM & MAG HYDROXIDE-SIMETH 200-200-20 MG/5ML PO SUSP: 30.0000 mL | ORAL | Status: DC | PRN

## 2013-04-07 MED ORDER — PANTOPRAZOLE SODIUM 40 MG IV SOLR
40.0000 mg | Freq: Every day | INTRAVENOUS | Status: DC
  Filled 2013-04-07: qty 40

## 2013-04-07 MED ORDER — OXYCODONE HCL 5 MG PO TABS: ORAL_TABLET | ORAL | Status: DC

## 2013-04-07 MED ORDER — LACTATED RINGERS IV SOLN
INTRAVENOUS | Status: DC
  Administered 2013-04-07 (×2): via INTRAVENOUS

## 2013-04-07 MED ORDER — MEPERIDINE HCL 25 MG/ML IJ SOLN: 6.2500 mg | INTRAMUSCULAR | Status: DC | PRN

## 2013-04-07 MED ORDER — PROMETHAZINE HCL 25 MG/ML IJ SOLN
INTRAMUSCULAR | Status: AC
  Administered 2013-04-07: 6.25 mg via INTRAVENOUS
  Filled 2013-04-07: qty 1

## 2013-04-07 MED ORDER — LACTATED RINGERS IR SOLN
Status: DC | PRN
  Administered 2013-04-07: 3000 mL

## 2013-04-07 MED ORDER — PROPOFOL 10 MG/ML IV EMUL
INTRAVENOUS | Status: AC
  Filled 2013-04-07: qty 20

## 2013-04-07 MED ORDER — LIDOCAINE HCL (CARDIAC) 20 MG/ML IV SOLN
INTRAVENOUS | Status: DC | PRN
  Administered 2013-04-07: 100 mg via INTRAVENOUS

## 2013-04-07 MED ORDER — ROCURONIUM BROMIDE 100 MG/10ML IV SOLN
INTRAVENOUS | Status: DC | PRN
  Administered 2013-04-07: 60 mg via INTRAVENOUS
  Administered 2013-04-07: 10 mg via INTRAVENOUS

## 2013-04-07 MED ORDER — ROPIVACAINE HCL 5 MG/ML IJ SOLN
INTRAMUSCULAR | Status: AC
  Filled 2013-04-07: qty 60

## 2013-04-07 MED ORDER — NEOSTIGMINE METHYLSULFATE 1 MG/ML IJ SOLN
INTRAMUSCULAR | Status: DC | PRN
  Administered 2013-04-07: 3 mg via INTRAVENOUS

## 2013-04-07 MED ORDER — ROPIVACAINE HCL 5 MG/ML IJ SOLN
INTRAMUSCULAR | Status: DC | PRN
  Administered 2013-04-07: 70 mL

## 2013-04-07 MED ORDER — KETOROLAC TROMETHAMINE 30 MG/ML IJ SOLN
INTRAMUSCULAR | Status: DC | PRN
  Administered 2013-04-07: 30 mg via INTRAVENOUS

## 2013-04-07 MED ORDER — KETOROLAC TROMETHAMINE 30 MG/ML IJ SOLN
INTRAMUSCULAR | Status: AC
  Filled 2013-04-07: qty 1

## 2013-04-07 MED ORDER — ACETAMINOPHEN 325 MG PO TABS
650.0000 mg | ORAL_TABLET | ORAL | Status: DC | PRN
Start: 2013-04-07 — End: 2013-04-07

## 2013-04-07 MED ORDER — LACTATED RINGERS IV SOLN
INTRAVENOUS | Status: DC | PRN
  Administered 2013-04-07: 08:00:00 via INTRAVENOUS

## 2013-04-07 MED ORDER — PROPOFOL 10 MG/ML IV BOLUS
INTRAVENOUS | Status: DC | PRN
  Administered 2013-04-07: 200 mg via INTRAVENOUS

## 2013-04-07 MED ORDER — NEOSTIGMINE METHYLSULFATE 1 MG/ML IJ SOLN
INTRAMUSCULAR | Status: AC
  Filled 2013-04-07: qty 1

## 2013-04-07 MED ORDER — ONDANSETRON HCL 4 MG/2ML IJ SOLN
INTRAMUSCULAR | Status: AC
  Filled 2013-04-07: qty 2

## 2013-04-07 MED ORDER — DEXTROSE-NACL 5-0.45 % IV SOLN
INTRAVENOUS | Status: DC
Start: 2013-04-07 — End: 2013-04-07

## 2013-04-07 MED ORDER — SODIUM CHLORIDE 0.9 % IJ SOLN
INTRAMUSCULAR | Status: AC
  Filled 2013-04-07: qty 10

## 2013-04-07 MED ORDER — ARTIFICIAL TEARS OP OINT
TOPICAL_OINTMENT | OPHTHALMIC | Status: AC
  Filled 2013-04-07: qty 3.5

## 2013-04-07 MED ORDER — SUCCINYLCHOLINE CHLORIDE 20 MG/ML IJ SOLN
INTRAMUSCULAR | Status: AC
  Filled 2013-04-07: qty 10

## 2013-04-07 MED ORDER — SODIUM CHLORIDE 0.9 % IJ SOLN
INTRAMUSCULAR | Status: AC
  Filled 2013-04-07: qty 50

## 2013-04-07 MED ORDER — STERILE WATER FOR IRRIGATION IR SOLN
Status: DC | PRN
  Administered 2013-04-07: 1000 mL via INTRAVESICAL

## 2013-04-07 MED ORDER — SIMETHICONE 80 MG PO CHEW
80.0000 mg | CHEWABLE_TABLET | Freq: Four times a day (QID) | ORAL | Status: DC | PRN
  Administered 2013-04-07: 80 mg via ORAL
  Filled 2013-04-07: qty 1

## 2013-04-07 MED ORDER — GLYCOPYRROLATE 0.2 MG/ML IJ SOLN
INTRAMUSCULAR | Status: DC | PRN
  Administered 2013-04-07: 0.6 mg via INTRAVENOUS

## 2013-04-07 MED ORDER — MIDAZOLAM HCL 2 MG/2ML IJ SOLN
INTRAMUSCULAR | Status: AC
  Filled 2013-04-07: qty 2

## 2013-04-07 MED ORDER — FLUOXETINE HCL 20 MG PO CAPS
20.0000 mg | ORAL_CAPSULE | Freq: Every day | ORAL | Status: DC
  Administered 2013-04-07: 20 mg via ORAL
  Filled 2013-04-07 (×2): qty 1

## 2013-04-07 MED ORDER — LIDOCAINE HCL (CARDIAC) 20 MG/ML IV SOLN
INTRAVENOUS | Status: AC
  Filled 2013-04-07: qty 5

## 2013-04-07 SURGICAL SUPPLY — 61 items
BARRIER ADHS 3X4 INTERCEED (GAUZE/BANDAGES/DRESSINGS) ×4 IMPLANT
BENZOIN TINCTURE PRP APPL 2/3 (GAUZE/BANDAGES/DRESSINGS) ×4 IMPLANT
CHLORAPREP W/TINT 26ML (MISCELLANEOUS) ×4 IMPLANT
CLOSURE WOUND 1/4X4 (GAUZE/BANDAGES/DRESSINGS) ×1
CLOTH BEACON ORANGE TIMEOUT ST (SAFETY) ×4 IMPLANT
CONT PATH 16OZ SNAP LID 3702 (MISCELLANEOUS) ×4 IMPLANT
COVER MAYO STAND STRL (DRAPES) ×4 IMPLANT
COVER TABLE BACK 60X90 (DRAPES) ×8 IMPLANT
COVER TIP SHEARS 8 DVNC (MISCELLANEOUS) ×2 IMPLANT
COVER TIP SHEARS 8MM DA VINCI (MISCELLANEOUS) ×2
DECANTER SPIKE VIAL GLASS SM (MISCELLANEOUS) ×4 IMPLANT
DERMABOND ADVANCED (GAUZE/BANDAGES/DRESSINGS) ×2
DERMABOND ADVANCED .7 DNX12 (GAUZE/BANDAGES/DRESSINGS) ×2 IMPLANT
DRAPE HUG U DISPOSABLE (DRAPE) ×4 IMPLANT
DRAPE LG THREE QUARTER DISP (DRAPES) ×8 IMPLANT
DRAPE WARM FLUID 44X44 (DRAPE) ×4 IMPLANT
ELECT REM PT RETURN 9FT ADLT (ELECTROSURGICAL) ×4
ELECTRODE REM PT RTRN 9FT ADLT (ELECTROSURGICAL) ×2 IMPLANT
EVACUATOR SMOKE 8.L (FILTER) ×4 IMPLANT
GAUZE VASELINE 3X9 (GAUZE/BANDAGES/DRESSINGS) IMPLANT
GLOVE BIOGEL PI IND STRL 7.0 (GLOVE) ×4 IMPLANT
GLOVE BIOGEL PI INDICATOR 7.0 (GLOVE) ×4
GLOVE ECLIPSE 6.5 STRL STRAW (GLOVE) ×16 IMPLANT
GOWN STRL REIN XL XLG (GOWN DISPOSABLE) ×40 IMPLANT
KIT ACCESSORY DA VINCI DISP (KITS) ×2
KIT ACCESSORY DVNC DISP (KITS) ×2 IMPLANT
LEGGING LITHOTOMY PAIR STRL (DRAPES) ×4 IMPLANT
NEEDLE INSUFFLATION 120MM (ENDOMECHANICALS) ×4 IMPLANT
NEEDLE INSUFFLATION 150MM (ENDOMECHANICALS) ×4 IMPLANT
OCCLUDER COLPOPNEUMO (BALLOONS) ×4 IMPLANT
PACK LAVH (CUSTOM PROCEDURE TRAY) ×4 IMPLANT
PAD PREP 24X48 CUFFED NSTRL (MISCELLANEOUS) ×8 IMPLANT
PROTECTOR NERVE ULNAR (MISCELLANEOUS) ×8 IMPLANT
RUMI II 3.0CM BLUE KOH-EFFICIE (DISPOSABLE) ×4 IMPLANT
SCISSORS LAP 5X35 DISP (ENDOMECHANICALS) ×4 IMPLANT
SET CYSTO W/LG BORE CLAMP LF (SET/KITS/TRAYS/PACK) ×4 IMPLANT
SET IRRIG TUBING LAPAROSCOPIC (IRRIGATION / IRRIGATOR) ×4 IMPLANT
SOLUTION ELECTROLUBE (MISCELLANEOUS) ×4 IMPLANT
STRIP CLOSURE SKIN 1/4X4 (GAUZE/BANDAGES/DRESSINGS) ×3 IMPLANT
SUT VIC AB 0 CT1 27 (SUTURE) ×10
SUT VIC AB 0 CT1 27XBRD ANBCTR (SUTURE) ×10 IMPLANT
SUT VICRYL 0 UR6 27IN ABS (SUTURE) ×4 IMPLANT
SUT VICRYL RAPIDE 4/0 PS 2 (SUTURE) ×8 IMPLANT
SUT VLOC 180 2-0 9IN GS21 (SUTURE) ×4 IMPLANT
SYR 50ML LL SCALE MARK (SYRINGE) ×4 IMPLANT
TIP RUMI ORANGE 6.7MMX12CM (TIP) IMPLANT
TIP UTERINE 5.1X6CM LAV DISP (MISCELLANEOUS) ×4 IMPLANT
TIP UTERINE 6.7X10CM GRN DISP (MISCELLANEOUS) IMPLANT
TIP UTERINE 6.7X6CM WHT DISP (MISCELLANEOUS) ×4 IMPLANT
TIP UTERINE 6.7X8CM BLUE DISP (MISCELLANEOUS) ×4 IMPLANT
TOWEL OR 17X24 6PK STRL BLUE (TOWEL DISPOSABLE) ×8 IMPLANT
TRAY FOLEY BAG SILVER LF 14FR (CATHETERS) ×4 IMPLANT
TROCAR 12M 150ML BLUNT (TROCAR) ×4 IMPLANT
TROCAR 5M 150ML BLDLS (TROCAR) ×4 IMPLANT
TROCAR DILATING TIP 12MM 150MM (ENDOMECHANICALS) ×4 IMPLANT
TROCAR DISP BLADELESS 8 DVNC (TROCAR) ×2 IMPLANT
TROCAR DISP BLADELESS 8MM (TROCAR) ×2
TROCAR XCEL NON-BLD 5MMX100MML (ENDOMECHANICALS) ×4 IMPLANT
TUBING FILTER THERMOFLATOR (ELECTROSURGICAL) ×4 IMPLANT
WARMER LAPAROSCOPE (MISCELLANEOUS) ×4 IMPLANT
WATER STERILE IRR 1000ML POUR (IV SOLUTION) ×12 IMPLANT

## 2013-04-07 NOTE — Anesthesia Postprocedure Evaluation (Signed)
Anesthesia Post Note  Patient: [name]  Procedure(s) Performed: Procedure(s) (LRB): ROBOTIC ASSISTED TOTAL HYSTERECTOMY WITH  BILATERAL SALPINGECTOMY (Bilateral) CYSTOSCOPY (N/A)  Anesthesia type: GA  Patient location: PACU  Post pain: Pain level controlled  Post assessment: Post-op Vital signs reviewed  Last Vitals:  Filed Vitals:   04/07/13 1300  BP: 161/79  Pulse: 86  Temp:   Resp: 14    Post vital signs: Reviewed  Level of consciousness: sedated  Complications: No apparent anesthesia complications

## 2013-04-07 NOTE — Addendum Note (Signed)
Addendum created 04/07/13 1320 by Talbot Grumbling, CRNA   Modules edited: Anesthesia LDA

## 2013-04-07 NOTE — Progress Notes (Signed)
Pt. Alert ox3 d/c instructions given husband with pt. Pt. D/c via w/c with nt

## 2013-04-07 NOTE — Preoperative (Signed)
Beta Blockers   Reason not to administer Beta Blockers:Not Applicable 

## 2013-04-07 NOTE — Progress Notes (Signed)
Subjective: Patient reports no problems voiding.  Good pain control.  No nausea.  Eating dinner.  Ambulating well.  Desires discharge.  Objective: I have reviewed patient's vital signs, intake and output, medications and labs.  Post op hb 12.2.  General: alert and cooperative Resp: clear to auscultation bilaterally Cardio: regular rate and rhythm, S1, S2 normal, no murmur, click, rub or gallop GI: incision: clean, dry and intact Extremities: extremities normal, atraumatic, no cyanosis or edema Vaginal Bleeding: minimal   Assessment/Plan: S/p robotic TLH, bilateral salpingectomy  Doing well.  D/C home.    LOS: 0 days    [name] [facility] 04/07/2013, 6:41 PM

## 2013-04-07 NOTE — Op Note (Signed)
04/07/2013  12:03 PM  PATIENT:  [name]  38 y.o. female  PRE-OPERATIVE DIAGNOSIS:  menorrhagia, anemia, history of anemia with hemoglobin down to 6.6, morbid obesity, history of open gastric bypass surgery, history of transfusion x 2  POST-OPERATIVE DIAGNOSIS: same, omental adhesions to gastric bypass scar  PROCEDURE:  Procedure(s): ROBOTIC ASSISTED TOTAL HYSTERECTOMY WITH  BILATERAL SALPINGECTOMY CYSTOSCOPY  SURGEON:  Leza Apsey SUZANNE  ASSISTANTS: Josefa Half, MD and Elveria Rising, MD   ANESTHESIA:   general  ESTIMATED BLOOD LOSS: 100cc  BLOOD ADMINISTERED:none   FLUIDS: 1500cc LR  UOP: 250cc clear UOP  SPECIMEN:  Uterus and cervix with bilateral tubes  DISPOSITION OF SPECIMEN:  PATHOLOGY  FINDINGS: omental adhesions from umbilicus superiorly along area where 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  DESCRIPTION OF OPERATION: Patient is taken to the operating room. She is placed in the supine position. She has two running IVs in place. Informed consent was present on the chart. SCDs on her lower extremities and functioning properly. General endotracheal anesthesia was administered by the anesthesia staff without difficulty.  Once adequate anesthesia was confirmed the legs are placed in the low lithotomy position in [place]. The patient was already on a beanbag. Her arms were tucked by the side. The beanbag was inflated to ensure that there would be no movement during the Trendelenburg placement.  Pt was then positioned into Trendelenburg positioning to ensure she wouldn't move during the surgery due to her morbid obesity.  She did not move.  The T[place]enburg was reversed.  Chlor prep was then used to prep the abdomen and Betadine was used to prep the inner thighs, perineum and vagina. Once 3  minutes had past the patient was draped in a normal standard fashion. The legs were lifted to the high lithotomy position. The cervix was visualized by placing a heavy weighted speculum in the posterior aspect of the vagina and using a curved Deaver retractor to the retract anteriorly. The anterior lip of the cervix was grasped with single-tooth tenaculum. Single #0 Vicryl stiches were placed on either side of the cervix and the 3 and 9 o'clock positions. 7.5 cm. Pratt dilators were used to dilate the cervix up to a #21. A RUMI uterine manipulator was obtained. A #8 disposable tip was placed on the RUMI manipulator as well as a 3.0 KOH ring. This was passed through the cervix and the bulb of the disposable tip was inflated with 10 cc of normal saline. There was a good fit of the KOH ring around the cervix. The tenaculum was removed. There is also good manipulation of the uterus. The speculum and retractor were removed as well. A Foley catheter was placed to straight drain. The concentrated urine was noted. Legs were lowered to the low lithotomy position and attention was turned the abdomen.  Ropivacaine mixture (0.5% mixed one-to-one with normal saline) was used anesthetize the skin beneath the umbilicus. A #12 non bladed, optiview port was obtained.  After the skin was nicked and the abdomen elevated, the trochar was passed through the abdominal wall layers and into the pelvis under direct visualization.   Low flow CO2 gas was attached trochar and the pneumoperitoneum was achieved without difficulty. Once 3.5 liters of gas was in the abdomen flow was placed on high.  There were  omental adhesions around to the umbilicus and  superiorly corresponding to prior gastric bypass surgery but this did not seem to be in the way so they were left in placed. The upper abdomen could be surveyed without difficulty. Locations for the 1 and #2 arm ports could also be visualized. The skin was transilluminated and the skin was  anesthetized with ropivacaine mixture. 63mm skin incisions were made about 10 cm lateral to the umbilicus on each side. Then 71mm nondisposable trocar ports were passed directly into the abdomen. Also on the left upper quadrant, a 5 mm skin incision was made after anesthetizing the skin with the ropivacaine mixture. A 5 mm non-bladed trocar port were also passed directly into the abdomen. All trochars were removed.  The table was placed on the floor and the patient was placed in Trendelenburg.  17 degree Trendelenburg was needed for adequate visualization of the pelvic.  Steep Trendelenberg was not needed and therefore not used.  The robot was docked in a normal standard fashion to the right of the table. In the #1 arm was placed endoscopic scissors with monopolar cautery attached and then the #2 arm was placed PK [place] with bipolar cautery attached.   The pelvis was surveyed.  The uterus was bulky but otherwise normal.  There was scarring of the peritoneum where the prior bladder flap was made with prior cesarean sections. Tubes were s/p BTL with a portion of tube missing and left paratubal cysts.  The ovaries were normal.  Attention was turned to the right side.  The ureter was noted. With uterus on stretch the right tube was excised off the ovary and mesosalpinx was dissected to free the tube. Then the right utero-ovarian pedicle was serially clamped cauterized and incised. Right round ligament was serially clamped cauterized and incised. The anterior and posterior peritoneum of the inferior leaf of the broad ligament were opened. The scar from prior cesarean section was dissected sharply until the bladder flap could be created. The bladder was taken down below the level of the KOH ring. This was done very carefully and slowly as there was significant scarring. The right uterine artery skeletonized and then just superior to the KOH ring this vessel was serially clamped, cauterized, and incised.  Attention  was turned the left side.  The ureter was seen.  Then the tube was excised off the ovary using sharp dissection a bipolar cautery.  The mesosalpinx was incised freeing the tube. Then the left uterine ovarian pedicle was serially clamped cauterized and incised. Next the left round ligament was serially clamped cauterized and incised. The anterior posterior peritoneum of the inferiorly for the broad ligament were opened. The anterior peritoneum was carried across to the dissection on the right side. The remainder of the bladder flap was created using sharp dissection.  Again care was taken due to the amount of prior scarring. The bladder was well below the level of the KOH ring. The left uterine artery skeletonized. Then the left uterine artery, above the level of the KOH ring, was serially clamped cauterized and incised. The uterus was devascularized at this point.  The colpotomy was performed a starting in the midline and using monopolar cautery with an open edge of the scissors. This was carried around a circumferential fashion until the vaginal mucosa was completely incised in the specimen was freed.  The specimen was then delivered to the vagina.  A vaginal occlusive device was used to maintain the pneumoperitoneum after specimen was delivered.  Instruments were changed with a needle cut suture  driver placed in arm 1 and a Cobra grasper placed #2. A V. lock suture was passed through the middle port. Starting in the right angle, the cuff was closed incorporating the anterior and posterior vaginal mucosa in each stitch. This was carried across all the way to the left corner and a running fashion. To stitches were brought back towards the midline and the suture was cut flush with the vagina. The needle was brought out the pelvis. The pelvis was irrigated. All pedicles were inspected. No bleeding was noted. In Interceed was placed across vaginal cuff. Ureters were noted deep in the pelvis to be peristalsing.  At  this point the procedure was completed. The instruments were removed. The robot was undocked. The patient was taken out of Trendelenburg positioning. The ports were removed under direct vision shove laparoscope and the pneumoperitoneum was relieved. Several deep breaths were given to the patient's trying to any gas the abdomen and finally the midline port was removed.  The midline port was closed at the fascial level with figure-of-eight suture of #0 Vicryl. The skin was then closed with subcuticular stitches of 3-0 Vicryl. The skin was cleansed Dermabond was applied. Attention was then turned the vagina and the cuff was inspected. No bleeding was noted. The anterior and posterior vaginal mucosa was well incorporated in each stitch.   The Foley catheter was removed.  Cystoscopy was performed.  Ureteral orifices were noted and jets of urine seen on each side.  The dome of the bladder was normal.  No sutures or evidence of injury was noted.  The cystoscopic fluid was drained and foley replace due to concentrated urine.  Legs were positioned back in the supine position. Sponge, lap, needle, initially counts were correct x2. Patient tolerated the procedure very well. She was awakened from anesthesia, extubated and taken to recovery in stable condition.    COUNTS:  YES  PLAN OF CARE: Transfer to PACU

## 2013-04-07 NOTE — Anesthesia Preprocedure Evaluation (Signed)
Anesthesia Evaluation  Patient identified by MRN, date of birth, ID band Patient awake    Reviewed: Allergy & Precautions, H&P , NPO status , Patient's Chart, lab work & pertinent test results  History of Anesthesia Complications (+) PONV  Airway Mallampati: II TM Distance: >3 FB Neck ROM: Full    Dental no notable dental hx.    Pulmonary neg pulmonary ROS,  breath sounds clear to auscultation  Pulmonary exam normal       Cardiovascular hypertension, + Valvular Problems/Murmurs MVP Rhythm:Regular Rate:Normal  LBBB   Neuro/Psych negative neurological ROS  negative psych ROS   GI/Hepatic negative GI ROS, Neg liver ROS,   Endo/Other  Morbid obesity  Renal/GU negative Renal ROS  negative genitourinary   Musculoskeletal negative musculoskeletal ROS (+)   Abdominal   Peds negative pediatric ROS (+)  Hematology negative hematology ROS (+)   Anesthesia Other Findings   Reproductive/Obstetrics negative OB ROS                           Anesthesia Physical Anesthesia Plan  ASA: III  Anesthesia Plan: General   Post-op Pain Management:    Induction: Intravenous  Airway Management Planned: Oral ETT  Additional Equipment:   Intra-op Plan:   Post-operative Plan: Extubation in OR  Informed Consent: I have reviewed the patients History and Physical, chart, labs and discussed the procedure including the risks, benefits and alternatives for the proposed anesthesia with the patient or authorized representative who has indicated his/her understanding and acceptance.   Dental advisory given  Plan Discussed with: CRNA  Anesthesia Plan Comments:         Anesthesia Quick Evaluation

## 2013-04-07 NOTE — Discharge Summary (Signed)
Physician Discharge Summary  Patient ID: [name] MRN: [id number] DOB/AGE: [birth year] 38 y.o.  Admit date: 04/07/2013 Discharge date: 04/07/2013  Admission Diagnoses:  Menorrhagia, anemia, morbid obesity, h/o Roux-en-Y, H/o cesarean section x 2, history of transfusions x 2  Discharge Diagnoses:  Active Problems:   Menorrhagia   Discharged Condition: good  Hospital Course: Patient admitted through same day surgery.  She was taken to OR where robotic assisted TLH/bilateral salpingectomy/cysto were performed.  Surgical findings included omental adhesions from umbilicus superiorly corresponding to roux-en-y surgery from several years ago.  Surgery was uneventful.  EBL 100cc.  Foley catheter was left in place due to concentrated urine.  Patient transferred to PACU where she was stable and then to 3rd floor for the remainder of her hospitalization.  During her post-op recovery, her vitals and stable and she was AF.  In evening of POD#0, she was able to transition to oral pain medications and regular diet.  She was able to ambulate and she had good pain control.  Foley was removed and she was able to void.  Post op hb drawn 6 hours post-op was 12.2, changed from 12.1 pre-operatively.  At this point, patient was requesting discharge which I felt was appropriate.  Consults: None  Significant Diagnostic Studies: labs: post op hb 12.2  Treatments: surgery: robotic TLH/bilateral salpingectomy/cystoscopy  Discharge Exam: Blood pressure 143/70, pulse 103, temperature 98.7 F (37.1 C), temperature source Oral, resp. rate 18, height 5\' 4"  (1.626 m), weight 310 lb (140.615 kg), SpO2 99.00%, not currently breastfeeding. General appearance: alert and cooperative Resp: clear to auscultation bilaterally Cardio: regular rate and rhythm, S1, S2 normal, no murmur, click, rub or gallop GI: soft, non-tender; bowel sounds normal; no masses,  no organomegaly Extremities: extremities normal, atraumatic, no  cyanosis or edema Incision/Wound:clean/dry/intact  Disposition: 01-Home or Self Care   Future Appointments Provider Department Dept Phone   04/14/2013 1:30 PM Lyman Speller, MD [place] [phone]   05/05/2013 2:30 PM Lyman Speller, MD [place] [phone]   05/07/2013 8:00 AM [facility] [phone]   05/07/2013 8:30 AM Wyatt Portela, MD [facility] [phone]       Medication List         FLUoxetine 20 MG capsule  Commonly known as:  PROZAC  Take 20 mg by mouth daily.     multivitamin-prenatal 27-0.8 MG Tabs tablet  Take 1 tablet by mouth daily at 12 noon.     naproxen sodium 220 MG tablet  Commonly known as:  ANAPROX  Take 220 mg by mouth 2 (two) times daily with a meal.     oxyCODONE 5 MG immediate release tablet  Commonly known as:  Oxy IR/ROXICODONE  1-2 every 4-6 hours as needed for pain     VITAMIN B 12 PO  Take 1 drop by mouth daily.           Follow-up Information   Follow up with Nakaya Mishkin, Satira Anis, MD.   Specialty:  Gynecology   Contact information:   [place] [address] [phone]       Follow up with Lyman Speller, MD On 04/14/2013. (1:30pm)    Specialty:  Gynecology   Contact information:   [address] [place] [address] [phone]       Signed: Lyman Speller 04/07/2013, 6:48 PM

## 2013-04-07 NOTE — H&P (Signed)
[name] is an 38 y.o. female here for robotic hysterectomy with probable bilateral salpingectomy due to significant menorrhagia and DUB with hemoglobin down as low as 6.6.  Has been treated with two transfusions, IV iron, and Depo Lupron and hemoglobin is now normal.  Despite her obesity, hematology has recommended proceeding with hysterectomy due to significant anemia.  Preoperative evaluation has included a normal endometrial biopsy last year and ultrasound showing 8 x 6 x 4.5cm uterus.  Procedure reviewed with patient as well as consent.  She understands positioning and possibility of open hysterectomy.  All questions answered.  Pertinent Gynecological History: Menses: heavy, irregular with clotting, frequent Bleeding: dysfunctional uterine bleeding Contraception: tubal ligation DES exposure: denies Blood transfusions: 2 transfusions of one unit each Sexually transmitted diseases: no past history Previous GYN Procedures: none  Last mammogram: n/a Date: n/a Last pap: normal Date: 1/15 OB History: G4, P2   Menstrual History: Menarche age: 23  No LMP recorded. 03/29/13    Past Medical History  Diagnosis Date  . Chronic anemia   . Depression     history of depression- none now  . 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 Heartburn in pregnancy   . Complication of anesthesia     pt states she was given too much anesthesia   . PONV (postoperative nausea and vomiting)   . S/P cesarean section (11/12, rpt, suspect macrosomia) 01/10/2012  . Maternal anemia complicating pregnancy, childbirth, or the puerperium 01/10/2012  . Morbidly obese 01/10/2012  . Anxiety   . Dysmenorrhea   . MTHFR mutation   . Chronic hypertension complicating or reason for care during pregnancy 01/10/2012    patient denies/ unable to remove    Past Surgical History  Procedure Laterality Date  . Gastric bypass  2003    with cholecystectomy, in [place]  . Cardiovascular stress  test  2004    revealed anterior apical ischemia.  . Cesarean section  6/08    Dr. Ronita [name]  . Cesarean section with bilateral tubal ligation  01/09/2012    Procedure: CESAREAN SECTION WITH BILATERAL TUBAL LIGATION;  Surgeon: Rose Bruins, MD;  Location: [facility];  Service: Obstetrics;  Laterality: Bilateral;  Repeat C/S   EDD: 01/11/12  . Cardiac catheterization  11-07-2002    The left anterior descending artery is smooth and normal. There are several moderate sized diagonal branch which are normal. The left circumflex artery is normal. It is a very large vessel. The right coronary artery is large and dominant. EF 65%. Smooth and normal coronary arteries. Normal left ventricular systolic function.   . Tubal ligation      No family history on file.  Social History:  reports that she has never smoked. She has never used smokeless tobacco. She reports that she does not drink alcohol or use illicit drugs.  Allergies:  Allergies  Allergen Reactions  . Adhesive [Tape]     Redness, hives  . Amoxicillin Hives    Childhood allergy. Pt. Can tolerate Cephalosporins.    Prescriptions prior to admission  Medication Sig Dispense Refill  . Cyanocobalamin (VITAMIN B 12 PO) Take 1 drop by mouth daily.      Marland Kitchen FLUoxetine (PROZAC) 20 MG capsule Take 20 mg by mouth daily.      . naproxen sodium (ANAPROX) 220 MG tablet Take 220 mg by mouth 2 (two) times daily with a meal.      . Prenatal Vit-Fe Fumarate-FA (  MULTIVITAMIN-PRENATAL) 27-0.8 MG TABS tablet Take 1 tablet by mouth daily at 12 noon.        Review of Systems  All other systems reviewed and are negative.    Blood pressure 123/72, temperature 97.9 F (36.6 C), temperature source Oral, resp. rate 18, height 5\' 4"  (1.626 m), weight 310 lb (140.615 kg), SpO2 100.00%, not currently breastfeeding. Physical Exam  Constitutional: She is oriented to person, place, and time. She appears well-developed and well-nourished.  Neck: Normal range of  motion. Neck supple.  Cardiovascular: Normal rate and regular rhythm.   Respiratory: Effort normal and breath sounds normal.  GI: Soft. Bowel sounds are normal.  Musculoskeletal: Normal range of motion.  Neurological: She is alert and oriented to person, place, and time.  Skin: Skin is dry.  Psychiatric: She has a normal mood and affect.    Results for orders placed during the hospital encounter of 04/07/13 (from the past 24 hour(s))  PREGNANCY, URINE     Status: None   Collection Time    04/07/13  6:21 AM      Result Value Range   Preg Test, Ur NEGATIVE  NEGATIVE    No results found.  Assessment/Plan: 38 yo G4P2 with severe menorrhagia and significant anemia, that has resolved, here for robotic assisted TLH/bilateral salpingectomy.  H/O left BBB.  H/O morbid obesity.  H/O heterozygous for MTHFR gene deficiency.  Pt here and all questions answered.  Pt very ready to proceed.  [name] [facility] 04/07/2013, 6:47 AM

## 2013-04-07 NOTE — Transfer of Care (Signed)
Immediate Anesthesia Transfer of Care Note  Patient: [name]  Procedure(s) Performed: Procedure(s) with comments: ROBOTIC ASSISTED TOTAL HYSTERECTOMY WITH  BILATERAL SALPINGECTOMY (Bilateral) - extra time for BMI CYSTOSCOPY (N/A)  Patient Location: PACU  Anesthesia Type:General  Level of Consciousness: awake, alert  and oriented  Airway & Oxygen Therapy: Patient Spontanous Breathing and Patient connected to face mask oxygen  Post-op Assessment: Report given to PACU RN  Post vital signs: Reviewed  Complications: No apparent anesthesia complications

## 2013-04-07 NOTE — Discharge Instructions (Signed)
Post Op Hysterectomy Instructions Please read the instructions below. Refer to these instructions for the next few weeks. These instructions provide you with general information on caring for yourself after surgery. Your caregiver may also give you specific instructions. While your treatment has been planned according to the most current medical practices available, unavoidable problems sometimes happen. If you have any problems or questions after you leave, please call your caregiver.  HOME CARE INSTRUCTIONS Healing will take time. You will have discomfort, tenderness, swelling and bruising at the operative site for a couple of weeks. This is normal and will get better as time goes on.   Only take over-the-counter or prescription medicines for pain, discomfort or fever as directed by your caregiver.   Do not take aspirin. It can cause bleeding.   Do not drive when taking pain medication.   Follow your caregivers advice regarding diet, exercise, lifting, driving and general activities.   Resume your usual diet as directed and allowed.   Get plenty of rest and sleep.   Do not douche, use tampons, or have sexual intercourse until your caregiver gives you permission. .   Take your temperature if you feel hot or flushed.   You may shower today when you get home.  No tub bath for one week.    Do not drink alcohol until you are not taking any narcotic pain medications.   Try to have someone home with you for a week or two to help with the household activities.   Be careful over the next two to three weeks with any activities at home that involve lifting, pushing, or pulling.  Listen to your body--if something feels uncomfortable to do, then don't do it.  Make sure you and your family understands everything about your operation and recovery.   Walking up stairs is fine.  Do not sign any legal documents until you feel normal again.   Keep all your follow-up appointments as recommended by  your caregiver.   PLEASE CALL THE OFFICE IF:  There is swelling, redness or increasing pain in the wound area.   Pus is coming from the wound.   You notice a bad smell from the wound or surgical dressing.   You have pain, redness and swelling from the intravenous site.   The wound is breaking open (the edges are not staying together).    You develop pain or bleeding when you urinate.   You develop abnormal vaginal discharge.   You have any type of abnormal reaction or develop an allergy to your medication.   You need stronger pain medication for your pain   SEEK IMMEDIATE MEDICAL CARE:  You develop a temperature of 100.5 or higher.   You develop abdominal pain.   You develop chest pain.   You develop shortness of breath.   You pass out.   You develop pain, swelling or redness of your leg.   You develop heavy vaginal bleeding with or without blood clots.   MEDICATIONS:  Restart your regular medications BUT wait one week before restarting all vitamins and mineral supplements  For pain, use Oxycodone 5mg  1-2 tablets every 4-6 hours as needed for pain.  Limit the anti-inflammatories you use (like Aleve or Motrin, for example, due to your gastric bypass surgery.)  You may use an over the counter stool softener like Colace or Dulcolax to help with starting a bowel movement.  Start the day after you go home.  Warm liquids, fluids, and ambulation help too.  If you have not had a bowel movement in four days, you need to call the office.

## 2013-04-08 ENCOUNTER — Encounter (HOSPITAL_COMMUNITY): Payer: Self-pay | Admitting: Obstetrics & Gynecology

## 2013-04-14 ENCOUNTER — Encounter: Payer: Self-pay | Admitting: Obstetrics & Gynecology

## 2013-04-14 ENCOUNTER — Ambulatory Visit (INDEPENDENT_AMBULATORY_CARE_PROVIDER_SITE_OTHER): Payer: BC Managed Care – PPO | Admitting: Obstetrics & Gynecology

## 2013-04-14 VITALS — BP 124/58 | HR 80 | Resp 16 | Ht 63.75 in | Wt 327.0 lb

## 2013-04-14 DIAGNOSIS — N920 Excessive and frequent menstruation with regular cycle: Secondary | ICD-10-CM

## 2013-04-14 NOTE — Progress Notes (Signed)
Post Operative Visit  Procedure: Robotic Assisted Total Hysterectomy/BSO Days Post-op: 8 days  Subjective: Doing well.  Took four pain pills.  Nothing for 5 days.  Bowel and bladder function is normal.  No bleeding.  Crampy a little today as she spent the morning walking around [employer].  Pathology reviewed with patient--neg.  Objective: BP 124/58  Pulse 80  Resp 16  Ht 5' 3.75" (1.619 m)  Wt 327 lb (148.326 kg)  BMI 56.59 kg/m2  LMP 02/28/2013  Breastfeeding? No  EXAM General: alert and cooperative Resp: clear to auscultation bilaterally Cardio: regular rate and rhythm, S1, S2 normal, no murmur, click, rub or gallop GI: soft, non-tender; bowel sounds normal; no masses,  no organomegaly Extremities: extremities normal, atraumatic, no cyanosis or edema Vaginal Bleeding: none Incision:  C/D/I  Assessment: s/p Robotic TLH/bilateral salpinectomy/cysto  Plan: Recheck 3 weeks

## 2013-04-28 ENCOUNTER — Encounter: Payer: Self-pay | Admitting: Emergency Medicine

## 2013-04-28 ENCOUNTER — Telehealth: Payer: Self-pay | Admitting: Obstetrics & Gynecology

## 2013-04-28 NOTE — Telephone Encounter (Signed)
Patient had surgery 3 weeks ago and is back at work now but needs a note saying it is okay for her to be back at work. Can you please fax it to ATTN: Gerald Stabs [phone]

## 2013-04-28 NOTE — Telephone Encounter (Signed)
Spoke with patient. She is back at work today. 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 Advised that I have given request to Dr. Sabra Heck and will fax when ready. Patient agreeable.

## 2013-04-28 NOTE — Telephone Encounter (Signed)
Dr. Sabra Heck, okay to write letter to return to work today and fax?

## 2013-04-28 NOTE — Telephone Encounter (Signed)
Patient is calling to check on note.

## 2013-04-28 NOTE — Telephone Encounter (Signed)
This is fine.  Please send note that it is ok to return to work.

## 2013-04-29 NOTE — Telephone Encounter (Signed)
Letter faxed. Confirmation received.  Encounter closed.

## 2013-05-01 NOTE — Telephone Encounter (Signed)
Patient stopped by into office requesting a copy of the letter. She states that her office had not received a copy. I offered apologies, advised that fax was confirmed that it went through and gave her a copy of signed letter by Dr. Sabra Heck.

## 2013-05-05 ENCOUNTER — Ambulatory Visit: Payer: BC Managed Care – PPO | Admitting: Obstetrics & Gynecology

## 2013-05-07 ENCOUNTER — Encounter: Payer: Self-pay | Admitting: Oncology

## 2013-05-07 ENCOUNTER — Other Ambulatory Visit (HOSPITAL_BASED_OUTPATIENT_CLINIC_OR_DEPARTMENT_OTHER): Payer: BC Managed Care – PPO

## 2013-05-07 ENCOUNTER — Ambulatory Visit (HOSPITAL_BASED_OUTPATIENT_CLINIC_OR_DEPARTMENT_OTHER): Payer: BC Managed Care – PPO | Admitting: Oncology

## 2013-05-07 ENCOUNTER — Telehealth: Payer: Self-pay | Admitting: Obstetrics & Gynecology

## 2013-05-07 ENCOUNTER — Ambulatory Visit: Payer: BC Managed Care – PPO | Admitting: Obstetrics & Gynecology

## 2013-05-07 ENCOUNTER — Telehealth: Payer: Self-pay | Admitting: Oncology

## 2013-05-07 VITALS — BP 135/83 | HR 79 | Temp 98.2°F | Resp 18 | Ht 63.0 in | Wt 334.3 lb

## 2013-05-07 DIAGNOSIS — D500 Iron deficiency anemia secondary to blood loss (chronic): Secondary | ICD-10-CM

## 2013-05-07 DIAGNOSIS — N920 Excessive and frequent menstruation with regular cycle: Secondary | ICD-10-CM

## 2013-05-07 DIAGNOSIS — Z9884 Bariatric surgery status: Secondary | ICD-10-CM

## 2013-05-07 DIAGNOSIS — D649 Anemia, unspecified: Secondary | ICD-10-CM

## 2013-05-07 DIAGNOSIS — D509 Iron deficiency anemia, unspecified: Secondary | ICD-10-CM

## 2013-05-07 LAB — CBC WITH DIFFERENTIAL/PLATELET
BASO%: 0.8 % (ref 0.0–2.0)
Basophils Absolute: 0.1 10*3/uL (ref 0.0–0.1)
EOS ABS: 0.5 10*3/uL (ref 0.0–0.5)
EOS%: 7.4 % — AB (ref 0.0–7.0)
HEMATOCRIT: 38.9 % (ref 34.8–46.6)
HEMOGLOBIN: 12.9 g/dL (ref 11.6–15.9)
LYMPH%: 25.1 % (ref 14.0–49.7)
MCH: 27.6 pg (ref 25.1–34.0)
MCHC: 33.2 g/dL (ref 31.5–36.0)
MCV: 83 fL (ref 79.5–101.0)
MONO#: 0.6 10*3/uL (ref 0.1–0.9)
MONO%: 9 % (ref 0.0–14.0)
NEUT%: 57.7 % (ref 38.4–76.8)
NEUTROS ABS: 3.7 10*3/uL (ref 1.5–6.5)
Platelets: 247 10*3/uL (ref 145–400)
RBC: 4.68 10*6/uL (ref 3.70–5.45)
RDW: 19 % — ABNORMAL HIGH (ref 11.2–14.5)
WBC: 6.4 10*3/uL (ref 3.9–10.3)
lymph#: 1.6 10*3/uL (ref 0.9–3.3)

## 2013-05-07 LAB — IRON AND TIBC CHCC
%SAT: 25 % (ref 21–57)
Iron: 88 ug/dL (ref 41–142)
TIBC: 354 ug/dL (ref 236–444)
UIBC: 266 ug/dL (ref 120–384)

## 2013-05-07 LAB — FERRITIN CHCC: FERRITIN: 23 ng/mL (ref 9–269)

## 2013-05-07 NOTE — Telephone Encounter (Signed)
Patient [name]  her post op appointment today with Dr. Sabra Heck. I sent a message to Gay Filler to help reschedule her appointment. I told patient someone would call her soon to reschedule.

## 2013-05-07 NOTE — Telephone Encounter (Signed)
gv and printed appt scehd adn avs for pt for July

## 2013-05-07 NOTE — Telephone Encounter (Signed)
Call back to patient, offered work-in appointment at 245 with Dr Sabra Heck. Patient appreciative.  Routing to provider for final review. Patient agreeable to disposition. Will close encounter

## 2013-05-07 NOTE — Progress Notes (Signed)
Hematology and Oncology Follow Up Visit  [name] [id number] 02-23-1976 39 y.o. 05/07/2013 8:47 AM  Principle Diagnosis: 39 year old woman with iron deficiency anemia likely due to menorrhagia and gastric bypass surgery in the past first evaluated in November of 2014.   Prior Therapy: She is status post Feraheme IV infusion on 01/10/2013 that was repeated on 03/07/2013.  Current therapy: Currently on oral iron supplements as a part of multivitamin.  Interim History: This is a pleasant 38 year old woman seen in followup after initial consultation in November of 2014. She presented with profound anemia with hemoglobin of 6.6 and MCV 53 and low ferritin of less than 4. She received IV iron infusion on 2 separate occasions without any complications and have tolerated it well. Since her last visit, she underwent hysterectomy in February of 2015 without any complications. She tolerated it well and did not have any bleeding or thrombosis complication afterwards. She noticed some improvement in her overall energy. She has not reported any chest pain shortness of breath or difficulty breathing. She still is fatigued but not as bad as previously. She is not reporting any GI bleeding or GU bleeding at this time. She is no longer reporting any bleeding complications at this time.  Medications: I have reviewed the patient's current medications.  Current Outpatient Prescriptions  Medication Sig Dispense Refill  . Cyanocobalamin (VITAMIN B 12 PO) Take 1 drop by mouth daily.      Marland Kitchen FLUoxetine (PROZAC) 20 MG capsule Take 20 mg by mouth daily.      . naproxen sodium (ANAPROX) 220 MG tablet Take 220 mg by mouth 2 (two) times daily with a meal.      . Prenatal Vit-Fe Fumarate-FA (MULTIVITAMIN-PRENATAL) 27-0.8 MG TABS tablet Take 1 tablet by mouth daily at 12 noon.       No current facility-administered medications for this visit.     Allergies:  Allergies  Allergen Reactions  . Adhesive [Tape]    Redness, hives  . Amoxicillin Hives    Childhood allergy. Pt. Can tolerate Cephalosporins.    Past Medical History, Surgical history, Social history, and Family History were reviewed and updated.  Review of Systems: Constitutional:  Negative for fever, chills, night sweats, anorexia, weight loss, pain.  Remaining ROS negative. Physical Exam: Blood pressure 135/83, pulse 79, temperature 98.2 F (36.8 C), temperature source Oral, resp. rate 18, height 5\' 3"  (1.6 m), weight 334 lb 4.8 oz (151.637 kg), last menstrual period 02/28/2013, SpO2 100.00%, not currently breastfeeding. ECOG: 0 General appearance: alert, cooperative and appears stated age Head: Normocephalic, without obvious abnormality, atraumatic Neck: no adenopathy, no carotid bruit, no JVD, supple, symmetrical, trachea midline and thyroid not enlarged, symmetric, no tenderness/mass/nodules Lymph nodes: Cervical, supraclavicular, and axillary nodes normal. Heart:regular rate and rhythm, S1, S2 normal, no murmur, click, rub or gallop Lung:chest clear, no wheezing, rales, normal symmetric air entry Abdomin: soft, non-tender, without masses or organomegaly EXT:no erythema, induration, or nodules   Lab Results: Lab Results  Component Value Date   WBC 6.4 05/07/2013   HGB 12.9 05/07/2013   HCT 38.9 05/07/2013   MCV 83.0 05/07/2013   PLT 247 05/07/2013     Chemistry      Component Value Date/Time   NA 140 04/01/2013 1420   NA 142 01/09/2013 1024   K 4.2 04/01/2013 1420   K 3.9 01/09/2013 1024   CL 103 04/01/2013 1420   CO2 26 04/01/2013 1420   CO2 23 01/09/2013 1024   BUN 12 04/01/2013 1420  BUN 19.7 01/09/2013 1024   CREATININE 0.61 04/01/2013 1420   CREATININE 0.58 03/14/2013 0908   CREATININE 0.8 01/09/2013 1024      Component Value Date/Time   CALCIUM 8.9 04/01/2013 1420   CALCIUM 9.3 01/09/2013 1024   ALKPHOS 66 03/14/2013 0908   ALKPHOS 67 01/09/2013 1024   AST 17 03/14/2013 0908   AST 22 01/09/2013 1024   ALT 17 03/14/2013  0908   ALT 18 01/09/2013 1024   BILITOT 0.2* 03/14/2013 0908   BILITOT 0.28 01/09/2013 1024       Impression and Plan:  38 year old woman with the following issues:  1. Iron deficiency anemia: This is related to menorrhagia and her inability to absorb oral iron compounded by possibly gastric bypass surgery. Her hemoglobin is excellent today and does not require any IV iron at this time but we'll continue to monitor her closely given her history of gastric bypass and her inability to absorb oral iron. I will check her back again in 4 months.  2. Menorrhagia: She is status post hysterectomy which seems to have resolved at least as part of her problem.  [facility], MD 3/11/20158:47 AM

## 2013-05-07 NOTE — Telephone Encounter (Addendum)
Patient did not show for her post op appointment today. Patients original appointment was rescheduled due to Dr. Ammie Ferrier jury duty. Patient was confused and thought her appointment was tomorrow. I could not find any appointments with Dr. Sabra Heck the rest of this week. I found 05/15/13 @3 :51 nurse ad on. Is this ok?  The patient is anxious to be seen.

## 2013-05-08 ENCOUNTER — Ambulatory Visit (INDEPENDENT_AMBULATORY_CARE_PROVIDER_SITE_OTHER): Payer: BC Managed Care – PPO | Admitting: Obstetrics & Gynecology

## 2013-05-08 ENCOUNTER — Encounter: Payer: Self-pay | Admitting: Obstetrics & Gynecology

## 2013-05-08 VITALS — BP 112/68 | HR 60 | Resp 16 | Ht 63.75 in | Wt 334.0 lb

## 2013-05-08 DIAGNOSIS — Z9889 Other specified postprocedural states: Secondary | ICD-10-CM

## 2013-05-08 MED ORDER — FLUOXETINE HCL 40 MG PO CAPS: 40.0000 mg | ORAL_CAPSULE | Freq: Every day | ORAL | Status: DC

## 2013-05-08 MED ORDER — FLUOXETINE HCL 20 MG PO CAPS: 20.0000 mg | ORAL_CAPSULE | Freq: Every day | ORAL | Status: DC

## 2013-05-08 NOTE — Progress Notes (Signed)
Post Operative Visit  Procedure: Robotic Total Hysterectomy/bilateral salpingectomy Days Post-op: 32  Subjective: Doing well except for some moodiness.  Feels like she is going to cry at anytime and this is different.  Also feeling like she don't when she need to void, until the bladder is really full.  She also states that she feels like her bladder is "sensitive"--certain movements will give her urgency sensation.    No pain.  Voiding well.  No constipation.  No VB.  Objective: BP 112/68  Pulse 60  Resp 16  Ht 5' 3.75" (1.619 m)  Wt 334 lb (151.501 kg)  BMI 57.80 kg/m2  LMP 02/28/2013  EXAM General: alert and cooperative Resp: clear to auscultation bilaterally Cardio: regular rate and rhythm, S1, S2 normal, no murmur, click, rub or gallop GI: soft, non-tender; bowel sounds normal; no masses,  no organomegaly and incision: clean and dry Extremities: extremities normal, atraumatic, no cyanosis or edema Vaginal Bleeding: none Cuff intact and healing well.  Sutures intact.  No fullness or tenderness.  Assessment: s/p TLH/bilateral salpingectomy  Plan: Recheck 3 months Increase prozac to 40mg  daily.  rx to pharamcy.

## 2013-06-06 ENCOUNTER — Other Ambulatory Visit: Payer: Self-pay | Admitting: *Deleted

## 2013-06-06 MED ORDER — FLUOXETINE HCL 40 MG PO CAPS: 40.0000 mg | ORAL_CAPSULE | Freq: Every day | ORAL | Status: DC

## 2013-06-06 NOTE — Telephone Encounter (Signed)
Incoming fax requesting 90 day supply on Fluoxetine.  Rx prescribed 05/08/13 #30/3 refills  Will send 90 day supply to pharmacy with no refills.

## 2013-08-11 ENCOUNTER — Ambulatory Visit: Payer: BC Managed Care – PPO | Admitting: Obstetrics & Gynecology

## 2013-08-11 ENCOUNTER — Telehealth: Payer: Self-pay

## 2013-08-11 NOTE — Telephone Encounter (Signed)
Patient's appointment has been r/s to 08/18/13//kn

## 2013-08-11 NOTE — Telephone Encounter (Signed)
Patient is calling [name] back °

## 2013-08-11 NOTE — Telephone Encounter (Signed)
lmtcb needs to reschedule appointment for today//kn

## 2013-08-18 ENCOUNTER — Encounter: Payer: Self-pay | Admitting: Obstetrics & Gynecology

## 2013-08-18 ENCOUNTER — Ambulatory Visit (INDEPENDENT_AMBULATORY_CARE_PROVIDER_SITE_OTHER): Payer: BC Managed Care – PPO | Admitting: Obstetrics & Gynecology

## 2013-08-18 VITALS — BP 104/64 | HR 64 | Resp 16 | Ht 63.75 in | Wt 358.8 lb

## 2013-08-18 DIAGNOSIS — D509 Iron deficiency anemia, unspecified: Secondary | ICD-10-CM

## 2013-08-18 DIAGNOSIS — F32A Depression, unspecified: Secondary | ICD-10-CM | POA: Insufficient documentation

## 2013-08-18 DIAGNOSIS — R5381 Other malaise: Secondary | ICD-10-CM

## 2013-08-18 DIAGNOSIS — F3289 Other specified depressive episodes: Secondary | ICD-10-CM

## 2013-08-18 DIAGNOSIS — F329 Major depressive disorder, single episode, unspecified: Secondary | ICD-10-CM

## 2013-08-18 DIAGNOSIS — R5383 Other fatigue: Secondary | ICD-10-CM

## 2013-08-18 LAB — IBC PANEL
%SAT: 24 % (ref 20–55)
TIBC: 388 ug/dL (ref 250–470)
UIBC: 294 ug/dL (ref 125–400)

## 2013-08-18 LAB — CBC WITH DIFFERENTIAL/PLATELET
BASOS ABS: 0 10*3/uL (ref 0.0–0.1)
BASOS PCT: 0 % (ref 0–1)
EOS ABS: 0.2 10*3/uL (ref 0.0–0.7)
EOS PCT: 4 % (ref 0–5)
HCT: 39.7 % (ref 36.0–46.0)
Hemoglobin: 13.4 g/dL (ref 12.0–15.0)
Lymphocytes Relative: 30 % (ref 12–46)
Lymphs Abs: 1.5 10*3/uL (ref 0.7–4.0)
MCH: 28.9 pg (ref 26.0–34.0)
MCHC: 33.8 g/dL (ref 30.0–36.0)
MCV: 85.6 fL (ref 78.0–100.0)
MONOS PCT: 8 % (ref 3–12)
Monocytes Absolute: 0.4 10*3/uL (ref 0.1–1.0)
NEUTROS ABS: 3 10*3/uL (ref 1.7–7.7)
Neutrophils Relative %: 58 % (ref 43–77)
Platelets: 297 10*3/uL (ref 150–400)
RBC: 4.64 MIL/uL (ref 3.87–5.11)
RDW: 14.2 % (ref 11.5–15.5)
WBC: 5.1 10*3/uL (ref 4.0–10.5)

## 2013-08-18 LAB — IRON: Iron: 94 ug/dL (ref 42–145)

## 2013-08-18 LAB — THYROID PANEL WITH TSH
Free Thyroxine Index: 2.1 (ref 1.0–3.9)
T3 Uptake: 42 % — ABNORMAL HIGH (ref 22.5–37.0)
T4, Total: 5.1 ug/dL (ref 5.0–12.5)
TSH: 2.14 u[IU]/mL (ref 0.350–4.500)

## 2013-08-18 LAB — FERRITIN: FERRITIN: 11 ng/mL (ref 10–291)

## 2013-08-18 MED ORDER — FLUOXETINE HCL 40 MG PO CAPS: 40.0000 mg | ORAL_CAPSULE | Freq: Every day | ORAL | Status: DC

## 2013-08-18 NOTE — Progress Notes (Deleted)
Post Operative Visit  Procedure: Robotic Assisted Total Hysterectomy with bilateral salpingectomy Days Post-op: 133 days  Subjective: ***  Objective: BP 104/64  Pulse 64  Resp 16  Ht 5' 3.75" (1.619 m)  Wt 358 lb 12.8 oz (162.751 kg)  BMI 62.09 kg/m2  LMP 02/28/2013  EXAM General: {Exam; general:16600} Resp: {Exam; lung:16931} Cardio: {Exam; heart:5510} GI: {Exam, HW:2993716} Extremities: {Exam; extremity:5109} Vaginal Bleeding: {exam; vaginal bleeding:3041122}  Assessment: s/p ***  Plan: Recheck {NUMBER 1-10:22536} weeks ***

## 2013-08-18 NOTE — Progress Notes (Signed)
Subjective:     Patient ID: [name], female   DOB: April 12, 1975, 38 y.o.   MRN: [id number]  HPI 38 yo [id number] [id number] here for follow up after increasing Prozac.  Doing well from mood standpoint but having a lot of fatigue which was better and now feels like it's worsening.  Has appt with Dr. Alen Blew in July.  Labs scheduled then.  Pt wants to do earlier if possible.  Wonders about fatigue.  Weight is up as well.  Having a lot of stressors at home.  Caring for parents.  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  Children are in day care and summer programs.    Father is in residential care due to Alzheimer's.    Review of Systems     Objective:   Physical Exam  Constitutional: She is oriented to person, place, and time. She appears well-developed and well-nourished.  Neurological: She is alert and oriented to person, place, and time.  Skin: Skin is warm and dry.  Psychiatric: She has a normal mood and affect.       Assessment:     Depression Fatigue     Plan:     Prozac 40mg  daily.  #90/4RF CBC with diff, iron and ferritin levels TSH with panel      ~15 minutes spent with patient >50% of time was in face to face discussion of above.

## 2013-08-21 ENCOUNTER — Telehealth: Payer: Self-pay

## 2013-08-21 NOTE — Telephone Encounter (Signed)
Message copied by Robley Fries on Thu Aug 21, 2013  9:31 AM ------      Message from: Megan Salon      Created: Wed Aug 20, 2013 10:22 AM       Please inform pt her iron levels are fine but ferritin is 11.  I have sent a message to her hematologist--Dr. Alen Blew to see if he wants to see her earlier than her July appt.  CBC is normal.  Thyroid panel was fine. ------

## 2013-08-21 NOTE — Telephone Encounter (Signed)
Lmtcb//kn 

## 2013-08-22 NOTE — Telephone Encounter (Signed)
Patient notified of results.//kn 

## 2013-09-02 ENCOUNTER — Telehealth: Payer: Self-pay | Admitting: Obstetrics & Gynecology

## 2013-09-02 NOTE — Telephone Encounter (Signed)
[name] with [employer] calling regarding a referral for pt to an endocrinologist. Pt wanting to lose weight and they are helping her with goals and weight loss. [phone].

## 2013-09-02 NOTE — Telephone Encounter (Signed)
Dr. Sabra Heck,  Okay to enter referral if patient agreeable?  This company and representative not on designated party release form.

## 2013-09-03 NOTE — Telephone Encounter (Signed)
Message left to return call to [facility] at [phone] to Patient to obtain further information.

## 2013-09-03 NOTE — Telephone Encounter (Signed)
[name] is calling again regarding the referral

## 2013-09-04 NOTE — Telephone Encounter (Signed)
Spoke with patient. She states that she would like me to speak with the representative from [facility].  This is a representative from her insurance company and she is working with them to help with weight loss and fatigue. Patient is requesting a referral to [facility] as they are in her insurance network and requests female provider. The only female [place] endocrinologist I can see is Dr. Cruzita Lederer. Okay to enter referral?   Patient requested that I speak with Ronney Lion at [facility]. She states they are contracted with [employer] and they work directly with them for health maintenance. She states that she saw patient and patient has complaints r/t hair loss, bald patches on head due to hair loss and fatigue and that patient would like referral to endocrinology.  Advised would send message to Dr. Sabra Heck for review.

## 2013-09-04 NOTE — Telephone Encounter (Signed)
Returning a call to Tracy °

## 2013-09-04 NOTE — Telephone Encounter (Signed)
1. Left a voicemail for [name] at [facility] re: we will need a signed release from the patient authorizing [place] to release records to her company. Our signed consent on file only gives authorization to communicate with the insurance company in order to process the visit claim. Since this is not relating to a specific date of service, we will need a release signed by the patient per Betha Loa at [facility].  2. Left a voicemail for the patient re: need a signed release to give information to [facility].

## 2013-09-08 NOTE — Telephone Encounter (Signed)
Patient called back i advised patient of starlas note and that the release was on our web site she said she would print it off and fax it to [place].

## 2013-09-09 ENCOUNTER — Ambulatory Visit (HOSPITAL_BASED_OUTPATIENT_CLINIC_OR_DEPARTMENT_OTHER): Payer: BC Managed Care – PPO | Admitting: Oncology

## 2013-09-09 ENCOUNTER — Other Ambulatory Visit (HOSPITAL_BASED_OUTPATIENT_CLINIC_OR_DEPARTMENT_OTHER): Payer: BC Managed Care – PPO

## 2013-09-09 ENCOUNTER — Encounter: Payer: Self-pay | Admitting: Oncology

## 2013-09-09 VITALS — BP 146/84 | HR 71 | Temp 97.4°F | Resp 19 | Ht 63.75 in | Wt 363.9 lb

## 2013-09-09 DIAGNOSIS — D500 Iron deficiency anemia secondary to blood loss (chronic): Secondary | ICD-10-CM

## 2013-09-09 DIAGNOSIS — N920 Excessive and frequent menstruation with regular cycle: Secondary | ICD-10-CM

## 2013-09-09 DIAGNOSIS — D649 Anemia, unspecified: Secondary | ICD-10-CM

## 2013-09-09 DIAGNOSIS — D509 Iron deficiency anemia, unspecified: Secondary | ICD-10-CM

## 2013-09-09 LAB — FERRITIN CHCC: FERRITIN: 14 ng/mL (ref 9–269)

## 2013-09-09 LAB — CBC WITH DIFFERENTIAL/PLATELET
BASO%: 0.3 % (ref 0.0–2.0)
Basophils Absolute: 0 10*3/uL (ref 0.0–0.1)
EOS ABS: 0.2 10*3/uL (ref 0.0–0.5)
EOS%: 3.4 % (ref 0.0–7.0)
HEMATOCRIT: 38.5 % (ref 34.8–46.6)
HEMOGLOBIN: 12.6 g/dL (ref 11.6–15.9)
LYMPH%: 24.8 % (ref 14.0–49.7)
MCH: 28.3 pg (ref 25.1–34.0)
MCHC: 32.7 g/dL (ref 31.5–36.0)
MCV: 86.3 fL (ref 79.5–101.0)
MONO#: 0.4 10*3/uL (ref 0.1–0.9)
MONO%: 6.9 % (ref 0.0–14.0)
NEUT%: 64.6 % (ref 38.4–76.8)
NEUTROS ABS: 3.7 10*3/uL (ref 1.5–6.5)
Platelets: 237 10*3/uL (ref 145–400)
RBC: 4.46 10*6/uL (ref 3.70–5.45)
RDW: 13.6 % (ref 11.2–14.5)
WBC: 5.8 10*3/uL (ref 3.9–10.3)
lymph#: 1.4 10*3/uL (ref 0.9–3.3)

## 2013-09-09 LAB — IRON AND TIBC CHCC
%SAT: 30 % (ref 21–57)
Iron: 112 ug/dL (ref 41–142)
TIBC: 371 ug/dL (ref 236–444)
UIBC: 259 ug/dL (ref 120–384)

## 2013-09-09 NOTE — Progress Notes (Signed)
Hematology and Oncology Follow Up Visit  [name] [id number] 30-Apr-1975 38 y.o. 09/09/2013 9:25 AM  Principle Diagnosis: 38 year old woman with iron deficiency anemia likely due to menorrhagia and gastric bypass surgery in the past first evaluated in November of 2014.   Prior Therapy: She is status post Feraheme IV infusion on 01/10/2013 that was repeated on 03/07/2013.  Current therapy: Currently on oral iron supplements as a part of multivitamin.  Interim History: [name] presents today for a followup visit. Since her last visit, she continues to do well without any major issues. She underwent hysterectomy in February of 2015 without any complications. She tolerated it well and did not have any bleeding or thrombosis complication afterwards. She noticed improvement in her overall energy. She has not reported any chest pain shortness of breath or difficulty breathing. She still is fatigued but not as bad as previously. She is not reporting any GI bleeding or GU bleeding at this time. She is no longer reporting any bleeding complications at this time. Her energy and performance status continued to be at baseline. She does not report any headaches or blurry vision. Did not report any syncope or seizures. She does not report any nausea or vomiting or change in her bowel habits. She does not report any lymphadenopathy or skin rashes. She does not report any petechiae or bleeding. Rest of her review of systems unremarkable.  Medications: I have reviewed the patient's current medications.  Current Outpatient Prescriptions  Medication Sig Dispense Refill  . Cyanocobalamin (VITAMIN B 12 PO) Take 1 drop by mouth daily.      Marland Kitchen FLUoxetine (PROZAC) 40 MG capsule Take 1 capsule (40 mg total) by mouth daily.  90 capsule  4  . Prenatal Vit-Fe Fumarate-FA (MULTIVITAMIN-PRENATAL) 27-0.8 MG TABS tablet Take 1 tablet by mouth daily at 12 noon.       No current facility-administered medications for this  visit.     Allergies:  Allergies  Allergen Reactions  . Adhesive [Tape]     Redness, hives  . Amoxicillin Hives    Childhood allergy. Pt. Can tolerate Cephalosporins.    Past Medical History, Surgical history, Social history, and Family History were reviewed and updated.   Physical Exam: Blood pressure 146/84, pulse 71, temperature 97.4 F (36.3 C), temperature source Oral, resp. rate 19, height 5' 3.75" (1.619 m), weight 363 lb 14.4 oz (165.064 kg), last menstrual period 02/28/2013, SpO2 100.00%, not currently breastfeeding. ECOG: 0 General appearance: alert, cooperative and appears stated age Head: Normocephalic, without obvious abnormality, atraumatic Neck: no adenopathy Lymph nodes: Cervical, supraclavicular, and axillary nodes normal. Heart:regular rate and rhythm, S1, S2 normal, no murmur, click, rub or gallop Lung:chest clear, no wheezing, rales, normal symmetric air entry Abdomin: soft, non-tender, without masses or organomegaly EXT:no erythema, induration, or nodules   Lab Results: Lab Results  Component Value Date   WBC 5.8 09/09/2013   HGB 12.6 09/09/2013   HCT 38.5 09/09/2013   MCV 86.3 09/09/2013   PLT 237 09/09/2013     Chemistry      Component Value Date/Time   NA 140 04/01/2013 1420   NA 142 01/09/2013 1024   K 4.2 04/01/2013 1420   K 3.9 01/09/2013 1024   CL 103 04/01/2013 1420   CO2 26 04/01/2013 1420   CO2 23 01/09/2013 1024   BUN 12 04/01/2013 1420   BUN 19.7 01/09/2013 1024   CREATININE 0.61 04/01/2013 1420   CREATININE 0.58 03/14/2013 0908   CREATININE 0.8 01/09/2013 1024  Component Value Date/Time   CALCIUM 8.9 04/01/2013 1420   CALCIUM 9.3 01/09/2013 1024   ALKPHOS 66 03/14/2013 0908   ALKPHOS 67 01/09/2013 1024   AST 17 03/14/2013 0908   AST 22 01/09/2013 1024   ALT 17 03/14/2013 0908   ALT 18 01/09/2013 1024   BILITOT 0.2* 03/14/2013 0908   BILITOT 0.28 01/09/2013 1024       Impression and Plan:  38 year old woman with the following  issues:  1. Iron deficiency anemia: This is related to menorrhagia and her inability to absorb oral iron compounded by possibly gastric bypass surgery. Her hemoglobin is 12.6 today and does not require any IV iron at this time but we'll continue to monitor her closely given her history of gastric bypass and her inability to absorb oral iron. I will check her back again in 4 months. I explained to her that she might require IV iron in the future but not as frequent given her hysterectomy.  2. Menorrhagia: She is status post hysterectomy which have resolved this issue.  [facility], MD 7/14/20159:25 AM

## 2013-09-09 NOTE — Telephone Encounter (Signed)
Release received from patient authorizing release of records to [facility]. Records faxed as requested. Routing to Dr. Sabra Heck for referral per Olivia Mackie.

## 2013-09-11 ENCOUNTER — Telehealth: Payer: Self-pay | Admitting: Oncology

## 2013-09-11 NOTE — Telephone Encounter (Signed)
lmonvm advising the pt of her nov appts.

## 2013-09-12 NOTE — Telephone Encounter (Signed)
Can you call pt and tell her I don't have a reason to refer her to an endocrinology specialist as her thyroid was normal.  I want to help her but she doesn't have thyroid dysfunction.  If fatigue is the big issue, since her thyroid is normal, she needs to see a PCP.

## 2013-09-15 NOTE — Telephone Encounter (Signed)
Call to patietn with message from Dr Sabra Heck, [facility]. Ask for triage nurse.

## 2013-09-15 NOTE — Telephone Encounter (Signed)
Patient returning call, Advised of Dr Ammie Ferrier instruction that with normal thyroid function testing and complaints of fatigue, this does not indicate a need to refer to endocrinology. And we recommend she see PCP for this.  Patient insistent that she has other issues, depression and had hysterectomy and that she feels there are additional thyroid test that need to be done. Also states PCP wont do anything. She talked to someone at [facility], possible case manager, who recommended referral to endocrinology. Advised again that Dr Sabra Heck does not feel this meets criteria for referral, OV notes have to be sent to endocrionology for review before they will schedule.  Advised she can come in to discuss her other symptoms/issues with Dr Sabra Heck to see if additional testing needed but with info we have, unable to make referral, can come here or PCP. Patient states she should not have to come back in and spend more money. Patient disconnected call.    Encounter closed.

## 2013-10-27 ENCOUNTER — Encounter: Payer: Self-pay | Admitting: Family Medicine

## 2013-10-27 ENCOUNTER — Ambulatory Visit (INDEPENDENT_AMBULATORY_CARE_PROVIDER_SITE_OTHER): Payer: BC Managed Care – PPO | Admitting: Family Medicine

## 2013-10-27 VITALS — BP 124/82 | HR 86 | Temp 98.3°F | Ht 64.0 in | Wt 371.5 lb

## 2013-10-27 DIAGNOSIS — R5383 Other fatigue: Secondary | ICD-10-CM

## 2013-10-27 DIAGNOSIS — D508 Other iron deficiency anemias: Secondary | ICD-10-CM

## 2013-10-27 DIAGNOSIS — R5381 Other malaise: Secondary | ICD-10-CM

## 2013-10-27 DIAGNOSIS — E538 Deficiency of other specified B group vitamins: Secondary | ICD-10-CM

## 2013-10-27 DIAGNOSIS — F3289 Other specified depressive episodes: Secondary | ICD-10-CM

## 2013-10-27 DIAGNOSIS — F329 Major depressive disorder, single episode, unspecified: Secondary | ICD-10-CM

## 2013-10-27 DIAGNOSIS — F32A Depression, unspecified: Secondary | ICD-10-CM

## 2013-10-27 MED ORDER — DULOXETINE HCL 30 MG PO CPEP: 30.0000 mg | ORAL_CAPSULE | Freq: Every day | ORAL | Status: DC

## 2013-10-27 NOTE — Patient Instructions (Addendum)

## 2013-10-27 NOTE — Progress Notes (Signed)
Subjective:    Patient ID: [name], female    DOB: 1976/01/05, 38 y.o.   MRN: [id number]  HPI Pt here to discuss her weight gain and depression.  She states she believes she is hypothyroid even though her labs in the past have been normal.  She has also struggled with depression for years.  She is not suicidal but did start crying in the room.     Review of Systems As above     Past Medical History  Diagnosis Date  . Chronic anemia   . Depression     history of depression- none now  . 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 Heartburn in pregnancy   . Complication of anesthesia     pt states she was given too much anesthesia   . PONV (postoperative nausea and vomiting)   . S/P cesarean section (11/12, rpt, suspect macrosomia) 01/10/2012  . Maternal anemia complicating pregnancy, childbirth, or the puerperium 01/10/2012  . Morbidly obese 01/10/2012  . Anxiety   . Dysmenorrhea   . MTHFR mutation   . Chronic hypertension complicating or reason for care during pregnancy 01/10/2012    patient denies/ unable to remove   History   Social History  . Marital Status: Married    Spouse Name: N/A    Number of Children: N/A  . Years of Education: N/A   Occupational History  . Not on file.   Social History Main Topics  . Smoking status: Never Smoker   . Smokeless tobacco: Never Used  . Alcohol Use: No  . Drug Use: No  . Sexual Activity: Yes    Partners: Male    Birth Control/ Protection: Surgical     Comment: Tubal Ligation   Other Topics Concern  . Not on file   Social History Narrative  . No narrative on file   No family history on file. Current Outpatient Prescriptions  Medication Sig Dispense Refill  . Cyanocobalamin (VITAMIN B 12 PO) Take 1 drop by mouth daily.      . DULoxetine (CYMBALTA) 30 MG capsule Take 1 capsule (30 mg total) by mouth daily. x1 week then 2 a day  60 capsule  5  . Prenatal Vit-Fe Fumarate-FA  (MULTIVITAMIN-PRENATAL) 27-0.8 MG TABS tablet Take 1 tablet by mouth daily at 12 noon.       No current facility-administered medications for this visit.    Objective:   Physical Exam BP 124/82  Pulse 86  Temp(Src) 98.3 F (36.8 C) (Oral)  Ht 5\' 4"  (1.626 m)  Wt 371 lb 7.6 oz (168.5 kg)  BMI 63.73 kg/m2  SpO2 97%  LMP 02/28/2013  Breastfeeding? No General appearance: alert, cooperative, appears stated age and no distress, morbid obesity Neck: no adenopathy, supple, symmetrical, trachea midline and thyroid not enlarged, symmetric, no tenderness/mass/nodules Lungs: clear to auscultation bilaterally Heart: S1, S2 normal Extremities: extremities normal, atraumatic, no cyanosis or edema        Assessment & Plan:  1. Depression Counselor if needed rto 1 month or sooner prn - DULoxetine (CYMBALTA) 30 MG capsule; Take 1 capsule (30 mg total) by mouth daily. x1 week then 2 a day  Dispense: 60 capsule; Refill: 5 - Thyroid antibodies - B12 - Vitamin D (25 hydroxy)  2. Morbid obesity Discussed diet and exercise - Ambulatory referral to diabetic education - Thyroid antibodies - B12 - Vitamin D (25 hydroxy)  3. Other malaise and  fatigue   - Thyroid antibodies - B12 - Vitamin D (25 hydroxy)  4. B12 deficiency   - Thyroid antibodies - B12 - Vitamin D (25 hydroxy) - CBC with Differential - Basic metabolic panel - Hepatic function panel  5. Other iron deficiency anemias   - CBC with Differential - Basic metabolic panel - Hepatic function panel

## 2013-10-27 NOTE — Progress Notes (Signed)
Pre visit review using our clinic review tool, if applicable. No additional management support is needed unless otherwise documented below in the visit note. 

## 2013-10-28 LAB — BASIC METABOLIC PANEL
BUN: 19 mg/dL (ref 6–23)
CALCIUM: 8.7 mg/dL (ref 8.4–10.5)
CO2: 28 mEq/L (ref 19–32)
CREATININE: 0.6 mg/dL (ref 0.4–1.2)
Chloride: 102 mEq/L (ref 96–112)
GFR: 111.13 mL/min (ref >60.00)
Glucose, Bld: 86 mg/dL (ref 70–99)
Potassium: 4.3 mEq/L (ref 3.5–5.1)
Sodium: 137 mEq/L (ref 135–145)

## 2013-10-28 LAB — CBC WITH DIFFERENTIAL/PLATELET
BASOS ABS: 0.1 10*3/uL (ref 0.0–0.1)
Basophils Relative: 0.8 % (ref 0.0–3.0)
Eosinophils Absolute: 0.3 10*3/uL (ref 0.0–0.7)
Eosinophils Relative: 4.1 % (ref 0.0–5.0)
HEMATOCRIT: 41 % (ref 36.0–46.0)
Hemoglobin: 13.7 g/dL (ref 12.0–15.0)
LYMPHS ABS: 1.9 10*3/uL (ref 0.7–4.0)
LYMPHS PCT: 29.3 % (ref 12.0–46.0)
MCHC: 33.5 g/dL (ref 30.0–36.0)
MCV: 85.7 fl (ref 78.0–100.0)
Monocytes Absolute: 0.5 10*3/uL (ref 0.1–1.0)
Monocytes Relative: 7.7 % (ref 3.0–12.0)
NEUTROS ABS: 3.7 10*3/uL (ref 1.4–7.7)
Neutrophils Relative %: 58.1 % (ref 43.0–77.0)
PLATELETS: 307 10*3/uL (ref 150.0–400.0)
RBC: 4.78 Mil/uL (ref 3.87–5.11)
RDW: 13.5 % (ref 11.5–15.5)
WBC: 6.4 10*3/uL (ref 4.0–10.5)

## 2013-10-28 LAB — THYROID ANTIBODIES
THYROID PEROXIDASE ANTIBODY: 5 [IU]/mL (ref <9)
Thyroglobulin Ab: <1 IU/mL (ref <2)

## 2013-10-28 LAB — HEPATIC FUNCTION PANEL
ALT: 43 U/L — ABNORMAL HIGH (ref 0–35)
AST: 32 U/L (ref 0–37)
Albumin: 3.9 g/dL (ref 3.5–5.2)
Alkaline Phosphatase: 78 U/L (ref 39–117)
BILIRUBIN TOTAL: 0.6 mg/dL (ref 0.2–1.2)
Bilirubin, Direct: 0.1 mg/dL (ref 0.0–0.3)
Total Protein: 7.5 g/dL (ref 6.0–8.3)

## 2013-10-28 LAB — VITAMIN D 25 HYDROXY (VIT D DEFICIENCY, FRACTURES): VITD: 30.41 ng/mL (ref 30.00–100.00)

## 2013-10-28 LAB — VITAMIN B12: VITAMIN B 12: 849 pg/mL (ref 211–911)

## 2013-10-31 ENCOUNTER — Telehealth: Payer: Self-pay | Admitting: Obstetrics & Gynecology

## 2013-10-31 NOTE — Telephone Encounter (Signed)
Sandy Long from [place] [facility] is calling to speak with nurse regarding pt and her follow up care.

## 2013-10-31 NOTE — Telephone Encounter (Signed)
Spoke with [employer] from [place]. Released signed per phone call on 7/14 by patient with okay to speak with [facility]. Lovey Newcomer states that she has been missing patient and unable to get in touch with her about follow up with PCP or endocrinology. States that last time they spoke Dr.Miller did not feel it appropriate to refer to endocrinology as thyroid levels were normal. Dr.Miller advised patient to see PCP or come back in for reevaluation which patient did not want to do. Lovey Newcomer states that she provided patient a list of PCP in the the [place] area for the patient to get scheduled with. Patient would like to know if we made any referrals for her to be seen since then. Advised we have not but it appears patient was seen on 8/31 for primary care. Lovey Newcomer is agreeable and verbalizes understanding.  Routing to provider for final review. Patient agreeable to disposition. Will close encounter

## 2013-12-23 ENCOUNTER — Other Ambulatory Visit: Payer: Self-pay

## 2013-12-23 DIAGNOSIS — F32A Depression, unspecified: Secondary | ICD-10-CM

## 2013-12-23 DIAGNOSIS — F329 Major depressive disorder, single episode, unspecified: Secondary | ICD-10-CM

## 2013-12-23 MED ORDER — DULOXETINE HCL 30 MG PO CPEP: 30.0000 mg | ORAL_CAPSULE | Freq: Two times a day (BID) | ORAL | Status: DC

## 2013-12-25 ENCOUNTER — Telehealth: Payer: Self-pay | Admitting: Oncology

## 2013-12-25 NOTE — Telephone Encounter (Signed)
Lvm advising appt chg from 11/17 (md pal) to 11/16 @ 9.30.

## 2013-12-29 ENCOUNTER — Encounter: Payer: Self-pay | Admitting: Family Medicine

## 2014-01-01 ENCOUNTER — Ambulatory Visit: Payer: BC Managed Care – PPO | Admitting: Family Medicine

## 2014-01-08 ENCOUNTER — Telehealth: Payer: Self-pay | Admitting: Oncology

## 2014-01-08 NOTE — Telephone Encounter (Signed)
Pt called to see if she could get an earlier apt advised there was nothing until later Dec for earlier time, pt confirmed to keep same apt time.... KJ

## 2014-01-12 ENCOUNTER — Ambulatory Visit (HOSPITAL_BASED_OUTPATIENT_CLINIC_OR_DEPARTMENT_OTHER): Payer: BC Managed Care – PPO | Admitting: Oncology

## 2014-01-12 ENCOUNTER — Other Ambulatory Visit (HOSPITAL_BASED_OUTPATIENT_CLINIC_OR_DEPARTMENT_OTHER): Payer: BC Managed Care – PPO

## 2014-01-12 VITALS — BP 141/80 | HR 85 | Temp 98.0°F | Resp 18 | Ht 64.0 in | Wt 389.0 lb

## 2014-01-12 DIAGNOSIS — D500 Iron deficiency anemia secondary to blood loss (chronic): Secondary | ICD-10-CM

## 2014-01-12 DIAGNOSIS — D649 Anemia, unspecified: Secondary | ICD-10-CM

## 2014-01-12 DIAGNOSIS — N920 Excessive and frequent menstruation with regular cycle: Secondary | ICD-10-CM

## 2014-01-12 DIAGNOSIS — Z9884 Bariatric surgery status: Secondary | ICD-10-CM

## 2014-01-12 LAB — CBC WITH DIFFERENTIAL/PLATELET
BASO%: 0.5 % (ref 0.0–2.0)
BASOS ABS: 0 10*3/uL (ref 0.0–0.1)
EOS ABS: 0.3 10*3/uL (ref 0.0–0.5)
EOS%: 5.1 % (ref 0.0–7.0)
HEMATOCRIT: 39.3 % (ref 34.8–46.6)
HEMOGLOBIN: 12.9 g/dL (ref 11.6–15.9)
LYMPH%: 27.9 % (ref 14.0–49.7)
MCH: 27.6 pg (ref 25.1–34.0)
MCHC: 32.8 g/dL (ref 31.5–36.0)
MCV: 84.2 fL (ref 79.5–101.0)
MONO#: 0.4 10*3/uL (ref 0.1–0.9)
MONO%: 7 % (ref 0.0–14.0)
NEUT%: 59.5 % (ref 38.4–76.8)
NEUTROS ABS: 3.5 10*3/uL (ref 1.5–6.5)
Platelets: 249 10*3/uL (ref 145–400)
RBC: 4.67 10*6/uL (ref 3.70–5.45)
RDW: 13.9 % (ref 11.2–14.5)
WBC: 5.8 10*3/uL (ref 3.9–10.3)
lymph#: 1.6 10*3/uL (ref 0.9–3.3)
nRBC: 0 % (ref 0–0)

## 2014-01-12 LAB — COMPREHENSIVE METABOLIC PANEL (CC13)
ALT: 23 U/L (ref 0–55)
ANION GAP: 5 meq/L (ref 3–11)
AST: 20 U/L (ref 5–34)
Albumin: 3.5 g/dL (ref 3.5–5.0)
Alkaline Phosphatase: 74 U/L (ref 40–150)
BILIRUBIN TOTAL: 0.34 mg/dL (ref 0.20–1.20)
BUN: 11.9 mg/dL (ref 7.0–26.0)
CO2: 26 meq/L (ref 22–29)
Calcium: 8.7 mg/dL (ref 8.4–10.4)
Chloride: 110 mEq/L — ABNORMAL HIGH (ref 98–109)
Creatinine: 0.7 mg/dL (ref 0.6–1.1)
GLUCOSE: 88 mg/dL (ref 70–140)
Potassium: 4.1 mEq/L (ref 3.5–5.1)
Sodium: 141 mEq/L (ref 136–145)
TOTAL PROTEIN: 6.6 g/dL (ref 6.4–8.3)

## 2014-01-12 LAB — IRON AND TIBC CHCC
%SAT: 15 % — ABNORMAL LOW (ref 21–57)
Iron: 63 ug/dL (ref 41–142)
TIBC: 405 ug/dL (ref 236–444)
UIBC: 343 ug/dL (ref 120–384)

## 2014-01-12 LAB — FERRITIN CHCC: FERRITIN: 14 ng/mL (ref 9–269)

## 2014-01-12 NOTE — Progress Notes (Signed)
Hematology and Oncology Follow Up Visit  [name] [id number] [birth year] 38 y.o. 01/12/2014 9:53 AM  Principle Diagnosis: 38 year old woman with iron deficiency anemia likely due to menorrhagia and gastric bypass surgery in the past first evaluated in November of 2014.   Prior Therapy: She is status post Feraheme IV infusion on 01/10/2013 that was repeated on 03/07/2013. She is status post hysterectomy in February 2015 due to menorrhagia.  Current therapy: Currently on oral iron supplements as a part of multivitamin.  Interim History: [name] presents today for a followup visit. Since her last visit, she continues to do well without any major issues. She underwent hysterectomy in February of 2015 and since that have not had any bleeding. She has not reported any chest pain shortness of breath or difficulty breathing. She is not reporting any GI bleeding or GU bleeding at this time. She does not report any headaches or blurry vision. Did not report any syncope or seizures. She does not report any nausea or vomiting or change in her bowel habits. She does not report any lymphadenopathy or skin rashes. She does not report any petechiae or bleeding. Rest of her review of systems unremarkable.  Medications: I have reviewed the patient's current medications.  Current Outpatient Prescriptions  Medication Sig Dispense Refill  . Cyanocobalamin (VITAMIN B 12 PO) Take 1 drop by mouth daily.    . DULoxetine (CYMBALTA) 30 MG capsule Take 1 capsule (30 mg total) by mouth 2 (two) times daily. 180 capsule 1  . Prenatal Vit-Fe Fumarate-FA (MULTIVITAMIN-PRENATAL) 27-0.8 MG TABS tablet Take 1 tablet by mouth daily at 12 noon.     No current facility-administered medications for this visit.     Allergies:  Allergies  Allergen Reactions  . Adhesive [Tape]     Redness, hives  . Amoxicillin Hives    Childhood allergy. Pt. Can tolerate Cephalosporins.    Past Medical History, Surgical history,  Social history, and Family History were reviewed and updated.   Physical Exam: Blood pressure 141/80, pulse 85, temperature 98 F (36.7 C), temperature source Oral, resp. rate 18, height 5\' 4"  (1.626 m), weight 389 lb (176.449 kg), last menstrual period 02/28/2013. ECOG: 0 General appearance: alert, cooperative and appears stated age Head: Normocephalic, without obvious abnormality, atraumatic Neck: no adenopathy Lymph nodes: Cervical, supraclavicular, and axillary nodes normal. Heart:regular rate and rhythm, S1, S2 normal, no murmur, click, rub or gallop Lung:chest clear, no wheezing, rales, normal symmetric air entry Abdomin: soft, non-tender, without masses or organomegaly EXT:no erythema, induration, or nodules   Lab Results: Lab Results  Component Value Date   WBC 5.8 01/12/2014   HGB 12.9 01/12/2014   HCT 39.3 01/12/2014   MCV 84.2 01/12/2014   PLT 249 01/12/2014     Chemistry      Component Value Date/Time   NA 137 10/27/2013 1416   NA 142 01/09/2013 1024   K 4.3 10/27/2013 1416   K 3.9 01/09/2013 1024   CL 102 10/27/2013 1416   CO2 28 10/27/2013 1416   CO2 23 01/09/2013 1024   BUN 19 10/27/2013 1416   BUN 19.7 01/09/2013 1024   CREATININE 0.6 10/27/2013 1416   CREATININE 0.58 03/14/2013 0908   CREATININE 0.8 01/09/2013 1024      Component Value Date/Time   CALCIUM 8.7 10/27/2013 1416   CALCIUM 9.3 01/09/2013 1024   ALKPHOS 78 10/27/2013 1416   ALKPHOS 67 01/09/2013 1024   AST 32 10/27/2013 1416   AST 22 01/09/2013 1024  ALT 43* 10/27/2013 1416   ALT 18 01/09/2013 1024   BILITOT 0.6 10/27/2013 1416   BILITOT 0.28 01/09/2013 1024       Impression and Plan:  38 year old woman with the following issues:  1. Iron deficiency anemia: This is related to menorrhagia and her inability to absorb oral iron compounded by possibly gastric bypass surgery. Her hemoglobin is 12.9 today and does not require any IV iron at this time. Since her hysterectomy, her  hemoglobin remained stable and her iron stores remain adequate. I see no need for any IV iron at this point and I doubt she will require it in the future. However, I'll be happy to reevaluate her in the future  if she develops worsening anemia that requires IV iron.  She tells me that her primary care physician drains routine CBCs and would like to minimize duplicate laboratory testing.  2. Menorrhagia: She is status post hysterectomy which have resolved this issue.  [facility], MD 11/16/20159:53 AM

## 2014-01-13 ENCOUNTER — Other Ambulatory Visit: Payer: BC Managed Care – PPO

## 2014-01-13 ENCOUNTER — Ambulatory Visit: Payer: BC Managed Care – PPO | Admitting: Oncology

## 2014-05-08 DIAGNOSIS — Z0289 Encounter for other administrative examinations: Secondary | ICD-10-CM

## 2014-09-09 ENCOUNTER — Ambulatory Visit (INDEPENDENT_AMBULATORY_CARE_PROVIDER_SITE_OTHER): Payer: BLUE CROSS/BLUE SHIELD | Admitting: Obstetrics and Gynecology

## 2014-09-09 ENCOUNTER — Encounter: Payer: Self-pay | Admitting: Obstetrics and Gynecology

## 2014-09-09 ENCOUNTER — Other Ambulatory Visit: Payer: Self-pay | Admitting: Obstetrics and Gynecology

## 2014-09-09 ENCOUNTER — Telehealth: Payer: Self-pay | Admitting: Obstetrics & Gynecology

## 2014-09-09 VITALS — BP 130/70 | HR 84 | Resp 16 | Wt 397.0 lb

## 2014-09-09 DIAGNOSIS — IMO0002 Reserved for concepts with insufficient information to code with codable children: Secondary | ICD-10-CM

## 2014-09-09 DIAGNOSIS — R6882 Decreased libido: Secondary | ICD-10-CM | POA: Diagnosis not present

## 2014-09-09 DIAGNOSIS — R300 Dysuria: Secondary | ICD-10-CM

## 2014-09-09 DIAGNOSIS — Z9884 Bariatric surgery status: Secondary | ICD-10-CM | POA: Diagnosis not present

## 2014-09-09 DIAGNOSIS — N898 Other specified noninflammatory disorders of vagina: Secondary | ICD-10-CM | POA: Diagnosis not present

## 2014-09-09 DIAGNOSIS — F4323 Adjustment disorder with mixed anxiety and depressed mood: Secondary | ICD-10-CM

## 2014-09-09 DIAGNOSIS — R5383 Other fatigue: Secondary | ICD-10-CM | POA: Diagnosis not present

## 2014-09-09 DIAGNOSIS — N941 Dyspareunia: Secondary | ICD-10-CM | POA: Diagnosis not present

## 2014-09-09 DIAGNOSIS — F39 Unspecified mood [affective] disorder: Secondary | ICD-10-CM | POA: Diagnosis not present

## 2014-09-09 DIAGNOSIS — L659 Nonscarring hair loss, unspecified: Secondary | ICD-10-CM

## 2014-09-09 DIAGNOSIS — R8290 Unspecified abnormal findings in urine: Secondary | ICD-10-CM

## 2014-09-09 DIAGNOSIS — R4586 Emotional lability: Secondary | ICD-10-CM

## 2014-09-09 LAB — LIPID PANEL
Cholesterol: 202 mg/dL — ABNORMAL HIGH (ref 0–200)
HDL: 52 mg/dL (ref >=46)
LDL Cholesterol: 126 mg/dL — ABNORMAL HIGH (ref 0–99)
TRIGLYCERIDES: 121 mg/dL (ref <150)
Total CHOL/HDL Ratio: 3.9 Ratio
VLDL: 24 mg/dL (ref 0–40)

## 2014-09-09 LAB — FERRITIN: Ferritin: 11 ng/mL (ref 10–291)

## 2014-09-09 LAB — CBC
HCT: 38.1 % (ref 36.0–46.0)
Hemoglobin: 12.4 g/dL (ref 12.0–15.0)
MCH: 26.1 pg (ref 26.0–34.0)
MCHC: 32.5 g/dL (ref 30.0–36.0)
MCV: 80 fL (ref 78.0–100.0)
MPV: 9.5 fL (ref 8.6–12.4)
Platelets: 314 10*3/uL (ref 150–400)
RBC: 4.76 MIL/uL (ref 3.87–5.11)
RDW: 15 % (ref 11.5–15.5)
WBC: 6.6 10*3/uL (ref 4.0–10.5)

## 2014-09-09 LAB — COMPREHENSIVE METABOLIC PANEL
ALBUMIN: 3.8 g/dL (ref 3.5–5.2)
ALK PHOS: 67 U/L (ref 39–117)
ALT: 15 U/L (ref 0–35)
AST: 20 U/L (ref 0–37)
BILIRUBIN TOTAL: 0.4 mg/dL (ref 0.2–1.2)
BUN: 10 mg/dL (ref 6–23)
CO2: 24 mEq/L (ref 19–32)
Calcium: 8.9 mg/dL (ref 8.4–10.5)
Chloride: 105 mEq/L (ref 96–112)
Creat: 0.71 mg/dL (ref 0.50–1.10)
Glucose, Bld: 80 mg/dL (ref 70–99)
POTASSIUM: 4.3 meq/L (ref 3.5–5.3)
SODIUM: 140 meq/L (ref 135–145)
Total Protein: 6.7 g/dL (ref 6.0–8.3)

## 2014-09-09 LAB — POCT URINALYSIS DIPSTICK
Bilirubin, UA: NEGATIVE
Glucose, UA: NEGATIVE
Ketones, UA: NEGATIVE
Nitrite, UA: NEGATIVE
Protein, UA: NEGATIVE
SPEC GRAV UA: 1.01
Urobilinogen, UA: NEGATIVE
pH, UA: 7

## 2014-09-09 LAB — FOLATE: Folate: 7.8 ng/mL

## 2014-09-09 LAB — IRON: Iron: 54 ug/dL (ref 42–145)

## 2014-09-09 LAB — TSH: TSH: 2.019 u[IU]/mL (ref 0.350–4.500)

## 2014-09-09 MED ORDER — BUPROPION HCL ER (XL) 150 MG PO TB24: 150.0000 mg | ORAL_TABLET | Freq: Every day | ORAL | Status: DC

## 2014-09-09 MED ORDER — NITROFURANTOIN MONOHYD MACRO 100 MG PO CAPS: 100.0000 mg | ORAL_CAPSULE | Freq: Two times a day (BID) | ORAL | Status: DC

## 2014-09-09 MED ORDER — ESTRADIOL 0.1 MG/GM VA CREA: TOPICAL_CREAM | VAGINAL | Status: DC

## 2014-09-09 NOTE — Patient Instructions (Addendum)
Get a coupon on line for the estrace cream  You can also try over the counter vaginal moisturizers (ie replense)  F/U in 1 month

## 2014-09-09 NOTE — Telephone Encounter (Signed)
Patient says she is hurting in vaginal area and may have a bladder infection.

## 2014-09-09 NOTE — Telephone Encounter (Signed)
Spoke with patient. Patient states that she began to have vaginal discomfort 3 days ago. Is now having increased urinary frequency and burning with urination. Denies any fevers, chills, or lower back pain. Has UTI one month ago and was seen with [facility] for treatment. "I feel like I keep getting UTI's after intercourse. Also since I had my hysterectomy I feel like things are off." Advised will need to be seen with provider today for further evaluation. Patient is agreeable. Appointment scheduled for 09/09/2014 at 1:15pm with Dr.Jertson. Patient is agreeable to date and time and to see a different provider as Dr.Miller is out of the office today.  Routing to provider for final review. Patient agreeable to disposition. Will close encounter.   Patient aware provider will review message and nurse will return call if any additional advice or change of disposition.

## 2014-09-09 NOTE — Progress Notes (Signed)
Patient ID: [name], female   DOB: [birth year], 39 y.o.   MRN: [id number] GYNECOLOGY  VISIT HPI: 39 y.o.   Married  Caucasian  female   [phone] with Patient's last menstrual period was 02/28/2013.   here for dysuria, urinary urgency and urinary frequency X 3 days. Voiding normal amounts. Burns internally and externally with voiding. She also c/o pain the vaginal area, started after intercourse 3 days ago. No pruritus, no vaginal d/c. She is also having pain with intercourse on entry, lubricant only helps a little. Feels very dry. No desire for intercourse, seems worse since she had her hysterectomy.   She also c/o small amounts of urinary leakage randomly throughout the day since her hysterectomy. S/P robotic TLH/BS in 2/16.   She c/o inability to loose weight, doesn't eat much, eats healthy. S/P gastric bypass years ago. Lost over 200 lbs, gained weight with her pregnancies. She states she hasn't had bariatic blood work for years. C/O depression, hair loss, anxiety, severe fatigue (has insomnia), dry skin. Severe mood swings (no change). She feels like something is wrong and no one is taking her seriously. She didn't feel well on cymbalta. Currently not on any antidepressant/antianxiety meds.   GYNECOLOGIC HISTORY: Patient's last menstrual period was 02/28/2013. Contraception:Hyterectomy  Menopausal hormone therapy: N/A Last mammogram: 05-04-08 -WNL Last pap smear: 03-24-13 -WNL        OB History    Gravida Para Term Preterm AB TAB SAB Ectopic Multiple Living   4 2 2  0 2 0 2 0 0 2         Patient Active Problem List   Diagnosis Date Noted  . Depression 08/18/2013  . 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 Chronic anemia 01/11/2012  . S/P cesarean section (11/12, rpt & BTL) 01/10/2012  . Chronic hypertension complicating or reason for care during pregnancy 01/10/2012  . Morbidly obese 01/10/2012  . LBBB (left bundle  branch block) 09/25/2011  . MVP (mitral valve prolapse) 09/25/2011    Past Medical History  Diagnosis Date  . Chronic anemia   . Depression     history of depression- none now  . 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 Heartburn in pregnancy   . Complication of anesthesia     pt states she was given too much anesthesia   . PONV (postoperative nausea and vomiting)   . S/P cesarean section (11/12, rpt, suspect macrosomia) 01/10/2012  . Maternal anemia complicating pregnancy, childbirth, or the puerperium 01/10/2012  . Morbidly obese 01/10/2012  . Anxiety   . Dysmenorrhea   . MTHFR mutation   . Chronic hypertension complicating or reason for care during pregnancy 01/10/2012    patient denies/ unable to remove    Past Surgical History  Procedure Laterality Date  . Gastric bypass  2003    with cholecystectomy, in [place]  . Cardiovascular stress test  2004    revealed anterior apical ischemia.  . Cesarean section  6/08    Dr. Ronita Hipps  . Cesarean section with bilateral tubal ligation  01/09/2012    Procedure: CESAREAN SECTION WITH BILATERAL TUBAL LIGATION;  Surgeon: Princess Bruins, MD;  Location: [facility];  Service: Obstetrics;  Laterality: Bilateral;  Repeat C/S   EDD: 01/11/12  . Cardiac catheterization  11-07-2002    The left anterior descending artery is smooth and normal.  There are several moderate sized diagonal branch which are normal. The left circumflex artery is normal. It is a very large vessel. The right coronary artery is large and dominant. EF 65%. Smooth and normal coronary arteries. Normal left ventricular systolic function.   . Tubal ligation    . Robotic assisted total hysterectomy Bilateral 04/07/2013    Procedure: ROBOTIC ASSISTED TOTAL HYSTERECTOMY WITH  BILATERAL SALPINGECTOMY;  Surgeon: Lyman Speller, MD;  Location: [facility];  Service: Gynecology;  Laterality: Bilateral;  extra time for BMI  . Cystoscopy N/A 04/07/2013    Procedure:  CYSTOSCOPY;  Surgeon: Lyman Speller, MD;  Location: [facility];  Service: Gynecology;  Laterality: N/A;  . Abdominal hysterectomy      Current Outpatient Prescriptions  Medication Sig Dispense Refill  . Prenatal Vit-Fe Fumarate-FA (MULTIVITAMIN-PRENATAL) 27-0.8 MG TABS tablet Take 1 tablet by mouth daily at 12 noon.    Marland Kitchen buPROPion (WELLBUTRIN XL) 150 MG 24 hr tablet Take 1 tablet (150 mg total) by mouth daily. 30 tablet 1  . estradiol (ESTRACE) 0.1 MG/GM vaginal cream 1 gram vaginally qhs x 1 week, then  twice weekly 42.5 g 6  . nitrofurantoin, macrocrystal-monohydrate, (MACROBID) 100 MG capsule Take 1 capsule (100 mg total) by mouth 2 (two) times daily. 10 capsule 0   No current facility-administered medications for this visit.     ALLERGIES: Adhesive and Amoxicillin  History reviewed. No pertinent family history.  History   Social History  . Marital Status: Married    Spouse Name: N/A  . Number of Children: N/A  . Years of Education: N/A   Occupational History  . Not on file.   Social History Main Topics  . Smoking status: Never Smoker   . Smokeless tobacco: Never Used  . Alcohol Use: No  . Drug Use: No  . Sexual Activity:    Partners: Male    Birth Control/ Protection: Surgical     Comment: Tubal Ligation   Other Topics Concern  . Not on file   Social History Narrative    ROS:  Pertinent items are noted in HPI.  PHYSICAL EXAMINATION:    BP 130/70 mmHg  Pulse 84  Resp 16  Wt 397 lb (180.078 kg)  LMP 02/28/2013    General appearance: alert, cooperative and appears stated age Neck: no adenopathy, supple, symmetrical, trachea midline and thyroid normal to inspection and palpation Abdomen: soft, non-tender; bowel sounds normal; no masses,  no organomegaly Extremities: extremities normal, atraumatic, no cyanosis or edema Skin: Skin color, texture, turgor normal. No rashes or lesions Lymph nodes: Cervical nodes normal. No abnormal inguinal nodes  palpated Neurologic: Grossly normal  Pelvic: External genitalia:  no lesions              Urethra:  normal appearing urethra with no masses, tenderness or lesions              Bartholins and Skenes: normal                 Vagina:dry, no abnormal d/c, ? mild atrophy  Cervix absent  Chaperone was present for exam.  Wet prep: no clue, no trich, few WBC KOH: no yeast PH 4.5  ASSESSMENT Dysuria Vaginal pain, ? Mild atrophy Dyspareunia Low libido H/o gastric bypass Multiple c/o including fatigue, inability to loose weight, hair loss, mood changes    PLAN  Send urine for ua, c&s Treat for presumed cystitis Use lubrication with intercourse Void prior to and after intercourse Trial of  vaginal estrogen Given reading suggestions for low libido  Will draw recommend post gastric bypass labs as well as testosterone, FSH, TSH Consider consultation with endocrinology Will do a trial of Wellbutrin F/U in 1 month, sooner if needed  An After Visit Summary was printed and given to the patient.   Over 45  minutes face to face time of which over 50% was spent in counseling.

## 2014-09-10 ENCOUNTER — Other Ambulatory Visit: Payer: Self-pay | Admitting: Obstetrics and Gynecology

## 2014-09-10 LAB — FOLLICLE STIMULATING HORMONE: FSH: 8.6 m[IU]/mL

## 2014-09-10 LAB — URINALYSIS, MICROSCOPIC ONLY
Bacteria, UA: NONE SEEN
CASTS: NONE SEEN
CRYSTALS: NONE SEEN

## 2014-09-10 LAB — VITAMIN D 25 HYDROXY (VIT D DEFICIENCY, FRACTURES): VIT D 25 HYDROXY: 23 ng/mL — AB (ref 30–100)

## 2014-09-10 LAB — TESTOSTERONE, FREE, TOTAL, SHBG
Sex Hormone Binding: 52 nmol/L (ref 17–124)
TESTOSTERONE FREE: 8.5 pg/mL — AB (ref 0.6–6.8)
TESTOSTERONE: 63 ng/dL (ref 10–70)
Testosterone-% Free: 1.4 % (ref 0.4–2.4)

## 2014-09-11 ENCOUNTER — Telehealth: Payer: Self-pay

## 2014-09-11 LAB — ZINC: Zinc: 71 ug/dL (ref 60–130)

## 2014-09-11 LAB — VITAMIN B12: Vitamin B-12: 283 pg/mL (ref 211–911)

## 2014-09-11 NOTE — Telephone Encounter (Signed)
-----   Message from Salvadore Dom, MD sent at 09/10/2014 10:23 AM EDT ----- Please inform the patient that most of her labs are back. Her free testosterone level is slightly elevated, not to a concerning level (doesn't explain her low libido), her vit D level is low, her FSH is in a pre-menopausal range, her iron levels are in the low normal range, but she is not currently anemic. Awaiting her urine culture. The rest of the resulted labs are normal. She should increase her vit D intake by 1,000 IU a day. I will order a consultation for Endocrinology for obesity, hair loss, fatigue and mood changes.

## 2014-09-11 NOTE — Telephone Encounter (Signed)
Left message to call [name] at [phone]. 

## 2014-09-12 LAB — URINE CULTURE: Colony Count: >=100000

## 2014-09-13 LAB — COPPER, SERUM: Copper: 148 ug/dL (ref 70–175)

## 2014-09-15 ENCOUNTER — Other Ambulatory Visit: Payer: Self-pay | Admitting: Obstetrics and Gynecology

## 2014-09-15 DIAGNOSIS — R5383 Other fatigue: Secondary | ICD-10-CM

## 2014-09-15 DIAGNOSIS — R4586 Emotional lability: Secondary | ICD-10-CM

## 2014-09-15 DIAGNOSIS — L659 Nonscarring hair loss, unspecified: Secondary | ICD-10-CM

## 2014-09-15 NOTE — Telephone Encounter (Signed)
Result Note     Please inform the patient that she has a UTI and treat with Bactrim DS, 1 tab po BID x 3 days.   Left message to call Gem at [phone].

## 2014-09-15 NOTE — Addendum Note (Signed)
Addended by: Michele Mcalpine on: 09/15/2014 01:47 PM   Modules accepted: Orders

## 2014-09-16 MED ORDER — SULFAMETHOXAZOLE-TRIMETHOPRIM 800-160 MG PO TABS: 1.0000 | ORAL_TABLET | Freq: Two times a day (BID) | ORAL | Status: DC

## 2014-09-16 NOTE — Telephone Encounter (Signed)
Returning call.

## 2014-09-16 NOTE — Telephone Encounter (Signed)
Spoke with patient. Advised of all results as seen below. Patient is agreeable and verbalizes understanding. Rx for Bactrim DS 1 tab po BID x 3 days #6 0RF sent to pharmacy on file for patient. Aware referral has been placed to Endocrinologist and that she will be contacted by our referrals coordinator in the office or Dr.Ellison's office directly regarding scheduling. Patient is agreeable. Will increase Vitamin D to 1000 IU daily.  Cc: Theresia Lo  Routing to provider for final review. Patient agreeable to disposition. Will close encounter.   Patient aware provider will review message and nurse will return call if any additional advice or change of disposition.

## 2014-10-07 ENCOUNTER — Ambulatory Visit (INDEPENDENT_AMBULATORY_CARE_PROVIDER_SITE_OTHER): Payer: BLUE CROSS/BLUE SHIELD | Admitting: Obstetrics and Gynecology

## 2014-10-07 ENCOUNTER — Encounter: Payer: Self-pay | Admitting: Obstetrics and Gynecology

## 2014-10-07 VITALS — BP 120/76 | HR 84 | Resp 16 | Wt 396.0 lb

## 2014-10-07 DIAGNOSIS — R300 Dysuria: Secondary | ICD-10-CM | POA: Diagnosis not present

## 2014-10-07 LAB — POCT URINALYSIS DIPSTICK
Bilirubin, UA: NEGATIVE
Blood, UA: NEGATIVE
GLUCOSE UA: NEGATIVE
KETONES UA: NEGATIVE
Nitrite, UA: NEGATIVE
PROTEIN UA: NEGATIVE
SPEC GRAV UA: 1.015
Urobilinogen, UA: NEGATIVE
pH, UA: 7.5

## 2014-10-07 MED ORDER — BUPROPION HCL ER (XL) 300 MG PO TB24: 300.0000 mg | ORAL_TABLET | Freq: Every day | ORAL | Status: DC

## 2014-10-07 NOTE — Patient Instructions (Signed)
Call on Friday for your urine results if you don't hear from [place].

## 2014-10-07 NOTE — Progress Notes (Signed)
Patient ID: [name], female   DOB: 1975-12-21, 39 y.o.   MRN: [id number] GYNECOLOGY  VISIT   HPI: 39 y.o.   Married  Caucasian  female   [phone] with Patient's last menstrual period was 02/28/2013.  She was started on Wellbutrin last month, she is feeling calmer since starting it, less anxious, not sure if it is helping the depression. She would like to try the next dose up on the Wellbutrin. She was also started on vaginal estrogen last month, has been forgetting to take it, may be helping a little. She hasn't been sexually active yet since starting the estrogen.  here to follow up on her UTI. She is still having some discomfort when urinating. Last month she was treated for a UTI, felt better after the antibiotics. It no longer hurts to void, feels a slight discomfort/pressure. Intermittent frequency and urgency.   GYNECOLOGIC HISTORY: Patient's last menstrual period was 02/28/2013. Contraception: Hysterectomy Menopausal hormone therapy: Estradiol Last mammogram: N/A Last pap smear: 03-24-13 WNL NEG HR HPV        OB History    Gravida Para Term Preterm AB TAB SAB Ectopic Multiple Living   4 2 2  0 2 0 2 0 0 2         Patient Active Problem List   Diagnosis Date Noted  . Depression 08/18/2013  . 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 Chronic anemia 01/11/2012  . S/P cesarean section (11/12, rpt & BTL) 01/10/2012  . Chronic hypertension complicating or reason for care during pregnancy 01/10/2012  . Morbidly obese 01/10/2012  . LBBB (left bundle branch block) 09/25/2011  . MVP (mitral valve prolapse) 09/25/2011    Past Medical History  Diagnosis Date  . Chronic anemia   . Depression     history of depression- none now  . 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 Heartburn in pregnancy   . Complication of anesthesia     pt states she was given too much anesthesia   . PONV  (postoperative nausea and vomiting)   . S/P cesarean section (11/12, rpt, suspect macrosomia) 01/10/2012  . Maternal anemia complicating pregnancy, childbirth, or the puerperium 01/10/2012  . Morbidly obese 01/10/2012  . Anxiety   . Dysmenorrhea   . MTHFR mutation   . Chronic hypertension complicating or reason for care during pregnancy 01/10/2012    patient denies/ unable to remove    Past Surgical History  Procedure Laterality Date  . Gastric bypass  2003    with cholecystectomy, in [place]  . Cardiovascular stress test  2004    revealed anterior apical ischemia.  . Cesarean section  6/08    Dr. Ronita Hipps  . Cesarean section with bilateral tubal ligation  01/09/2012    Procedure: CESAREAN SECTION WITH BILATERAL TUBAL LIGATION;  Surgeon: Princess Bruins, MD;  Location: [facility];  Service: Obstetrics;  Laterality: Bilateral;  Repeat C/S   EDD: 01/11/12  . Cardiac catheterization  11-07-2002    The left anterior descending artery is smooth and normal. There are several moderate sized diagonal branch which are normal. The left circumflex artery is normal. It is a very large vessel. The right coronary artery is large and dominant. EF 65%. Smooth and normal coronary arteries. Normal left ventricular systolic function.   . Tubal ligation    . Robotic assisted total  hysterectomy Bilateral 04/07/2013    Procedure: ROBOTIC ASSISTED TOTAL HYSTERECTOMY WITH  BILATERAL SALPINGECTOMY;  Surgeon: Lyman Speller, MD;  Location: [facility];  Service: Gynecology;  Laterality: Bilateral;  extra time for BMI  . Cystoscopy N/A 04/07/2013    Procedure: CYSTOSCOPY;  Surgeon: Lyman Speller, MD;  Location: [facility];  Service: Gynecology;  Laterality: N/A;  . Abdominal hysterectomy      Current Outpatient Prescriptions  Medication Sig Dispense Refill  . buPROPion (WELLBUTRIN XL) 150 MG 24 hr tablet Take 1 tablet (150 mg total) by mouth daily. 30 tablet 1  . estradiol (ESTRACE) 0.1 MG/GM vaginal cream 1 gram  vaginally qhs x 1 week, then  twice weekly 42.5 g 6  . Prenatal Vit-Fe Fumarate-FA (MULTIVITAMIN-PRENATAL) 27-0.8 MG TABS tablet Take 1 tablet by mouth daily at 12 noon.     No current facility-administered medications for this visit.     ALLERGIES: Adhesive and Amoxicillin  History reviewed. No pertinent family history.  Social History   Social History  . Marital Status: Married    Spouse Name: N/A  . Number of Children: N/A  . Years of Education: N/A   Occupational History  . Not on file.   Social History Main Topics  . Smoking status: Never Smoker   . Smokeless tobacco: Never Used  . Alcohol Use: No  . Drug Use: No  . Sexual Activity:    Partners: Male    Birth Control/ Protection: Surgical     Comment: Tubal Ligation   Other Topics Concern  . Not on file   Social History Narrative    ROS:  Pertinent items are noted in HPI.  PHYSICAL EXAMINATION:    BP 120/76 mmHg  Pulse 84  Resp 16  Wt 396 lb (179.624 kg)  LMP 02/28/2013    General appearance: alert, cooperative and appears stated age  ASSESSMENT Depression, feels slightly calmer Vaginal atrophy, she hasn't been using the estrace 2 x a week, hasn't been sexually active since her last visit Mild dysuria, s/p UTI last month     PLAN  Will increase her Wellbutrin to 300 mg a day She will use the estrogen vaginal cream 2 x a week Use a lubricant with intercourse Send urine for ua, c&s Will set up an appointment with endocrinology for evaluation of multiple systemic c/o.   An After Visit Summary was printed and given to the patient.  15 minutes face to face time of which over 50% was spent in counseling.

## 2014-10-08 ENCOUNTER — Telehealth: Payer: Self-pay

## 2014-10-08 DIAGNOSIS — Z8639 Personal history of other endocrine, nutritional and metabolic disease: Secondary | ICD-10-CM

## 2014-10-08 LAB — URINALYSIS, MICROSCOPIC ONLY
BACTERIA UA: NONE SEEN [HPF]
Casts: NONE SEEN [LPF]
Crystals: NONE SEEN [HPF]
WBC UA: NONE SEEN WBC/HPF (ref <=5)
Yeast: NONE SEEN [HPF]

## 2014-10-08 NOTE — Telephone Encounter (Signed)
Left message to call Kaitlyn at [phone]. 

## 2014-10-08 NOTE — Telephone Encounter (Signed)
Please make sure the patient is aware of the process.

## 2014-10-08 NOTE — Telephone Encounter (Signed)
After speaking with Dr.Jertson regarding referral. New referral has been placed for patient to be seen at [facility] with Dr.Ellison for a history of hypoglycemia. Spoke with [facility] who states referral will be reviewed by MD with their office and if approved they will call the patient to schedule the appointment. Will keep in my inbox to make sure she is schedule or if not that we follow up with further recommendations.  Cc: Theresia Lo

## 2014-10-08 NOTE — Telephone Encounter (Signed)
-----   Message from Salvadore Dom, MD sent at 10/07/2014  2:19 PM EDT ----- This patient was supposed to see endocrinology. Please see my note from last month. I think I ordered the consult, let me know if I need to do something else. Thanks, Sharee Pimple

## 2014-10-09 LAB — URINE CULTURE
Colony Count: NO GROWTH
ORGANISM ID, BACTERIA: NO GROWTH

## 2014-10-09 NOTE — Telephone Encounter (Signed)
2d ago    Colony Count NO GROWTH   Organism ID, Bacteria NO GROWTH   Resulting Agency [facility]    Narrative     Performed at: [address], [address]        [place], [place] [address]       Specimen Collected: 10/07/14 2:33 PM Last Resulted: 10/09/14 2:10 AM

## 2014-10-09 NOTE — Telephone Encounter (Signed)
Spoke with patient. Advised of urine culture results as seen below. Patient is agreeable. Patient is aware of appointment with [facility] on 10/23/2014. Will call if she needs anything prior to her follow up appointment with Dr.Jertson on 11/05/2014.  Routing to provider for final review. Patient agreeable to disposition. Will close encounter.   Patient aware provider will review message and nurse will return call if any additional advice or change of disposition.

## 2014-10-09 NOTE — Telephone Encounter (Signed)
Per appointment desk in epic Ms Chopp is scheduled to see Dr Loanne Drilling on 10/23/14.

## 2014-10-23 ENCOUNTER — Ambulatory Visit (INDEPENDENT_AMBULATORY_CARE_PROVIDER_SITE_OTHER): Payer: BLUE CROSS/BLUE SHIELD | Admitting: Endocrinology

## 2014-10-23 ENCOUNTER — Encounter: Payer: Self-pay | Admitting: Endocrinology

## 2014-10-23 VITALS — BP 118/68 | HR 78 | Temp 98.6°F | Resp 12 | Ht 64.0 in | Wt 391.6 lb

## 2014-10-23 DIAGNOSIS — R635 Abnormal weight gain: Secondary | ICD-10-CM | POA: Diagnosis not present

## 2014-10-23 MED ORDER — DEXAMETHASONE 1 MG PO TABS: ORAL_TABLET | ORAL | Status: DC

## 2014-10-23 NOTE — Progress Notes (Signed)
Subjective:    Patient ID: [name], female    DOB: 04-18-1975, 39 y.o.   MRN: [id number]  HPI Pt states few years of moderately dry skin, worse in the eac's, and assoc depression.  Antidepressant rx has not helped.  She has seen multiple health providers for these sxs.   Past Medical History  Diagnosis Date  . Chronic anemia   . Depression     history of depression- none now  . 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 Heartburn in pregnancy   . Complication of anesthesia     pt states she was given too much anesthesia   . PONV (postoperative nausea and vomiting)   . S/P cesarean section (11/12, rpt, suspect macrosomia) 01/10/2012  . Maternal anemia complicating pregnancy, childbirth, or the puerperium 01/10/2012  . Morbidly obese 01/10/2012  . Anxiety   . Dysmenorrhea   . MTHFR mutation   . Chronic hypertension complicating or reason for care during pregnancy 01/10/2012    patient denies/ unable to remove    Past Surgical History  Procedure Laterality Date  . Gastric bypass  2003    with cholecystectomy, in [place]  . Cardiovascular stress test  2004    revealed anterior apical ischemia.  . Cesarean section  6/08    Dr. Ronita Hipps  . Cesarean section with bilateral tubal ligation  01/09/2012    Procedure: CESAREAN SECTION WITH BILATERAL TUBAL LIGATION;  Surgeon: Princess Bruins, MD;  Location: [facility];  Service: Obstetrics;  Laterality: Bilateral;  Repeat C/S   EDD: 01/11/12  . Cardiac catheterization  11-07-2002    The left anterior descending artery is smooth and normal. There are several moderate sized diagonal branch which are normal. The left circumflex artery is normal. It is a very large vessel. The right coronary artery is large and dominant. EF 65%. Smooth and normal coronary arteries. Normal left ventricular systolic function.   . Tubal ligation    . Robotic assisted total hysterectomy Bilateral 04/07/2013    Procedure: ROBOTIC ASSISTED  TOTAL HYSTERECTOMY WITH  BILATERAL SALPINGECTOMY;  Surgeon: Lyman Speller, MD;  Location: [facility];  Service: Gynecology;  Laterality: Bilateral;  extra time for BMI  . Cystoscopy N/A 04/07/2013    Procedure: CYSTOSCOPY;  Surgeon: Lyman Speller, MD;  Location: [facility];  Service: Gynecology;  Laterality: N/A;  . Abdominal hysterectomy      Social History   Social History  . Marital Status: Married    Spouse Name: N/A  . Number of Children: N/A  . Years of Education: N/A   Occupational History  . Not on file.   Social History Main Topics  . Smoking status: Never Smoker   . Smokeless tobacco: Never Used  . Alcohol Use: No  . Drug Use: No  . Sexual Activity:    Partners: Male    Birth Control/ Protection: Surgical     Comment: Tubal Ligation   Other Topics Concern  . Not on file   Social History Narrative    Current Outpatient Prescriptions on File Prior to Visit  Medication Sig Dispense Refill  . buPROPion (WELLBUTRIN XL) 300 MG 24 hr tablet Take 1 tablet (300 mg total) by mouth daily. 30 tablet 1  . estradiol (ESTRACE) 0.1 MG/GM vaginal cream 1 gram vaginally qhs x 1 week, then  twice weekly (Patient not taking: Reported on 10/23/2014) 42.5 g 6  . Prenatal Vit-Fe Fumarate-FA (MULTIVITAMIN-PRENATAL) 27-0.8  MG TABS tablet Take 1 tablet by mouth daily at 12 noon.     No current facility-administered medications on file prior to visit.    Allergies  Allergen Reactions  . Adhesive [Tape]     Redness, hives  . Amoxicillin Hives    Childhood allergy. Pt. Can tolerate Cephalosporins.    No family history on file.  BP 118/68 mmHg  Pulse 78  Temp(Src) 98.6 F (37 C) (Oral)  Resp 12  Ht 5\' 4"  (1.626 m)  Wt 391 lb 9.6 oz (177.629 kg)  BMI 67.19 kg/m2  SpO2 98%  LMP 02/28/2013  Review of Systems She has anxiety, unsteadiness on her feet, generalized weakness, intermittent diarrhea, difficulty with concentration, decreased libido, acne on the chest, fatigue, sob,  excessive appetite, headache, rhinorrhea, menorrhagia (until TAH), slight hair growth on the face, easy bruising, and insomnia.  She had gastric bypass in 2003.  She lost 214 lbs, but she has since regained 190 of that.  She denies acral numbness.     Objective:   Physical Exam VS: see vs page GEN: no distress.  Morbid obesity HEAD: head: no deformity eyes: no periorbital swelling, no proptosis external nose and ears are normal mouth: no lesion seen NECK: supple, thyroid is not enlarged CHEST WALL: no deformity LUNGS:  Clear to auscultation CV: reg rate and rhythm, no murmur ABD: abdomen is soft, nontender.  no hepatosplenomegaly.  not distended.  no hernia.  Old healed midline surgical scar.   MUSCULOSKELETAL: muscle bulk and strength are grossly normal.  no obvious joint swelling.  gait is normal and steady EXTEMITIES: no deformity.  no ulcer on the feet.  feet are of normal color and temp.  no edema PULSES: dorsalis pedis intact bilat.  no carotid bruit NEURO:  cn 2-12 grossly intact.   readily moves all 4's.  sensation is intact to touch on the feet SKIN:  Normal texture and temperature.  No rash or suspicious lesion is visible.  No striae on the abdomen.  NODES:  None palpable at the neck PSYCH: alert, well-oriented.  Does not appear anxious nor depressed.   Lab Results  Component Value Date   TSH 2.019 09/09/2014   T4TOTAL 5.1 08/18/2013   Lab Results  Component Value Date   CALCIUM 8.9 09/09/2014      Assessment & Plan:  Fatigue, new to me, uncertain etiology.  Obesity, recurrent after surgery.  Patient is advised the following: Patient Instructions  you should do a "dexamethasone suppression test."  for this, you would take dexamethasone 1 mg at 10 pm, then come in for a "cortisol" blood test the next morning before 9 am.  you do not need to be fasting for this test.   You should consider having a revision of your weight-loss surgery.   I would be happy to see you  back here as necessary.

## 2014-10-23 NOTE — Patient Instructions (Addendum)
you should do a "dexamethasone suppression test."  for this, you would take dexamethasone 1 mg at 10 pm, then come in for a "cortisol" blood test the next morning before 9 am.  you do not need to be fasting for this test.   You should consider having a revision of your weight-loss surgery.   I would be happy to see you back here as necessary.

## 2014-10-27 ENCOUNTER — Other Ambulatory Visit (INDEPENDENT_AMBULATORY_CARE_PROVIDER_SITE_OTHER): Payer: BLUE CROSS/BLUE SHIELD

## 2014-10-27 DIAGNOSIS — R635 Abnormal weight gain: Secondary | ICD-10-CM | POA: Diagnosis not present

## 2014-10-27 LAB — CORTISOL: Cortisol, Plasma: 0.5 ug/dL

## 2014-10-28 ENCOUNTER — Telehealth: Payer: Self-pay

## 2014-10-28 NOTE — Telephone Encounter (Signed)
This says that we do not find a hormonal cause of your symptoms.   Please see a pcp to see if a cause can be found.

## 2014-10-28 NOTE — Telephone Encounter (Signed)
Pt advised of note below and voiced understanding.  

## 2014-10-28 NOTE — Telephone Encounter (Signed)
Pt called back about her cortisol levels. Pt was advised the cortisol tests were normal but wanted to know if there are any other tests that could be done to determine what is going on? Please advise, Thanks!

## 2014-11-03 ENCOUNTER — Telehealth: Payer: Self-pay | Admitting: Obstetrics and Gynecology

## 2014-11-03 NOTE — Telephone Encounter (Signed)
Patient called and cancelled her 4 week recheck appointment for 11/05/14. She said, "The bariatric place that was recommended to me didn't work out. I have another place I am thinking will be fine. I'll call back to reschedule when I am ready."

## 2014-11-05 ENCOUNTER — Ambulatory Visit: Payer: BLUE CROSS/BLUE SHIELD | Admitting: Obstetrics and Gynecology

## 2014-11-24 ENCOUNTER — Other Ambulatory Visit: Payer: Self-pay | Admitting: *Deleted

## 2014-11-24 MED ORDER — BUPROPION HCL ER (XL) 300 MG PO TB24: 300.0000 mg | ORAL_TABLET | Freq: Every day | ORAL | Status: DC

## 2014-11-24 NOTE — Telephone Encounter (Signed)
Medication refill request: Wellbutrin  Last AEX:  Never Last Office Visit: 10-07-14 Next AEX: Not scheduled Last MMG (if hormonal medication request): N/A Refill authorized: please advise

## 2014-12-01 ENCOUNTER — Telehealth: Payer: Self-pay

## 2014-12-01 NOTE — Telephone Encounter (Signed)
Spoke with patient. Advised of message as seen below from [place]. Patient verbalizes understanding but declines to schedule aex at this time. Will call back when it gets closer to February to schedule.  Routing to provider for final review. Patient agreeable to disposition. Will close encounter.

## 2014-12-01 NOTE — Telephone Encounter (Signed)
-----   Message from Salvadore Dom, MD sent at 11/24/2014 11:57 AM EDT ----- I refilled the script for wellbutrin x 6 months. She should probably see Dr Sabra Heck for an annual exam in 2/17 (1 year post op). Thanks, Sharee Pimple

## 2014-12-17 ENCOUNTER — Other Ambulatory Visit: Payer: Self-pay | Admitting: General Surgery

## 2014-12-18 ENCOUNTER — Telehealth: Payer: Self-pay | Admitting: Obstetrics and Gynecology

## 2014-12-18 NOTE — Telephone Encounter (Signed)
See previous phone note from 12-01-14.  Patient calling to states [facility] says RX refill was not called in. Apologized for miscommunication; our records indicate RX was sent on 11-24-14 and received by pharmacy. [facility], per Minette Brine, RX was received but was put back to stock because patient did not pick it up. They will re-dispense now. Call back to patient. Notified RX as above.  Routing to provider for final review. Patient agreeable to disposition. Will close encounter.

## 2014-12-18 NOTE — Telephone Encounter (Signed)
Patient's wellbutrin medication was not called in and she's calling to check the status of this.

## 2014-12-22 ENCOUNTER — Other Ambulatory Visit: Payer: BLUE CROSS/BLUE SHIELD

## 2014-12-28 ENCOUNTER — Other Ambulatory Visit: Payer: Self-pay | Admitting: General Surgery

## 2014-12-28 ENCOUNTER — Ambulatory Visit
Admission: RE | Admit: 2014-12-28 | Discharge: 2014-12-28 | Disposition: A | Discharge status: Home / Self Care | Payer: BLUE CROSS/BLUE SHIELD | Source: Ambulatory Visit | Attending: General Surgery | Admitting: General Surgery

## 2015-01-05 ENCOUNTER — Encounter
Admission: RE | Admit: 2015-01-05 | Discharge: 2015-01-05 | Disposition: A | Discharge status: Home / Self Care | Payer: BLUE CROSS/BLUE SHIELD | Source: Ambulatory Visit | Attending: Otolaryngology | Admitting: Otolaryngology

## 2015-01-05 DIAGNOSIS — Z01810 Encounter for preprocedural cardiovascular examination: Secondary | ICD-10-CM | POA: Insufficient documentation

## 2015-01-05 DIAGNOSIS — Z01812 Encounter for preprocedural laboratory examination: Secondary | ICD-10-CM | POA: Diagnosis present

## 2015-01-05 LAB — CBC
HCT: 39.5 % (ref 35.0–47.0)
Hemoglobin: 12.6 g/dL (ref 12.0–16.0)
MCH: 25 pg — AB (ref 26.0–34.0)
MCHC: 32 g/dL (ref 32.0–36.0)
MCV: 78.4 fL — ABNORMAL LOW (ref 80.0–100.0)
PLATELETS: 320 10*3/uL (ref 150–440)
RBC: 5.05 MIL/uL (ref 3.80–5.20)
RDW: 15.8 % — AB (ref 11.5–14.5)
WBC: 10.4 10*3/uL (ref 3.6–11.0)

## 2015-01-05 LAB — BASIC METABOLIC PANEL
Anion gap: 5 (ref 5–15)
BUN: 16 mg/dL (ref 6–20)
CALCIUM: 9 mg/dL (ref 8.9–10.3)
CO2: 25 mmol/L (ref 22–32)
CREATININE: 0.77 mg/dL (ref 0.44–1.00)
Chloride: 107 mmol/L (ref 101–111)
GFR calc Af Amer: >60 mL/min (ref >60)
GLUCOSE: 90 mg/dL (ref 65–99)
Potassium: 4.4 mmol/L (ref 3.5–5.1)
Sodium: 137 mmol/L (ref 135–145)

## 2015-01-05 NOTE — Patient Instructions (Signed)
  Your procedure is scheduled on: Monday 01/18/2015 Report to Day Surgery. 2ND FLOOR MEDICAL MALL ENTRANCE To find out your arrival time please call [phone] between 1PM - 3PM on Friday 01/15/2015.  Remember: Instructions that are not followed completely may result in serious medical risk, up to and including death, or upon the discretion of your surgeon and anesthesiologist your surgery may need to be rescheduled.    __X__ 1. Do not eat food or drink liquids after midnight. No gum chewing or hard candies.     __X__ 2. No Alcohol for 24 hours before or after surgery.   ____ 3. Bring all medications with you on the day of surgery if instructed.    __X__ 4. Notify your doctor if there is any change in your medical condition     (cold, fever, infections).     Do not wear jewelry, make-up, hairpins, clips or nail polish.  Do not wear lotions, powders, or perfumes. You may wear deodorant.  Do not shave 48 hours prior to surgery. Men may shave face and neck.  Do not bring valuables to the hospital.    [facility] is not responsible for any belongings or valuables.               Contacts, dentures or bridgework may not be worn into surgery.  Leave your suitcase in the car. After surgery it may be brought to your room.  For patients admitted to the hospital, discharge time is determined by your                treatment team.   Patients discharged the day of surgery will not be allowed to drive home.   Please read over the following fact sheets that you were given:   Surgical Site Infection Prevention   __X_ Take these medicines the morning of surgery with A SIP OF WATER:    1. WELLBUTRIN  2.   3.   4.  5.  6.  ____ Fleet Enema (as directed)   ____ Use CHG Soap as directed  ____ Use inhalers on the day of surgery  ____ Stop metformin 2 days prior to surgery    ____ Take 1/2 of usual insulin dose the night before surgery and none on the morning of surgery.   ____ Stop  Coumadin/Plavix/aspirin on   __X__ Stop Anti-inflammatories on NO ADVIL, IBUPROFEN, MOTRIN, ALEVE   ____ Stop supplements until after surgery.    ____ Bring C-Pap to the hospital.

## 2015-01-11 ENCOUNTER — Other Ambulatory Visit: Payer: BLUE CROSS/BLUE SHIELD

## 2015-01-14 NOTE — OR Nursing (Addendum)
LBBB noted on previous ECG from 09/25/11 with office visit at that time noting presence of LBBB since 2004

## 2015-01-18 ENCOUNTER — Encounter: Admission: RE | Payer: Self-pay | Source: Ambulatory Visit

## 2015-01-18 ENCOUNTER — Ambulatory Visit: Admission: RE | Admit: 2015-01-18 | Payer: BLUE CROSS/BLUE SHIELD | Source: Ambulatory Visit | Admitting: Otolaryngology

## 2015-01-18 SURGERY — SEPTOPLASTY, NOSE, WITH NASAL TURBINATE REDUCTION
Anesthesia: Choice | Laterality: Bilateral

## 2015-02-15 ENCOUNTER — Inpatient Hospital Stay: Admission: RE | Admit: 2015-02-15 | Payer: BLUE CROSS/BLUE SHIELD | Source: Ambulatory Visit

## 2015-02-15 NOTE — Patient Instructions (Signed)
  Your procedure is scheduled on: 02-24-15 Report to [facility] To find out your arrival time please call [phone] between 1PM - 3PM on 02-23-15 (TUESDAY)  Remember: Instructions that are not followed completely may result in serious medical risk, up to and including death, or upon the discretion of your surgeon and anesthesiologist your surgery may need to be rescheduled.    _X___ 1. Do not eat food or drink liquids after midnight. No gum chewing or hard candies.     _X___ 2. No Alcohol for 24 hours before or after surgery.   ____ 3. Bring all medications with you on the day of surgery if instructed.    ____ 4. Notify your doctor if there is any change in your medical condition     (cold, fever, infections).     Do not wear jewelry, make-up, hairpins, clips or nail polish.  Do not wear lotions, powders, or perfumes. You may wear deodorant.  Do not shave 48 hours prior to surgery. Men may shave face and neck.  Do not bring valuables to the hospital.    [facility] is not responsible for any belongings or valuables.               Contacts, dentures or bridgework may not be worn into surgery.  Leave your suitcase in the car. After surgery it may be brought to your room.  For patients admitted to the hospital, discharge time is determined by your treatment team.   Patients discharged the day of surgery will not be allowed to drive home.   Please read over the following fact sheets that you were given:     _X___ Take these medicines the morning of surgery with A SIP OF WATER:    1. WELLBUTRIN  2.   3.   4.  5.  6.  ____ Fleet Enema (as directed)   ____ Use CHG Soap as directed  ____ Use inhalers on the day of surgery  ____ Stop metformin 2 days prior to surgery    ____ Take 1/2 of usual insulin dose the night before surgery and none on the morning of surgery.   ____ Stop Coumadin/Plavix/aspirin-N/A  ____ Stop Anti-inflammatories -NO  NSAIDS OR ASA PRODUCTS-TYLENOL OK   ____ Stop supplements until after surgery.    ____ Bring C-Pap to the hospital.

## 2015-02-24 ENCOUNTER — Encounter
Admission: RE | Disposition: A | Discharge status: Home / Self Care | Payer: Self-pay | Source: Ambulatory Visit | Attending: Otolaryngology

## 2015-02-24 ENCOUNTER — Encounter: Payer: Self-pay | Admitting: Anesthesiology

## 2015-02-24 ENCOUNTER — Ambulatory Visit: Payer: BLUE CROSS/BLUE SHIELD | Admitting: Certified Registered"

## 2015-02-24 ENCOUNTER — Ambulatory Visit
Admission: RE | Admit: 2015-02-24 | Discharge: 2015-02-24 | Disposition: A | Discharge status: Home / Self Care | Payer: BLUE CROSS/BLUE SHIELD | Source: Ambulatory Visit | Attending: Otolaryngology | Admitting: Otolaryngology

## 2015-02-24 DIAGNOSIS — Z809 Family history of malignant neoplasm, unspecified: Secondary | ICD-10-CM | POA: Diagnosis not present

## 2015-02-24 DIAGNOSIS — J342 Deviated nasal septum: Secondary | ICD-10-CM | POA: Diagnosis not present

## 2015-02-24 DIAGNOSIS — Z79899 Other long term (current) drug therapy: Secondary | ICD-10-CM | POA: Insufficient documentation

## 2015-02-24 DIAGNOSIS — J343 Hypertrophy of nasal turbinates: Secondary | ICD-10-CM | POA: Insufficient documentation

## 2015-02-24 DIAGNOSIS — E669 Obesity, unspecified: Secondary | ICD-10-CM | POA: Insufficient documentation

## 2015-02-24 DIAGNOSIS — Z8249 Family history of ischemic heart disease and other diseases of the circulatory system: Secondary | ICD-10-CM | POA: Insufficient documentation

## 2015-02-24 DIAGNOSIS — Z8261 Family history of arthritis: Secondary | ICD-10-CM | POA: Diagnosis not present

## 2015-02-24 DIAGNOSIS — Z90710 Acquired absence of both cervix and uterus: Secondary | ICD-10-CM | POA: Diagnosis not present

## 2015-02-24 DIAGNOSIS — Z9884 Bariatric surgery status: Secondary | ICD-10-CM | POA: Diagnosis not present

## 2015-02-24 DIAGNOSIS — Z6841 Body Mass Index (BMI) 40.0 and over, adult: Secondary | ICD-10-CM | POA: Diagnosis not present

## 2015-02-24 DIAGNOSIS — D649 Anemia, unspecified: Secondary | ICD-10-CM | POA: Insufficient documentation

## 2015-02-24 HISTORY — PX: NASAL SEPTOPLASTY W/ TURBINOPLASTY: SHX2070

## 2015-02-24 SURGERY — SEPTOPLASTY, NOSE, WITH NASAL TURBINATE REDUCTION
Anesthesia: General | Laterality: Bilateral

## 2015-02-24 MED ORDER — FENTANYL CITRATE (PF) 100 MCG/2ML IJ SOLN
INTRAMUSCULAR | Status: AC
  Filled 2015-02-24: qty 2

## 2015-02-24 MED ORDER — PROMETHAZINE HCL 25 MG/ML IJ SOLN
6.2500 mg | Freq: Once | INTRAMUSCULAR | Status: AC
  Administered 2015-02-24: 6.25 mg via INTRAVENOUS

## 2015-02-24 MED ORDER — LIDOCAINE-EPINEPHRINE (PF) 1 %-1:200000 IJ SOLN
INTRAMUSCULAR | Status: AC
  Filled 2015-02-24: qty 30

## 2015-02-24 MED ORDER — FENTANYL CITRATE (PF) 100 MCG/2ML IJ SOLN
INTRAMUSCULAR | Status: DC | PRN
  Administered 2015-02-24 (×2): 50 ug via INTRAVENOUS
  Administered 2015-02-24: 25 ug via INTRAVENOUS

## 2015-02-24 MED ORDER — ONDANSETRON HCL 4 MG/2ML IJ SOLN
4.0000 mg | Freq: Once | INTRAMUSCULAR | Status: AC | PRN
  Administered 2015-02-24: 4 mg via INTRAVENOUS

## 2015-02-24 MED ORDER — LIDOCAINE HCL (CARDIAC) 20 MG/ML IV SOLN
INTRAVENOUS | Status: DC | PRN
Start: 2015-02-24 — End: 2015-02-24
  Administered 2015-02-24: 100 mg via INTRAVENOUS

## 2015-02-24 MED ORDER — OXYMETAZOLINE HCL 0.05 % NA SOLN
NASAL | Status: AC
  Filled 2015-02-24: qty 15

## 2015-02-24 MED ORDER — FAMOTIDINE 20 MG PO TABS
ORAL_TABLET | ORAL | Status: AC
  Filled 2015-02-24: qty 1

## 2015-02-24 MED ORDER — SUGAMMADEX SODIUM 500 MG/5ML IV SOLN
INTRAVENOUS | Status: DC | PRN
  Administered 2015-02-24: 350 mg via INTRAVENOUS

## 2015-02-24 MED ORDER — FAMOTIDINE 20 MG PO TABS
20.0000 mg | ORAL_TABLET | Freq: Once | ORAL | Status: AC
  Administered 2015-02-24: 20 mg via ORAL

## 2015-02-24 MED ORDER — OXYMETAZOLINE HCL 0.05 % NA SOLN
2.0000 | Freq: Once | NASAL | Status: AC
  Administered 2015-02-24: 2 via NASAL

## 2015-02-24 MED ORDER — MIDAZOLAM HCL 2 MG/2ML IJ SOLN
INTRAMUSCULAR | Status: DC | PRN
  Administered 2015-02-24: 2 mg via INTRAVENOUS

## 2015-02-24 MED ORDER — PROPOFOL 10 MG/ML IV BOLUS
INTRAVENOUS | Status: DC | PRN
  Administered 2015-02-24: 200 mg via INTRAVENOUS

## 2015-02-24 MED ORDER — CEFAZOLIN SODIUM-DEXTROSE 2-3 GM-% IV SOLR
2.0000 g | Freq: Once | INTRAVENOUS | Status: AC
Start: 2015-02-24 — End: 2015-02-24
  Administered 2015-02-24: 2 g via INTRAVENOUS

## 2015-02-24 MED ORDER — SUCCINYLCHOLINE CHLORIDE 20 MG/ML IJ SOLN
INTRAMUSCULAR | Status: DC | PRN
  Administered 2015-02-24: 100 mg via INTRAVENOUS

## 2015-02-24 MED ORDER — ESMOLOL HCL 100 MG/10ML IV SOLN
INTRAVENOUS | Status: DC | PRN
  Administered 2015-02-24 (×3): 10 mg via INTRAVENOUS

## 2015-02-24 MED ORDER — CEFAZOLIN SODIUM-DEXTROSE 2-3 GM-% IV SOLR
INTRAVENOUS | Status: AC
  Filled 2015-02-24: qty 50

## 2015-02-24 MED ORDER — METOPROLOL TARTRATE 1 MG/ML IV SOLN
INTRAVENOUS | Status: DC | PRN
  Administered 2015-02-24: 3 mg via INTRAVENOUS

## 2015-02-24 MED ORDER — LIDOCAINE HCL 4 % EX SOLN
CUTANEOUS | Status: DC | PRN
  Administered 2015-02-24: 21 mL via TOPICAL

## 2015-02-24 MED ORDER — FENTANYL CITRATE (PF) 100 MCG/2ML IJ SOLN
25.0000 ug | INTRAMUSCULAR | Status: AC | PRN
  Administered 2015-02-24 (×6): 25 ug via INTRAVENOUS

## 2015-02-24 MED ORDER — PROMETHAZINE HCL 25 MG/ML IJ SOLN
INTRAMUSCULAR | Status: AC
  Filled 2015-02-24: qty 1

## 2015-02-24 MED ORDER — ONDANSETRON HCL 4 MG/2ML IJ SOLN
INTRAMUSCULAR | Status: AC
  Filled 2015-02-24: qty 2

## 2015-02-24 MED ORDER — DEXAMETHASONE SODIUM PHOSPHATE 10 MG/ML IJ SOLN
INTRAMUSCULAR | Status: DC | PRN
  Administered 2015-02-24: 10 mg via INTRAVENOUS

## 2015-02-24 MED ORDER — LIDOCAINE-EPINEPHRINE (PF) 1 %-1:200000 IJ SOLN
INTRAMUSCULAR | Status: DC | PRN
  Administered 2015-02-24: 8 mL

## 2015-02-24 MED ORDER — ROCURONIUM BROMIDE 100 MG/10ML IV SOLN
INTRAVENOUS | Status: DC | PRN
  Administered 2015-02-24: 10 mg via INTRAVENOUS
  Administered 2015-02-24: 5 mg via INTRAVENOUS
  Administered 2015-02-24: 10 mg via INTRAVENOUS
  Administered 2015-02-24: 25 mg via INTRAVENOUS

## 2015-02-24 MED ORDER — PHENYLEPHRINE HCL 10 % OP SOLN: Freq: Once | OPHTHALMIC | Status: DC

## 2015-02-24 MED ORDER — BACITRACIN ZINC 500 UNIT/GM EX OINT
TOPICAL_OINTMENT | CUTANEOUS | Status: AC
  Filled 2015-02-24: qty 28.35

## 2015-02-24 MED ORDER — LACTATED RINGERS IV SOLN
INTRAVENOUS | Status: DC
  Administered 2015-02-24: 10:00:00 via INTRAVENOUS

## 2015-02-24 MED ORDER — ONDANSETRON HCL 4 MG/2ML IJ SOLN
INTRAMUSCULAR | Status: DC | PRN
  Administered 2015-02-24: 4 mg via INTRAVENOUS

## 2015-02-24 SURGICAL SUPPLY — 25 items
BLADE SURG 15 STRL LF DISP TIS (BLADE) ×1 IMPLANT
BLADE SURG 15 STRL SS (BLADE) ×2
CANISTER SUCT 1200ML W/VALVE (MISCELLANEOUS) ×3 IMPLANT
COAG SUCT 10F 3.5MM HAND CTRL (MISCELLANEOUS) ×3 IMPLANT
COAGULATOR SUCT 8FR VV (MISCELLANEOUS) ×3 IMPLANT
DRSG NASAL 4CM NASOPORE (MISCELLANEOUS) ×3 IMPLANT
GLOVE EXAM LX STRL 7.5 (GLOVE) ×3 IMPLANT
GOWN STRL REUS W/ TWL LRG LVL3 (GOWN DISPOSABLE) ×2 IMPLANT
GOWN STRL REUS W/TWL LRG LVL3 (GOWN DISPOSABLE) ×4
LABEL OR SOLS (LABEL) ×3 IMPLANT
NEEDLE SPNL 25GX3.5 QUINCKE BL (NEEDLE) ×3 IMPLANT
NS IRRIG 500ML POUR BTL (IV SOLUTION) ×3 IMPLANT
PACK HEAD/NECK (MISCELLANEOUS) ×3 IMPLANT
PACKING NASAL EPIS 4X2.4 XEROG (MISCELLANEOUS) ×3 IMPLANT
PAD GROUND ADULT SPLIT (MISCELLANEOUS) ×3 IMPLANT
PATTIES SURGICAL .5 X3 (DISPOSABLE) ×3 IMPLANT
SOL ANTI-FOG 6CC FOG-OUT (MISCELLANEOUS) ×1 IMPLANT
SOL FOG-OUT ANTI-FOG 6CC (MISCELLANEOUS) ×2
SPLINT NASAL REUTER (MISCELLANEOUS) ×3 IMPLANT
SUT CHROMIC 3-0 (SUTURE) ×4
SUT CHROMIC 3-0 KS 27XMFL CR (SUTURE) ×2
SUT ETHILON 3-0 KS 30 BLK (SUTURE) ×3 IMPLANT
SUT PLAIN GUT 4-0 (SUTURE) ×3 IMPLANT
SUTURE CHRMC 3-0 KS 27XMFL CR (SUTURE) ×2 IMPLANT
SYR 3ML LL SCALE MARK (SYRINGE) ×3 IMPLANT

## 2015-02-24 NOTE — Op Note (Signed)
02/24/2015  12:48 PM  [id number]   Pre-Op Dx:  Deviated Nasal Septum, Hypertrophic Inferior Turbinates  Post-op Dx: Same  Proc: Nasal Septoplasty, Bilateral Partial Reduction Inferior Turbinates,  endoscopic reduction of the right middle turbinate  Surg:  Jamespaul Secrist [name]  Anes:  GOT  EBL:  500 mL  Comp:  None  Findings: The patient had a very deviated septum to her left side, including the ethmoid plate and the vomer spur. The right middle turbinate was markedly enlarged. Both inferior turbinates were enlarged.  Procedure: With the patient in a comfortable supine position,  general orotracheal anesthesia was induced without difficulty.     The patient received preoperative Afrin spray for topical decongestion and vasoconstriction.  Intravenous prophylactic antibiotics were administered.  At an appropriate level, the patient was placed in a semi-sitting position.  Nasal vibrissae were trimmed.   1% Xylocaine with 1:100,000 epinephrine, 10 cc's, was infiltrated into the anterior floor of the nose, into the nasal spine region, into the membranous columella, and finally into the submucoperichondrial plane of the septum on both sides.  Several minutes were allowed for this to take effect.  Cottoniod pledgetts soaked in Afrin and 4% Xylocaine were placed into both nasal cavities and left while the patient was prepped and draped in the standard fashion.  The materials were removed from the nose and observed to be intact and correct in number.  The nose was inspected with a headlight with the findings as described above.  A left hemitransfixion incision was sharply executed and carried down to the caudal edge of the quadrangular cartilage. The mucoperichondrium was elelvated along the quadrangular plate back to the bony-cartilaginous junction. The mucoperiostium was then elevated along the ethmoid plate and the vomer. The boney-catilaginous junction was then split with a freer elevator and the  mucoperiosteum was elevated on the opposite side. The mucoperiosteum was then elevated along the maxillary crest as needed to expose the crooked bone of the crest.  Boney spurs of the vomer and maxillary crest were removed with Donavan Foil forceps. The periosteum was elevated on the right side of the quadrangular plate. It was freed up inferiorly as it was pulled to the left side. A morselizer was used to break the spring of the cartilage to rule out fall back to the middle.  The cartilaginous plate was trimmed along its posterior and inferior borders of about 2 mm of cartilage to free it up inferiorly. Some of the deviated ethmoid plate was then fractured and removed with Takahashi forceps to free up the posterior border of the quadrangular plate and allow it to swing back to the midline. The mucosal flaps were placed back into their anatomic position to allow visualization of the airways. The septum now sat in the midline with an improved airway.  A 3-0 Chromic suture on a Keith needle in used to anchor the inferior septum at the nasal spine with a through and through suture. The mucosal flaps are then sutured together using a through and through whip stitch of 4-0 Plain Gut with a mini-Keith needle. This was used to close the [place] incision as well.   The inferior turbinates were then inspected. An incision was created along the inferior aspect of the left inferior turbinate with removal of some of the inferior soft tissue and bone. Electrocautery was used to control bleeding in the area. The remaining turbinate was then outfractured to open up the airway further. There was no significant bleeding noted. The right turbinate was then  trimmed and outfractured in a similar fashion.  The 0 scope was used to visualize turbinates. The right middle turbinate was markedly enlarged. The left middle turbinate was thinner and had been pushed to the left. The right middle turbinate was trimmed along its anterior  inferior border using the Gruenwald forceps. There was a conchal bullosa that was opened and tissue removed. Cautery was used along the stump of the middle turbinate to help control bleeding. So open airway significantly on the right side and the correct in the deviated septum open up the airway more on the left side.  The airways were then visualized and showed open passageways on both sides that were significantly improved compared to before surgery. There was no signifcant bleeding. Nasal splints were applied to both sides of the septum using Xomed 0.32mm regular sized splints that were trimmed, and then held in position with a 3-0 Nylon through and through suture. Xerogel was then placed over the middle turbinate stump on the right side to provide a dressing.  The patient was turned back over to anesthesia, and awakened, extubated, and taken to the PACU in satisfactory condition.  Dispo:   PACU to home  Plan: Ice, elevation, narcotic analgesia, steroid taper, and prophylactic antibiotics for the duration of indwelling nasal foreign bodies.  We will reevaluate the patient in the office in 6 days and remove the septal splints.  Return to work in 10 days, strenuous activities in two weeks.   [name] 02/24/2015 12:48 PM

## 2015-02-24 NOTE — Anesthesia Postprocedure Evaluation (Signed)
Anesthesia Post Note  Patient: [name]  Procedure(s) Performed: Procedure(s) (LRB): NASAL SEPTOPLASTY WITH endoscopic bilateral middle turbinate  and bilateral partial inferior turbinate reduction (Bilateral)  Patient location during evaluation: PACU Anesthesia Type: General Level of consciousness: awake Pain management: pain level controlled Vital Signs Assessment: post-procedure vital signs reviewed and stable Respiratory status: spontaneous breathing Cardiovascular status: blood pressure returned to baseline Anesthetic complications: no    Last Vitals:  Filed Vitals:   02/24/15 1519 02/24/15 1548  BP: 157/80 156/75  Pulse: 78 73  Temp: 36.6 C   Resp: 14     Last Pain:  Filed Vitals:   02/24/15 1620  PainSc: [place]

## 2015-02-24 NOTE — H&P (Signed)
  H&P has been reviewed and no changes necessary. To be downloaded later. 

## 2015-02-24 NOTE — Transfer of Care (Signed)
Immediate Anesthesia Transfer of Care Note  Patient: [name]  Procedure(s) Performed: Procedure(s): NASAL SEPTOPLASTY WITH endoscopic bilateral middle turbinate  and bilateral partial inferior turbinate reduction (Bilateral)  Patient Location: PACU  Anesthesia Type:General  Level of Consciousness: awake, alert , oriented and patient cooperative  Airway & Oxygen Therapy: Patient Spontanous Breathing and Patient connected to face mask oxygen  Post-op Assessment: Report given to RN, Post -op Vital signs reviewed and stable and Patient moving all extremities X 4  Post vital signs: Reviewed and stable  Last Vitals:  Filed Vitals:   02/24/15 1005 02/24/15 1300  BP: 156/114 133/74  Pulse: 81 87  Temp: 36.7 C 36.3 C  Resp: 18 18    Complications: No apparent anesthesia complications

## 2015-02-24 NOTE — Discharge Instructions (Addendum)
AMBULATORY SURGERY  °DISCHARGE INSTRUCTIONS ° ° °1) The drugs that you were given will stay in your system until tomorrow so for the next 24 hours you should not: ° °A) Drive an automobile °B) Make any legal decisions °C) Drink any alcoholic beverage ° ° °2) You may resume regular meals tomorrow.  Today it is better to start with liquids and gradually work up to solid foods. ° °You may eat anything you prefer, but it is better to start with liquids, then soup and crackers, and gradually work up to solid foods. ° ° °3) Please notify your doctor immediately if you have any unusual bleeding, trouble breathing, redness and pain at the surgery site, drainage, fever, or pain not relieved by medication. ° ° ° °4) Additional Instructions: ° ° ° ° ° ° ° °Please contact your physician with any problems or Same Day Surgery at [phone], Monday through Friday 6 am to 4 pm, or [place] [facility] [place] Main number at [phone].AMBULATORY SURGERY  °DISCHARGE INSTRUCTIONS ° ° °5) The drugs that you were given will stay in your system until tomorrow so for the next 24 hours you should not: ° °D) Drive an automobile °E) Make any legal decisions °F) Drink any alcoholic beverage ° ° °6) You may resume regular meals tomorrow.  Today it is better to start with liquids and gradually work up to solid foods. ° °You may eat anything you prefer, but it is better to start with liquids, then soup and crackers, and gradually work up to solid foods. ° ° °7) Please notify your doctor immediately if you have any unusual bleeding, trouble breathing, redness and pain at the surgery site, drainage, fever, or pain not relieved by medication. ° ° ° °8) Additional Instructions: ° ° ° ° ° ° ° °Please contact your physician with any problems or Same Day Surgery at [phone], Monday through Friday 6 am to 4 pm, or [place] [facility] [place] Main number at [phone]. °

## 2015-02-24 NOTE — Anesthesia Procedure Notes (Signed)
Procedure Name: Intubation Date/Time: 02/24/2015 10:53 AM Performed by: Silvana Newness Pre-anesthesia Checklist: Patient identified, Emergency Drugs available, Suction available, Patient being monitored and Timeout performed Patient Re-evaluated:Patient Re-evaluated prior to inductionOxygen Delivery Method: Circle system utilized Preoxygenation: Pre-oxygenation with 100% oxygen Intubation Type: IV induction Ventilation: Mask ventilation without difficulty Laryngoscope Size: 3 and McGraph Grade View: Grade I Tube type: Oral Rae Tube size: 7.0 mm Number of attempts: 1 Airway Equipment and Method: Rigid stylet (ramped patient on blankets) Placement Confirmation: ETT inserted through vocal cords under direct vision,  positive ETCO2 and breath sounds checked- equal and bilateral Secured at: 19 cm Tube secured with: Tape Dental Injury: Teeth and Oropharynx as per pre-operative assessment  Comments: Patient morbidly obese, ramped patient on blankets and electively used mcgraph 3. No problems with intubation

## 2015-02-24 NOTE — Progress Notes (Signed)
Dr Kathyrn Sheriff notified re knee high stockings not in hospital stock for pt's leg measurements, SCDS applied as ordered, knee high stocking order discontinued, order for SCDS entered and device/wraps applied to patient prior to OR transport

## 2015-02-24 NOTE — Anesthesia Preprocedure Evaluation (Addendum)
Anesthesia Evaluation  Patient identified by MRN, date of birth, ID band Patient awake    Reviewed: Allergy & Precautions, NPO status , Patient's Chart, lab work & pertinent test results, reviewed documented beta blocker date and time   History of Anesthesia Complications (+) PONV and history of anesthetic complications  Airway Mallampati: III  TM Distance: >3 FB     Dental  (+) Chipped   Pulmonary           Cardiovascular hypertension, + dysrhythmias      Neuro/Psych PSYCHIATRIC DISORDERS Anxiety Depression    GI/Hepatic   Endo/Other  Morbid obesity  Renal/GU      Musculoskeletal   Abdominal   Peds  Hematology  (+) anemia ,   Anesthesia Other Findings Ekg noted. Pt has had Lbbb since birth.  Reproductive/Obstetrics                            Anesthesia Physical Anesthesia Plan  ASA: III  Anesthesia Plan: General   Post-op Pain Management:    Induction: Intravenous  Airway Management Planned: Oral ETT  Additional Equipment:   Intra-op Plan:   Post-operative Plan:   Informed Consent: I have reviewed the patients History and Physical, chart, labs and discussed the procedure including the risks, benefits and alternatives for the proposed anesthesia with the patient or authorized representative who has indicated his/her understanding and acceptance.     Plan Discussed with: CRNA  Anesthesia Plan Comments:         Anesthesia Quick Evaluation

## 2015-05-18 ENCOUNTER — Telehealth: Payer: Self-pay | Admitting: Oncology

## 2015-05-18 ENCOUNTER — Telehealth: Payer: Self-pay | Admitting: *Deleted

## 2015-05-18 ENCOUNTER — Other Ambulatory Visit: Payer: Self-pay | Admitting: Oncology

## 2015-05-18 ENCOUNTER — Telehealth: Payer: Self-pay

## 2015-05-18 DIAGNOSIS — D509 Iron deficiency anemia, unspecified: Secondary | ICD-10-CM

## 2015-05-18 NOTE — Telephone Encounter (Signed)
Pt states Dr Alen Blew said to call if her anemia symptoms come back. They have come back.  Hair loss, dizzyness, leg cramps, extreme fatigue, cannot sleep well, worn out from ADL. It has been worsening for a couple of weeks, she was getting over the flu and thought that was the reason. She is requesting blood check. Afternoon appts would be better so she can arrange child care.

## 2015-05-18 NOTE — Telephone Encounter (Signed)
Per staff phone call and POF I have schedueld appts. Scheduler advised of appts.  JMW  

## 2015-05-18 NOTE — Telephone Encounter (Signed)
POF sent to schedulers.  

## 2015-05-18 NOTE — Telephone Encounter (Signed)
per pof to sch pt appt-MW sch fera-cld & left pt a message and gave time & date of appt for 3/22-adv to get updated copy of avs after labs

## 2015-05-19 ENCOUNTER — Other Ambulatory Visit: Payer: BLUE CROSS/BLUE SHIELD

## 2015-05-21 ENCOUNTER — Telehealth: Payer: Self-pay

## 2015-05-21 NOTE — Telephone Encounter (Signed)
Pt called stating she has not heard back from this office. Noted that a voice message was left for pt on 3/21 for a lab appt on 3/22 with feraheme appt on 3/28 and 4/4. Pt did not have labs done on 3/22 so she probably did not get the message.   Called pt back, they are moving and the home phone is packed in a box somewhere. Assuming the message was left on home phone. POF sent for lab 3/27 afternoon and feraheme 3/28 or 3/29 afternoon.

## 2015-05-24 ENCOUNTER — Ambulatory Visit (HOSPITAL_BASED_OUTPATIENT_CLINIC_OR_DEPARTMENT_OTHER): Payer: BLUE CROSS/BLUE SHIELD

## 2015-05-24 DIAGNOSIS — D509 Iron deficiency anemia, unspecified: Secondary | ICD-10-CM

## 2015-05-24 LAB — CBC WITH DIFFERENTIAL/PLATELET
BASO%: 0.4 % (ref 0.0–2.0)
BASOS ABS: 0 10*3/uL (ref 0.0–0.1)
EOS ABS: 0.1 10*3/uL (ref 0.0–0.5)
EOS%: 2 % (ref 0.0–7.0)
HEMATOCRIT: 35.4 % (ref 34.8–46.6)
HEMOGLOBIN: 11.4 g/dL — AB (ref 11.6–15.9)
LYMPH#: 1.7 10*3/uL (ref 0.9–3.3)
LYMPH%: 28.8 % (ref 14.0–49.7)
MCH: 23.4 pg — AB (ref 25.1–34.0)
MCHC: 32.1 g/dL (ref 31.5–36.0)
MCV: 73 fL — ABNORMAL LOW (ref 79.5–101.0)
MONO#: 0.4 10*3/uL (ref 0.1–0.9)
MONO%: 7.3 % (ref 0.0–14.0)
NEUT#: 3.6 10*3/uL (ref 1.5–6.5)
NEUT%: 61.5 % (ref 38.4–76.8)
PLATELETS: 303 10*3/uL (ref 145–400)
RBC: 4.86 10*6/uL (ref 3.70–5.45)
RDW: 15.4 % — AB (ref 11.2–14.5)
WBC: 5.9 10*3/uL (ref 3.9–10.3)

## 2015-05-24 LAB — IRON AND TIBC
%SAT: 11 % — ABNORMAL LOW (ref 21–57)
Iron: 49 ug/dL (ref 41–142)
TIBC: 439 ug/dL (ref 236–444)
UIBC: 390 ug/dL — ABNORMAL HIGH (ref 120–384)

## 2015-05-24 LAB — FERRITIN: FERRITIN: 8 ng/mL — AB (ref 9–269)

## 2015-05-25 ENCOUNTER — Other Ambulatory Visit: Payer: Self-pay | Admitting: Oncology

## 2015-05-25 ENCOUNTER — Ambulatory Visit (HOSPITAL_BASED_OUTPATIENT_CLINIC_OR_DEPARTMENT_OTHER): Payer: BLUE CROSS/BLUE SHIELD

## 2015-05-25 VITALS — BP 139/83 | HR 77 | Temp 97.0°F | Resp 16

## 2015-05-25 DIAGNOSIS — D649 Anemia, unspecified: Secondary | ICD-10-CM | POA: Diagnosis not present

## 2015-05-25 MED ORDER — SODIUM CHLORIDE 0.9 % IV SOLN
Freq: Once | INTRAVENOUS | Status: AC
  Administered 2015-05-25: 09:00:00 via INTRAVENOUS

## 2015-05-25 MED ORDER — SODIUM CHLORIDE 0.9 % IV SOLN
510.0000 mg | Freq: Once | INTRAVENOUS | Status: AC
  Administered 2015-05-25: 510 mg via INTRAVENOUS
  Filled 2015-05-25: qty 17

## 2015-05-25 NOTE — Patient Instructions (Signed)

## 2015-05-25 NOTE — Progress Notes (Signed)
Received alert that treatment needed prior authorization. Mariana Kaufman, RN spoke with Carmelina Noun who advised to proceed with treatment.

## 2015-06-01 ENCOUNTER — Ambulatory Visit (HOSPITAL_BASED_OUTPATIENT_CLINIC_OR_DEPARTMENT_OTHER): Payer: BLUE CROSS/BLUE SHIELD

## 2015-06-01 VITALS — BP 145/71 | HR 89 | Temp 98.6°F | Resp 20

## 2015-06-01 DIAGNOSIS — D649 Anemia, unspecified: Secondary | ICD-10-CM

## 2015-06-01 MED ORDER — SODIUM CHLORIDE 0.9 % IV SOLN
510.0000 mg | Freq: Once | INTRAVENOUS | Status: AC
  Administered 2015-06-01: 510 mg via INTRAVENOUS
  Filled 2015-06-01: qty 17

## 2015-06-01 MED ORDER — SODIUM CHLORIDE 0.9 % IV SOLN
Freq: Once | INTRAVENOUS | Status: AC
  Administered 2015-06-01: 10:00:00 via INTRAVENOUS

## 2015-06-01 NOTE — Progress Notes (Signed)
Patient declined AVS 

## 2015-06-01 NOTE — Patient Instructions (Signed)

## 2015-06-14 ENCOUNTER — Other Ambulatory Visit: Payer: Self-pay | Admitting: Obstetrics and Gynecology

## 2015-06-14 NOTE — Telephone Encounter (Signed)
Medication refill request: Wellbutrin  Last AEX:  ? Last OV: 10-07-14 Next AEX: not scheduled  Last MMG (if hormonal medication request): N/A Refill authorized: please advise

## 2015-06-14 NOTE — Telephone Encounter (Signed)
I've signed the order for the Wellbutrin 300 mg, 90 with 1 refill. Please set her up for an annual exam in 7/17. Thanks.

## 2015-07-06 ENCOUNTER — Telehealth: Payer: Self-pay | Admitting: *Deleted

## 2015-07-06 ENCOUNTER — Other Ambulatory Visit: Payer: Self-pay | Admitting: *Deleted

## 2015-07-06 DIAGNOSIS — D649 Anemia, unspecified: Secondary | ICD-10-CM

## 2015-07-06 NOTE — Telephone Encounter (Signed)
Received voice mail message from patient requesting an iron infusion. Return number is [phone].

## 2015-07-06 NOTE — Telephone Encounter (Signed)
She needs a follow up with APP [facility]) next available with labs including cbc, iron studies and ferritin.

## 2015-07-12 ENCOUNTER — Telehealth: Payer: Self-pay | Admitting: *Deleted

## 2015-07-12 ENCOUNTER — Encounter: Payer: Self-pay | Admitting: Oncology

## 2015-07-12 ENCOUNTER — Telehealth: Payer: Self-pay | Admitting: Oncology

## 2015-07-12 ENCOUNTER — Other Ambulatory Visit (HOSPITAL_BASED_OUTPATIENT_CLINIC_OR_DEPARTMENT_OTHER): Payer: BLUE CROSS/BLUE SHIELD

## 2015-07-12 ENCOUNTER — Ambulatory Visit (HOSPITAL_BASED_OUTPATIENT_CLINIC_OR_DEPARTMENT_OTHER): Payer: BLUE CROSS/BLUE SHIELD | Admitting: Oncology

## 2015-07-12 VITALS — BP 148/72 | HR 87 | Temp 98.2°F | Resp 20 | Ht 64.0 in | Wt 366.1 lb

## 2015-07-12 DIAGNOSIS — D509 Iron deficiency anemia, unspecified: Secondary | ICD-10-CM

## 2015-07-12 DIAGNOSIS — D649 Anemia, unspecified: Secondary | ICD-10-CM

## 2015-07-12 LAB — IRON AND TIBC
%SAT: 29 % (ref 21–57)
IRON: 86 ug/dL (ref 41–142)
TIBC: 300 ug/dL (ref 236–444)
UIBC: 214 ug/dL (ref 120–384)

## 2015-07-12 LAB — CBC WITH DIFFERENTIAL/PLATELET
BASO%: 0.3 % (ref 0.0–2.0)
Basophils Absolute: 0 10*3/uL (ref 0.0–0.1)
EOS%: 3 % (ref 0.0–7.0)
Eosinophils Absolute: 0.2 10*3/uL (ref 0.0–0.5)
HCT: 40.6 % (ref 34.8–46.6)
HEMOGLOBIN: 13.4 g/dL (ref 11.6–15.9)
LYMPH%: 27.5 % (ref 14.0–49.7)
MCH: 26.6 pg (ref 25.1–34.0)
MCHC: 33 g/dL (ref 31.5–36.0)
MCV: 80.7 fL (ref 79.5–101.0)
MONO#: 0.4 10*3/uL (ref 0.1–0.9)
MONO%: 6.3 % (ref 0.0–14.0)
NEUT#: 3.8 10*3/uL (ref 1.5–6.5)
NEUT%: 62.9 % (ref 38.4–76.8)
Platelets: 275 10*3/uL (ref 145–400)
RBC: 5.03 10*6/uL (ref 3.70–5.45)
RDW: 19.9 % — AB (ref 11.2–14.5)
WBC: 6 10*3/uL (ref 3.9–10.3)
lymph#: 1.7 10*3/uL (ref 0.9–3.3)

## 2015-07-12 LAB — FERRITIN: FERRITIN: 53 ng/mL (ref 9–269)

## 2015-07-12 NOTE — Telephone Encounter (Signed)
As noted below by Erasmo Downer, NP, I left a message on her cell phone regarding lab results.

## 2015-07-12 NOTE — Progress Notes (Signed)
Hematology and Oncology Follow Up Visit  [name] [id number] August 13, 1975 40 y.o. 07/12/2015 1:03 PM  Principle Diagnosis: 40 year old woman with iron deficiency anemia likely due to menorrhagia and gastric bypass surgery in the past first evaluated in November of 2014.   Prior Therapy: She is status post Feraheme IV infusion on 01/10/2013 that was repeated on 03/07/2013, 05/25/15, and 06/01/15. She is status post hysterectomy in February 2015 due to menorrhagia.  Current therapy: Currently on oral iron supplements as a part of multivitamin.  Interim History: [name] presents today for a followup visit. She most recently received IV Feraheme in March and April 2017. Tolerated the infusion well without any side effects. Reports that she still having a lot of fatigue. She also reports that she is having a lot of hair falling out and thinks that she is more IV iron. She underwent hysterectomy in February of 2015 and since that have not had any bleeding. She has not reported any chest pain shortness of breath or difficulty breathing. She is not reporting any GI bleeding or GU bleeding at this time. She does not report any headaches or blurry vision. Did not report any syncope or seizures. She does not report any nausea or vomiting or change in her bowel habits. She does not report any lymphadenopathy or skin rashes. She does not report any petechiae or bleeding. Rest of her review of systems unremarkable.  Medications: I have reviewed the patient's current medications.  Current Outpatient Prescriptions  Medication Sig Dispense Refill  . buPROPion (WELLBUTRIN XL) 300 MG 24 hr tablet TAKE 1 TABLET (300 MG TOTAL) BY MOUTH DAILY. 90 tablet 1   No current facility-administered medications for this visit.     Allergies:  Allergies  Allergen Reactions  . Adhesive [Tape] Hives  . Amoxicillin Hives    Childhood allergy. Pt. Can tolerate Cephalosporins.    Past Medical History, Surgical history,  Social history, and Family History were reviewed and updated.   Physical Exam: Blood pressure 148/72, pulse 87, temperature 98.2 F (36.8 C), temperature source Oral, resp. rate 20, height 5\' 4"  (1.626 m), weight 366 lb 1.6 oz (166.062 kg), last menstrual period 02/28/2013, SpO2 100 %. ECOG: 0 General appearance: alert, cooperative and appears stated age Head: Normocephalic, without obvious abnormality, atraumatic Neck: no adenopathy Lymph nodes: Cervical, supraclavicular, and axillary nodes normal. Heart:regular rate and rhythm, S1, S2 normal, no murmur, click, rub or gallop Lung:chest clear, no wheezing, rales, normal symmetric air entry Abdomen: soft, non-tender, without masses or organomegaly EXT:no erythema, induration, or nodules   Lab Results: Lab Results  Component Value Date   WBC 6.0 07/12/2015   HGB 13.4 07/12/2015   HCT 40.6 07/12/2015   MCV 80.7 07/12/2015   PLT 275 07/12/2015     Chemistry      Component Value Date/Time   NA 137 01/05/2015 1548   NA 141 01/12/2014 0918   K 4.4 01/05/2015 1548   K 4.1 01/12/2014 0918   CL 107 01/05/2015 1548   CO2 25 01/05/2015 1548   CO2 26 01/12/2014 0918   BUN 16 01/05/2015 1548   BUN 11.9 01/12/2014 0918   CREATININE 0.77 01/05/2015 1548   CREATININE 0.71 09/09/2014 1529   CREATININE 0.7 01/12/2014 0918      Component Value Date/Time   CALCIUM 9.0 01/05/2015 1548   CALCIUM 8.7 01/12/2014 0918   ALKPHOS 67 09/09/2014 1529   ALKPHOS 74 01/12/2014 0918   AST 20 09/09/2014 1529   AST 20 01/12/2014  0918   ALT 15 09/09/2014 1529   ALT 23 01/12/2014 0918   BILITOT 0.4 09/09/2014 1529   BILITOT 0.34 01/12/2014 0918       Impression and Plan:  40 year old woman with the following issues:  1. Iron deficiency anemia: This is related to menorrhagia and her inability to absorb oral iron compounded by possibly gastric bypass surgery. Her hemoglobin is 13.4 today and does not require any IV iron at this time. Discussed  her fatigue with her and I have recommended that she follow-up with her primary care provider for further workup. She does not currently have a primary care provider and I have given her some recommendations of offices that she can call to establish care.  2. Menorrhagia: She is status post hysterectomy which have resolved this issue.  3. Follow-up: She will have her return visit with labs in approximately 3 months.  Mikey Bussing, DNP, [place], AOCNP 5/15/20171:03 PM

## 2015-07-12 NOTE — Telephone Encounter (Signed)
left msg confirming aug apt

## 2015-07-12 NOTE — Telephone Encounter (Signed)
-----   Message from Maryanna Shape, NP sent at 07/12/2015  1:08 PM EDT ----- Regarding: call labs Rose  Please call [name]. Her iron studies are completely normal. No IV iron at this time. F/U in 3 months (I have sent POF).  Recommend that she establish with internal med for further work up of her fatigue.

## 2015-08-19 ENCOUNTER — Ambulatory Visit: Payer: BLUE CROSS/BLUE SHIELD | Admitting: Oncology

## 2015-08-19 ENCOUNTER — Other Ambulatory Visit: Payer: BLUE CROSS/BLUE SHIELD

## 2015-10-12 ENCOUNTER — Other Ambulatory Visit: Payer: BLUE CROSS/BLUE SHIELD

## 2015-10-12 ENCOUNTER — Ambulatory Visit: Payer: BLUE CROSS/BLUE SHIELD | Admitting: Oncology

## 2015-12-13 ENCOUNTER — Other Ambulatory Visit: Payer: Self-pay | Admitting: Obstetrics and Gynecology

## 2016-01-11 ENCOUNTER — Other Ambulatory Visit: Payer: Self-pay | Admitting: Obstetrics and Gynecology

## 2016-01-11 MED ORDER — BUPROPION HCL ER (XL) 300 MG PO TB24: 300.0000 mg | ORAL_TABLET | Freq: Every day | ORAL | 0 refills | Status: DC

## 2016-01-11 NOTE — Telephone Encounter (Signed)
Patient requesting refill on Wellbutrin

## 2016-01-11 NOTE — Telephone Encounter (Signed)
Called patient and set her up for AEX 01/24/16 -eh

## 2016-01-11 NOTE — Telephone Encounter (Signed)
Medication refill request: buPROPion 300mg  Last OV:  10/07/14 Office Visit for Dysuria Next AEX: none scheduled Last MMG (if hormonal medication request): n/a Refill authorized: please advise

## 2016-01-11 NOTE — Telephone Encounter (Signed)
Will call in one month, she is overdue for an annual, please let her know and set up an annual

## 2016-01-24 ENCOUNTER — Ambulatory Visit: Payer: Self-pay | Admitting: Obstetrics and Gynecology

## 2016-02-03 ENCOUNTER — Ambulatory Visit: Payer: Self-pay | Admitting: Obstetrics and Gynecology

## 2016-02-12 ENCOUNTER — Other Ambulatory Visit: Payer: Self-pay | Admitting: Obstetrics and Gynecology

## 2016-02-14 NOTE — Telephone Encounter (Signed)
Medication refill request: wellbutrin  Last AEX:  ? Next AEX: 02/16/16 JJ  Last MMG (if hormonal medication request): 05/04/08 BIRADS1:Neg  Refill authorized: 01/11/16 #[id number]. Today please advise. Patient has canceled AEX appt twice.

## 2016-02-16 ENCOUNTER — Encounter: Payer: Self-pay | Admitting: Obstetrics and Gynecology

## 2016-02-16 ENCOUNTER — Ambulatory Visit (INDEPENDENT_AMBULATORY_CARE_PROVIDER_SITE_OTHER): Payer: BLUE CROSS/BLUE SHIELD | Admitting: Obstetrics and Gynecology

## 2016-02-16 VITALS — BP 130/70 | HR 72 | Resp 18 | Ht 64.0 in | Wt 369.0 lb

## 2016-02-16 DIAGNOSIS — Z9889 Other specified postprocedural states: Secondary | ICD-10-CM

## 2016-02-16 DIAGNOSIS — F418 Other specified anxiety disorders: Secondary | ICD-10-CM | POA: Diagnosis not present

## 2016-02-16 DIAGNOSIS — Z862 Personal history of diseases of the blood and blood-forming organs and certain disorders involving the immune mechanism: Secondary | ICD-10-CM | POA: Diagnosis not present

## 2016-02-16 DIAGNOSIS — Z01419 Encounter for gynecological examination (general) (routine) without abnormal findings: Secondary | ICD-10-CM

## 2016-02-16 DIAGNOSIS — Z9884 Bariatric surgery status: Secondary | ICD-10-CM

## 2016-02-16 MED ORDER — BUPROPION HCL ER (XL) 300 MG PO TB24: 300.0000 mg | ORAL_TABLET | Freq: Every day | ORAL | 3 refills | Status: DC
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
## 2016-02-16 NOTE — Patient Instructions (Signed)

## 2017-02-14 IMAGING — RF DG UGI W/ KUB
19 of 24 series · 19 of 24 positions shown · non-contrast
Comparison: None.

CLINICAL DATA: History of prior gastric bypass, gaining weight.
Morbid obesity.

EXAM:
UPPER GI SERIES WITH KUB
TECHNIQUE: After obtaining a scout radiograph a routine upper GI series was
performed using thin barium
FLUOROSCOPY TIME:  Radiation Exposure Index (as provided by the
fluoroscopic device): 119 d Gy cm2
If the device does not provide the exposure index:
Fluoroscopy Time (in minutes and seconds):
Number of Acquired Images:  1

[Series 1: run · 1 of 1 slices shown (1 of 19)]
[im 1/1]
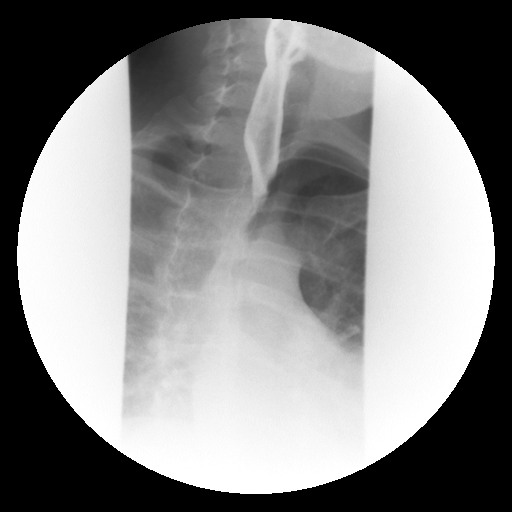

[Series 2: run · 1 of 1 slices shown (2 of 19)]
[im 1/1]
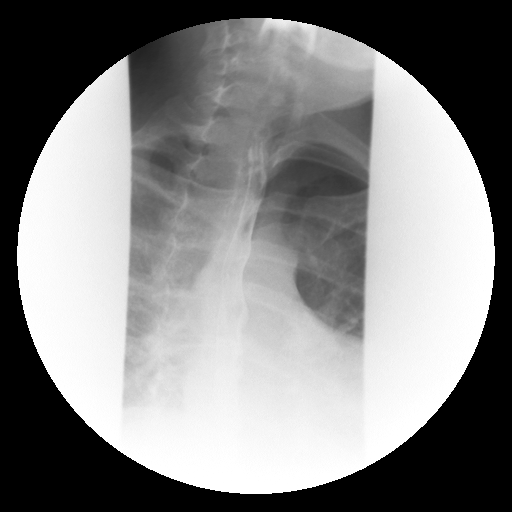

[Series 4: run · 1 of 1 slices shown (3 of 19)]
[im 1/1]
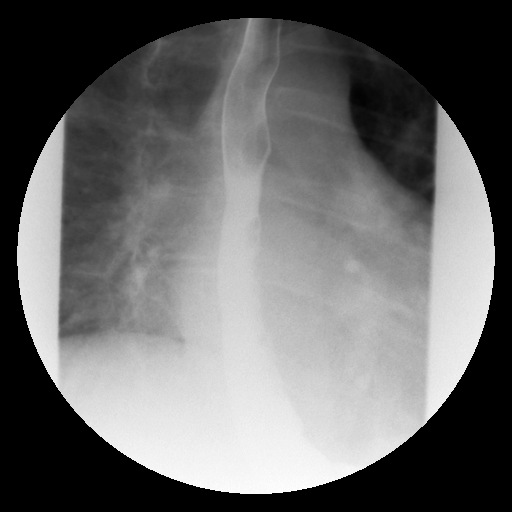

[Series 5: run · 1 of 1 slices shown (4 of 19)]
[im 1/1]
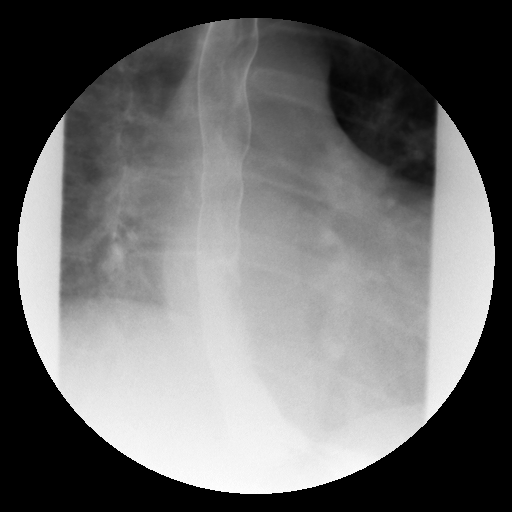

[Series 6: run · 1 of 1 slices shown (5 of 19)]
[im 1/1]
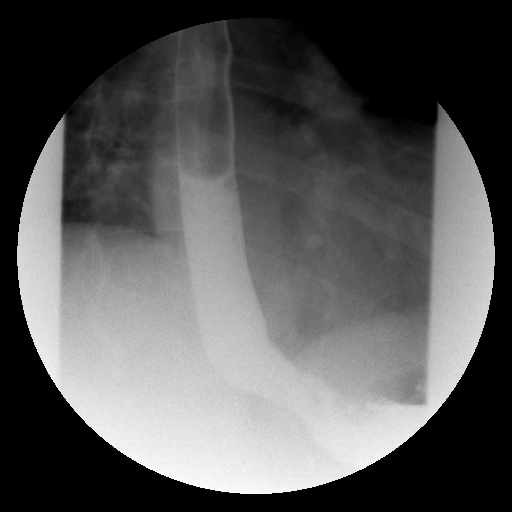

[Series 7: run · 1 of 1 slices shown (6 of 19)]
[im 1/1]
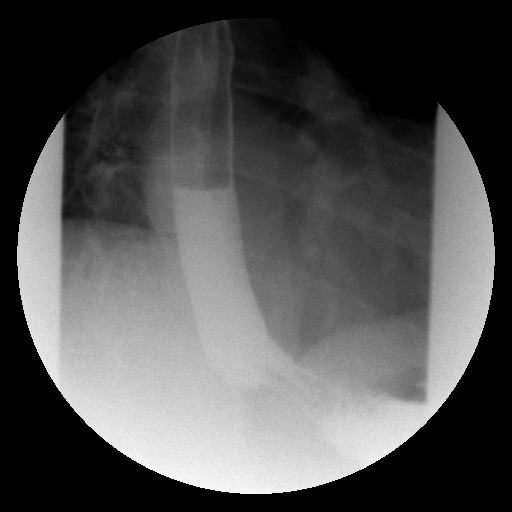

[Series 9: run · 1 of 1 slices shown (7 of 19)]
[im 1/1]
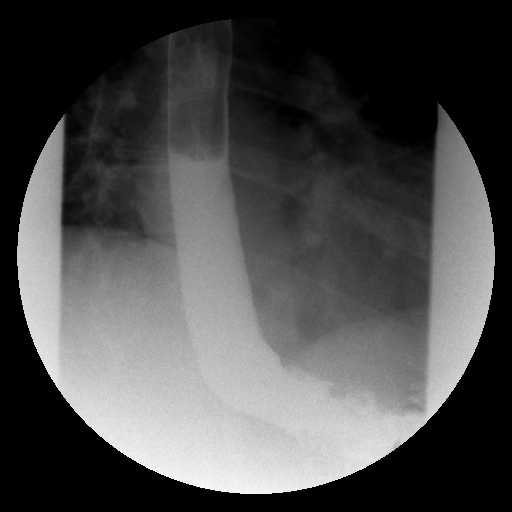

[Series 10: run · 1 of 1 slices shown (8 of 19)]
[im 1/1]
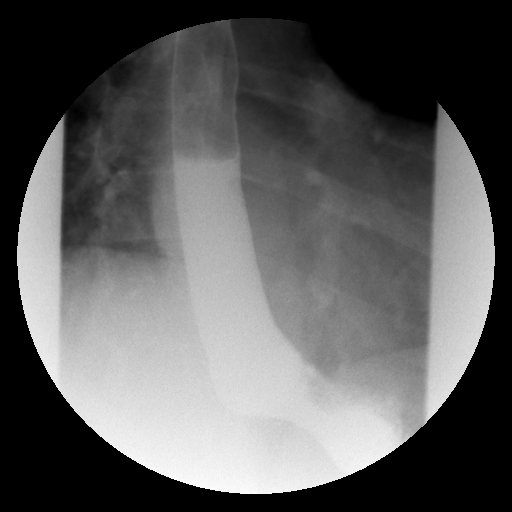

[Series 11: run · 1 of 1 slices shown (9 of 19)]
[im 1/1]
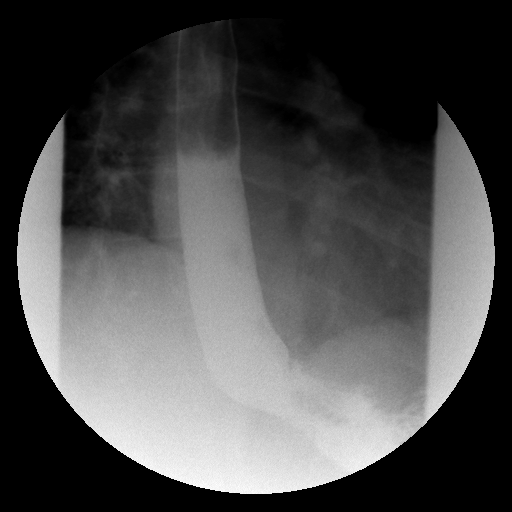

[Series 13: run · 1 of 1 slices shown (10 of 19)]
[im 1/1]
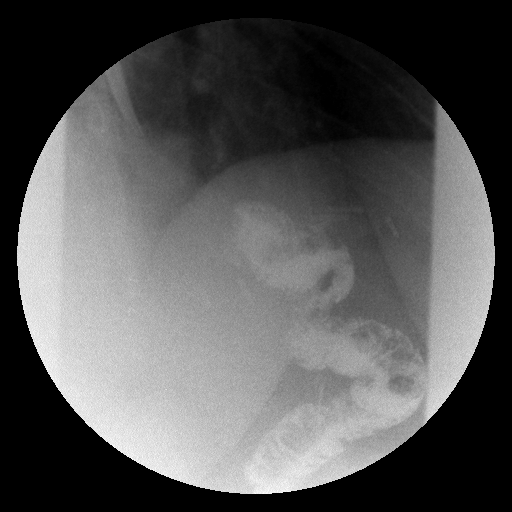

[Series 14: run · 1 of 1 slices shown (11 of 19)]
[im 1/1]
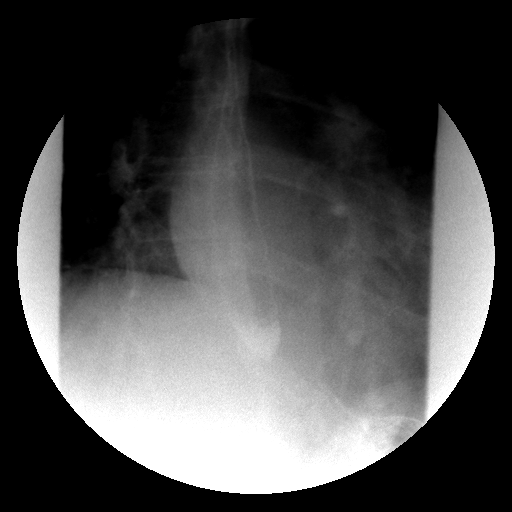

[Series 15: run · 1 of 1 slices shown (12 of 19)]
[im 1/1]
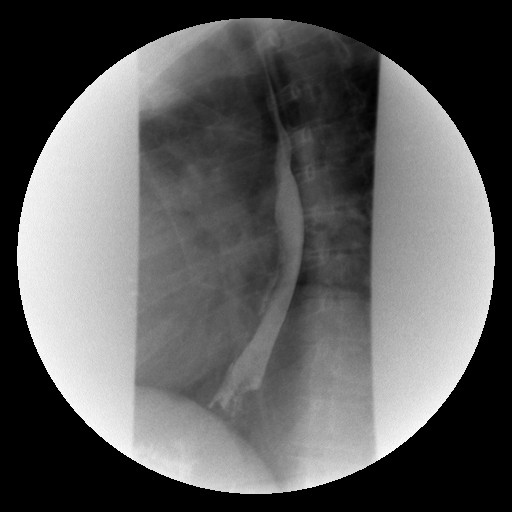

[Series 16: run · 1 of 1 slices shown (13 of 19)]
[im 1/1]
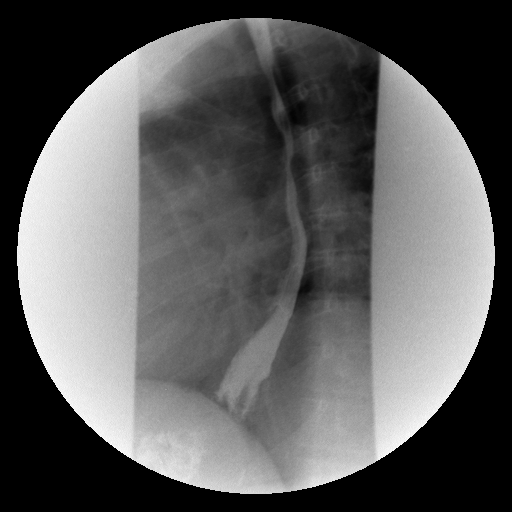

[Series 18: run · 1 of 1 slices shown (14 of 19)]
[im 1/1]
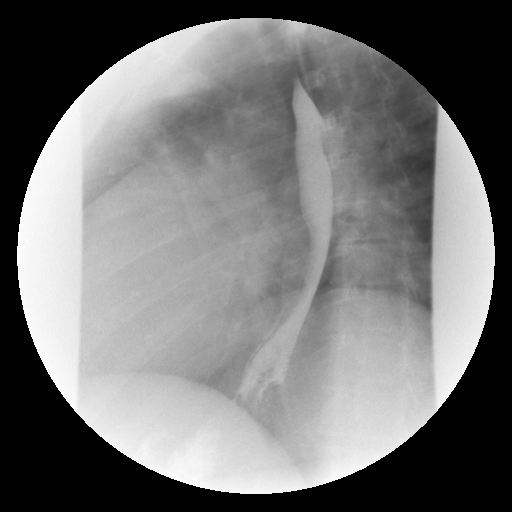

[Series 19: run · 1 of 1 slices shown (15 of 19)]
[im 1/1]
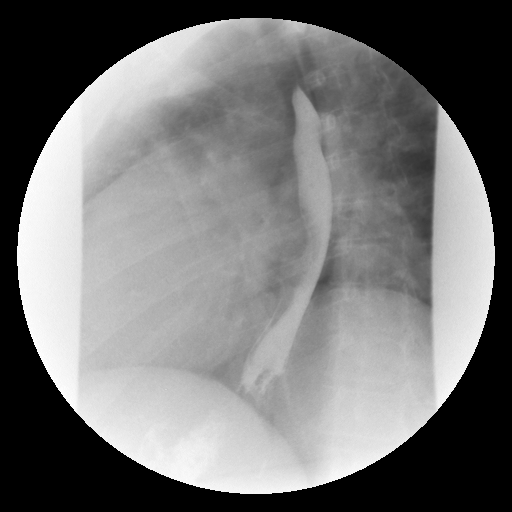

[Series 20: run · 1 of 1 slices shown (16 of 19)]
[im 1/1]
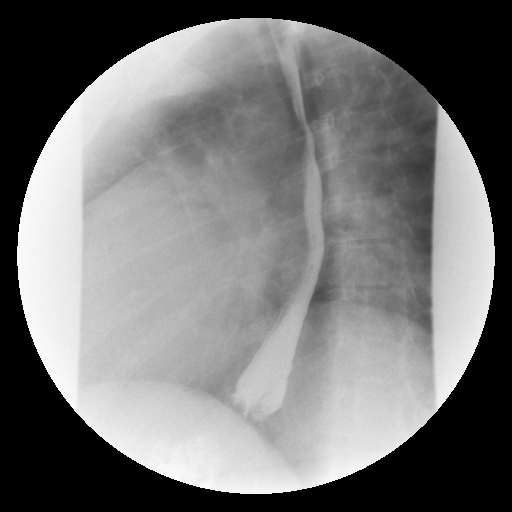

[Series 21: run · 1 of 1 slices shown (17 of 19)]
[im 1/1]
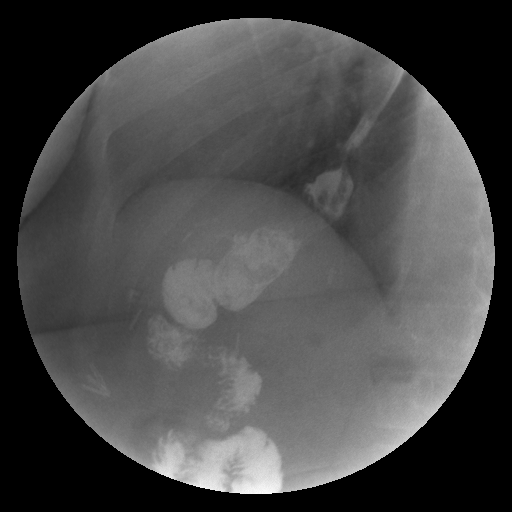

[Series 23: run · 1 of 1 slices shown (18 of 19)]
[im 1/1]
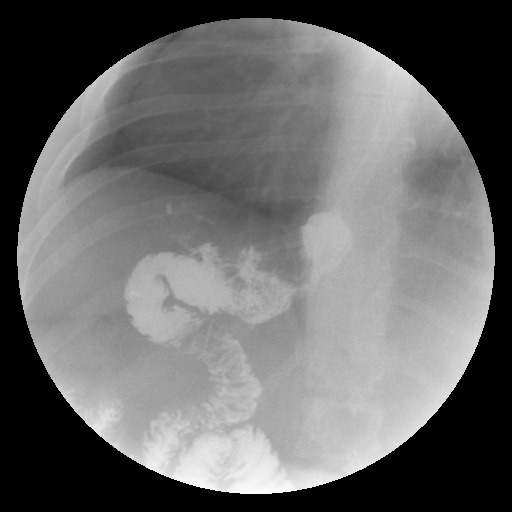

[Series 24: run · 1 of 1 slices shown (19 of 19)]
[im 1/1]
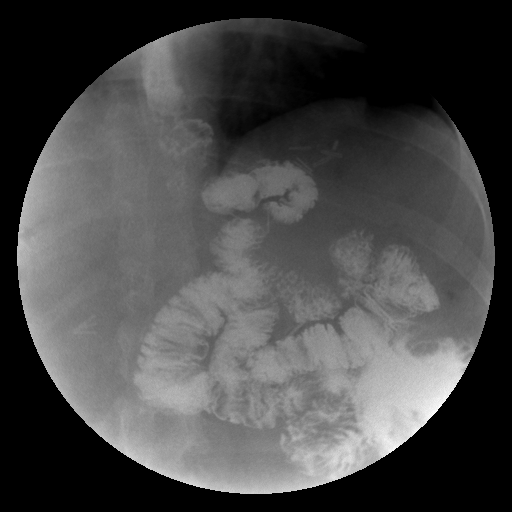

[19 of 24 positions shown; findings below may reference images not displayed]

FINDINGS: Fluoroscopic evaluation of swallowing demonstrates normal esophageal
peristalsis. No fixed stricture, fold thickening or mass.

There is a small gastric pouch with prompt emptying of the pouch
into the jejunum compatible with prior gastric bypass. No evidence
of bowel obstruction. No leak. No unexpected abnormality.
IMPRESSION: Expected appearance of the post gastric bypass stomach and proximal
jejunum.

## 2017-03-13 ENCOUNTER — Other Ambulatory Visit: Payer: Self-pay | Admitting: Obstetrics and Gynecology

## 2017-03-13 NOTE — Telephone Encounter (Signed)
Medication refill request: Wellbutrin Last AEX:  02/16/16 JJ Next AEX: not scheduled Last MMG (if hormonal medication request): none  Refill authorized: 02/16/16 #90 w/3 refills; today please advise  Tried calling patient to schedule AEX, no answer, Message left for patient to return my call.

## 2017-03-13 NOTE — Telephone Encounter (Signed)
One month of Wellbutrin sent. She needs to come in for her annual

## 2017-03-14 NOTE — Telephone Encounter (Signed)
Spoke with patient in an attempt to schedule an AEX. Patient declines being scheduled for an annual and states "the only time I need to come to the doctor is if I'm dying or need a refill." After thoroughly explaining the importance of having an AEX, patient refused appointment. Patient states that she would like a call or an email from Dr. Talbert Nan to discuss appointment.

## 2017-03-14 NOTE — Telephone Encounter (Signed)
Tried calling patient to schedule for AEX, no answer, Message left for patient to return my call.

## 2017-03-15 NOTE — Telephone Encounter (Signed)
Call to patient regarding annual exam.  Patient states she has issues with annual due to trauma from past GYN exam. (not this office). Patient states if there is no other option in order to continue on medication, she will come for annual but will need "Xanax or something to get her through it." Advised annual exam requires will need to review this with Dr Talbert Nan and call back.

## 2017-03-21 NOTE — Telephone Encounter (Signed)
Call to patient. Advised of recommendations from Dr Talbert Nan. Patient agreeable to annual with medication for anxiety and husband will drive her to and from appointment.  Appointment scheduled for 04-02-17 at 415. Pharmacy confirmed.

## 2017-03-21 NOTE — Telephone Encounter (Signed)
Please let the patient know that she is due for a pap. We can certainly call in ativan or xanax for her for the appointment (she will need a ride). I'm so sorry for the stress this causes her. She should also be getting screening lab work and mammograms as well.  She was started on testosterone pellets by an integrative MD last year. I'm not sure if she is still getting these, but her hormone levels should be followed.

## 2017-03-28 ENCOUNTER — Telehealth: Payer: Self-pay | Admitting: *Deleted

## 2017-03-28 ENCOUNTER — Other Ambulatory Visit: Payer: Self-pay | Admitting: Obstetrics and Gynecology

## 2017-03-28 MED ORDER — ALPRAZOLAM 0.5 MG PO TABS: ORAL_TABLET | ORAL | 0 refills | Status: DC

## 2017-03-28 NOTE — Telephone Encounter (Signed)
-----   Message from Huey Romans, RN sent at 03/26/2017  4:39 PM EST ----- Regarding: RX for annual This patient is scheduled for next Monday at 415. She will need extra time and Dr Talbert Nan has agreed to send in a xanax or similar med for the patient to "tolerate" the exam. This is a reminder to help be sure we can get this taken care of for the patient.

## 2017-03-28 NOTE — Progress Notes (Signed)
Patient notified that RX was sent and to be sure to have a driver -eh

## 2017-03-28 NOTE — Telephone Encounter (Signed)
Please see message below. Patient requesting a RX for Xanax prior to exam -eh

## 2017-03-28 NOTE — Telephone Encounter (Signed)
Script sent  

## 2017-03-28 NOTE — Progress Notes (Signed)
Script sent for Xanax 0.5 mg, 1 tablet po one hour prior to her appointment. She will need a ride.  Please let the patient know

## 2017-03-29 NOTE — Telephone Encounter (Signed)
Patient aware RX sent in-eh

## 2017-04-02 ENCOUNTER — Encounter: Payer: Self-pay | Admitting: Obstetrics and Gynecology

## 2017-04-02 ENCOUNTER — Ambulatory Visit: Payer: BLUE CROSS/BLUE SHIELD | Admitting: Obstetrics and Gynecology

## 2017-04-02 ENCOUNTER — Other Ambulatory Visit: Payer: Self-pay

## 2017-04-02 VITALS — BP 136/76 | HR 100 | Resp 20 | Ht 62.5 in | Wt 360.0 lb

## 2017-04-02 DIAGNOSIS — F419 Anxiety disorder, unspecified: Secondary | ICD-10-CM | POA: Diagnosis not present

## 2017-04-02 DIAGNOSIS — Z862 Personal history of diseases of the blood and blood-forming organs and certain disorders involving the immune mechanism: Secondary | ICD-10-CM

## 2017-04-02 DIAGNOSIS — Z0000 Encounter for general adult medical examination without abnormal findings: Secondary | ICD-10-CM | POA: Diagnosis not present

## 2017-04-02 DIAGNOSIS — Z9884 Bariatric surgery status: Secondary | ICD-10-CM

## 2017-04-02 DIAGNOSIS — Z01419 Encounter for gynecological examination (general) (routine) without abnormal findings: Secondary | ICD-10-CM

## 2017-04-02 DIAGNOSIS — Z6841 Body Mass Index (BMI) 40.0 and over, adult: Secondary | ICD-10-CM | POA: Diagnosis not present

## 2017-04-02 MED ORDER — BUPROPION HCL ER (XL) 300 MG PO TB24: 300.0000 mg | ORAL_TABLET | Freq: Every day | ORAL | 3 refills | Status: DC
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
## 2017-04-03 LAB — CBC
HEMATOCRIT: 40.3 % (ref 34.0–46.6)
Hemoglobin: 12.9 g/dL (ref 11.1–15.9)
MCH: 27.8 pg (ref 26.6–33.0)
MCHC: 32 g/dL (ref 31.5–35.7)
MCV: 87 fL (ref 79–97)
PLATELETS: 337 10*3/uL (ref 150–379)
RBC: 4.64 x10E6/uL (ref 3.77–5.28)
RDW: 13.7 % (ref 12.3–15.4)
WBC: 8.7 10*3/uL (ref 3.4–10.8)

## 2017-04-03 LAB — VITAMIN D 25 HYDROXY (VIT D DEFICIENCY, FRACTURES): VIT D 25 HYDROXY: 31.6 ng/mL (ref 30.0–100.0)

## 2017-04-03 LAB — B12 AND FOLATE PANEL
FOLATE: 13.7 ng/mL (ref >3.0)
VITAMIN B 12: 324 pg/mL (ref 232–1245)

## 2017-04-03 LAB — LIPID PANEL
Chol/HDL Ratio: 3.8 ratio (ref 0.0–4.4)
Cholesterol, Total: 183 mg/dL (ref 100–199)
HDL: 48 mg/dL (ref >39)
LDL Calculated: 102 mg/dL — ABNORMAL HIGH (ref 0–99)
TRIGLYCERIDES: 165 mg/dL — AB (ref 0–149)
VLDL CHOLESTEROL CAL: 33 mg/dL (ref 5–40)

## 2017-04-03 LAB — IRON: Iron: 68 ug/dL (ref 27–159)

## 2017-04-03 LAB — HEMOGLOBIN A1C
ESTIMATED AVERAGE GLUCOSE: 105 mg/dL
Hgb A1c MFr Bld: 5.3 % (ref 4.8–5.6)

## 2017-04-03 LAB — TSH: TSH: 3.76 u[IU]/mL (ref 0.450–4.500)

## 2017-04-03 LAB — FERRITIN: Ferritin: 14 ng/mL — ABNORMAL LOW (ref 15–150)

## 2017-09-25 ENCOUNTER — Telehealth: Payer: Self-pay | Admitting: *Deleted

## 2017-09-25 ENCOUNTER — Other Ambulatory Visit: Payer: Self-pay | Admitting: Oncology

## 2017-09-25 ENCOUNTER — Telehealth: Payer: Self-pay | Admitting: Oncology

## 2017-09-25 DIAGNOSIS — D649 Anemia, unspecified: Secondary | ICD-10-CM

## 2017-09-25 NOTE — Telephone Encounter (Signed)
Lab + APP this week.

## 2017-09-25 NOTE — Telephone Encounter (Signed)
Called pt re appts that were added per 7/30 sch msg - spoke w/ pt re appts

## 2017-09-25 NOTE — Progress Notes (Signed)
Hematology and Oncology Follow Up Visit  Cyncere Ruhe [id number] 1975/08/21 42 y.o. 09/25/2017 4:07 PM  Principle Diagnosis: 42 year old woman with iron deficiency anemia likely due to menorrhagia and gastric bypass surgery in the past first evaluated in November of 2014.   Prior Therapy: She is status post Feraheme IV infusion on 01/10/2013 that was repeated on 03/07/2013, 05/25/15, and 06/01/15. She is status post hysterectomy in February 2015 due to menorrhagia.  Current therapy: Observation.  Interim History: Ms. Talkington presents today for a followup visit.  The patient was lost to follow-up.  Her last visit with [place] was in May 2017.  Last IV iron was given in April 2017.  She contacted our office recently reporting increased fatigue and felt as though she did when she needed iron in the past.  Today, the patient reports that she is extremely fatigued.  She reports shortness of breath with exertion, intermittent palpitations, headaches, and difficulty sleeping.  She has had these symptoms previously when she was iron deficient.  She is not taking oral iron due to her history of gastric bypass.  She denies bleeding gums, epistaxis, hemoptysis, hematuria, vaginal bleeding, blood in her stool.  She has not reported any chest pain, She does not report any blurred vision. Did not report any syncope or seizures. She does not report any nausea or vomiting or change in her bowel habits. She does not report any lymphadenopathy or skin rashes. She does not report any petechiae or bleeding. Rest of her review of systems unremarkable.  Medications: I have reviewed the patient's current medications.  Current Outpatient Medications  Medication Sig Dispense Refill  . ALPRAZolam (XANAX) 0.5 MG tablet Take one tablet an hour prior to your appointment. 1 tablet 0  . buPROPion (WELLBUTRIN XL) 300 MG 24 hr tablet Take 1 tablet (300 mg total) by mouth daily. 90 tablet 3   No current facility-administered medications  for this visit.      Allergies:  Allergies  Allergen Reactions  . Adhesive [Tape] Hives  . Amoxicillin Hives    Childhood allergy. Pt. Can tolerate Cephalosporins.    Past Medical History, Surgical history, Social history, and Family History were reviewed and updated.   Physical Exam: Blood pressure (!) 148/91, pulse 91, temperature 98.8 F (37.1 C), temperature source Oral, resp. rate 18, height 5' 2.5" (1.588 m), weight (!) 366 lb 9.6 oz (166.3 kg), last menstrual period 02/28/2013, SpO2 100 %. ECOG: 0 General appearance: alert, cooperative and appears stated age Head: Normocephalic, without obvious abnormality, atraumatic Neck: no adenopathy Lymph nodes: Cervical, supraclavicular, and axillary nodes normal. Heart:regular rate and rhythm, S1, S2 normal, no murmur, click, rub or gallop Lung:chest clear, no wheezing, rales, normal symmetric air entry Abdomen: soft, non-tender, without masses or organomegaly EXT:no erythema, induration, or nodules   Lab Results: Lab Results  Component Value Date   WBC 7.3 09/26/2017   HGB 13.2 09/26/2017   HCT 39.8 09/26/2017   MCV 83.0 09/26/2017   PLT 273 09/26/2017     Chemistry      Component Value Date/Time   NA 137 01/05/2015 1548   NA 141 01/12/2014 0918   K 4.4 01/05/2015 1548   K 4.1 01/12/2014 0918   CL 107 01/05/2015 1548   CO2 25 01/05/2015 1548   CO2 26 01/12/2014 0918   BUN 16 01/05/2015 1548   BUN 11.9 01/12/2014 0918   CREATININE 0.77 01/05/2015 1548   CREATININE 0.71 09/09/2014 1529   CREATININE 0.7 01/12/2014 3557  Component Value Date/Time   CALCIUM 9.0 01/05/2015 1548   CALCIUM 8.7 01/12/2014 0918   ALKPHOS 67 09/09/2014 1529   ALKPHOS 74 01/12/2014 0918   AST 20 09/09/2014 1529   AST 20 01/12/2014 0918   ALT 15 09/09/2014 1529   ALT 23 01/12/2014 0918   BILITOT 0.4 09/09/2014 1529   BILITOT 0.34 01/12/2014 0918       Impression and Plan:  42 year old woman with the following issues:  1.  Iron deficiency anemia: This is related to her inability to absorb oral iron compounded by possibly gastric bypass surgery. Her hemoglobin is 13.2 today.  Ferritin is 11.  She is more symptomatic.  We will set her up for Feraheme 510 mg IV x2 doses.  Risks of this medication were discussed with the patient including possibility of infusion reaction.  The patient is agreeable to proceed.  2. Menorrhagia: She is status post hysterectomy which have resolved this issue.  3. Follow-up: The patient will follow-up for routine visit with repeat lab work in approximately 6 months.  Plan reviewed with Dr Alen Blew.  Mikey Bussing, [place], [place], [place] 7/30/20194:07 PM

## 2017-09-25 NOTE — Telephone Encounter (Signed)
Los to schedulers for app visit with labs, this week. Patient notified that schedulers will be calling her.

## 2017-09-25 NOTE — Telephone Encounter (Signed)
Patient calling. States she is "struggling" would like to come in for lab work and dr visit, re:her anemia.

## 2017-09-26 ENCOUNTER — Encounter: Payer: Self-pay | Admitting: Oncology

## 2017-09-26 ENCOUNTER — Telehealth: Payer: Self-pay | Admitting: Oncology

## 2017-09-26 ENCOUNTER — Inpatient Hospital Stay: Payer: BLUE CROSS/BLUE SHIELD | Admitting: Oncology

## 2017-09-26 ENCOUNTER — Inpatient Hospital Stay: Payer: BLUE CROSS/BLUE SHIELD | Attending: Oncology

## 2017-09-26 VITALS — BP 148/91 | HR 91 | Temp 98.8°F | Resp 18 | Ht 62.5 in | Wt 366.6 lb

## 2017-09-26 DIAGNOSIS — Z90710 Acquired absence of both cervix and uterus: Secondary | ICD-10-CM

## 2017-09-26 DIAGNOSIS — R0602 Shortness of breath: Secondary | ICD-10-CM | POA: Insufficient documentation

## 2017-09-26 DIAGNOSIS — N920 Excessive and frequent menstruation with regular cycle: Secondary | ICD-10-CM | POA: Diagnosis not present

## 2017-09-26 DIAGNOSIS — Z9884 Bariatric surgery status: Secondary | ICD-10-CM | POA: Insufficient documentation

## 2017-09-26 DIAGNOSIS — D500 Iron deficiency anemia secondary to blood loss (chronic): Secondary | ICD-10-CM | POA: Insufficient documentation

## 2017-09-26 DIAGNOSIS — Z79899 Other long term (current) drug therapy: Secondary | ICD-10-CM | POA: Diagnosis not present

## 2017-09-26 DIAGNOSIS — D649 Anemia, unspecified: Secondary | ICD-10-CM

## 2017-09-26 DIAGNOSIS — R5383 Other fatigue: Secondary | ICD-10-CM

## 2017-09-26 LAB — FERRITIN: FERRITIN: 11 ng/mL (ref 11–307)

## 2017-09-26 LAB — CBC WITH DIFFERENTIAL (CANCER CENTER ONLY)
BASOS ABS: 0 10*3/uL (ref 0.0–0.1)
Basophils Relative: 1 %
EOS PCT: 2 %
Eosinophils Absolute: 0.1 10*3/uL (ref 0.0–0.5)
HCT: 39.8 % (ref 34.8–46.6)
Hemoglobin: 13.2 g/dL (ref 11.6–15.9)
LYMPHS PCT: 22 %
Lymphs Abs: 1.6 10*3/uL (ref 0.9–3.3)
MCH: 27.5 pg (ref 25.1–34.0)
MCHC: 33.1 g/dL (ref 31.5–36.0)
MCV: 83 fL (ref 79.5–101.0)
Monocytes Absolute: 0.5 10*3/uL (ref 0.1–0.9)
Monocytes Relative: 7 %
NEUTROS ABS: 5 10*3/uL (ref 1.5–6.5)
Neutrophils Relative %: 68 %
PLATELETS: 273 10*3/uL (ref 145–400)
RBC: 4.79 MIL/uL (ref 3.70–5.45)
RDW: 14.4 % (ref 11.2–14.5)
WBC: 7.3 10*3/uL (ref 3.9–10.3)

## 2017-09-26 LAB — IRON AND TIBC
IRON: 60 ug/dL (ref 41–142)
SATURATION RATIOS: 14 % — AB (ref 21–57)
TIBC: 433 ug/dL (ref 236–444)
UIBC: 373 ug/dL

## 2017-09-26 NOTE — Telephone Encounter (Signed)
Scheduled appt per 7/31 los - left message for patient with appt date and time and sent reminder letter in the mail with appt date and time.

## 2017-09-28 ENCOUNTER — Ambulatory Visit: Payer: BLUE CROSS/BLUE SHIELD | Admitting: Oncology

## 2017-09-28 ENCOUNTER — Other Ambulatory Visit: Payer: BLUE CROSS/BLUE SHIELD

## 2017-10-03 ENCOUNTER — Inpatient Hospital Stay: Payer: BLUE CROSS/BLUE SHIELD | Attending: Oncology

## 2017-10-03 VITALS — BP 134/93 | HR 97 | Temp 98.2°F | Resp 18

## 2017-10-03 DIAGNOSIS — D500 Iron deficiency anemia secondary to blood loss (chronic): Secondary | ICD-10-CM | POA: Diagnosis present

## 2017-10-03 DIAGNOSIS — D649 Anemia, unspecified: Secondary | ICD-10-CM

## 2017-10-03 DIAGNOSIS — Z79899 Other long term (current) drug therapy: Secondary | ICD-10-CM | POA: Diagnosis not present

## 2017-10-03 MED ORDER — SODIUM CHLORIDE 0.9 % IV SOLN
Freq: Once | INTRAVENOUS | Status: AC
  Administered 2017-10-03: 16:00:00 via INTRAVENOUS
  Filled 2017-10-03: qty 250

## 2017-10-03 MED ORDER — SODIUM CHLORIDE 0.9 % IV SOLN
510.0000 mg | Freq: Once | INTRAVENOUS | Status: AC
  Administered 2017-10-03: 510 mg via INTRAVENOUS
  Filled 2017-10-03: qty 17

## 2017-10-03 NOTE — Patient Instructions (Signed)

## 2017-10-10 ENCOUNTER — Inpatient Hospital Stay: Payer: BLUE CROSS/BLUE SHIELD

## 2017-10-10 VITALS — BP 142/87 | HR 96 | Temp 98.4°F | Resp 18

## 2017-10-10 DIAGNOSIS — D649 Anemia, unspecified: Secondary | ICD-10-CM

## 2017-10-10 DIAGNOSIS — D500 Iron deficiency anemia secondary to blood loss (chronic): Secondary | ICD-10-CM | POA: Diagnosis not present

## 2017-10-10 MED ORDER — SODIUM CHLORIDE 0.9 % IV SOLN
Freq: Once | INTRAVENOUS | Status: AC
  Administered 2017-10-10: 12:00:00 via INTRAVENOUS
  Filled 2017-10-10: qty 250

## 2017-10-10 MED ORDER — SODIUM CHLORIDE 0.9 % IV SOLN
510.0000 mg | Freq: Once | INTRAVENOUS | Status: AC
  Administered 2017-10-10: 510 mg via INTRAVENOUS
  Filled 2017-10-10: qty 17

## 2017-10-10 NOTE — Progress Notes (Signed)
Patient declined to stay for 30 minute post-observation period

## 2017-10-10 NOTE — Patient Instructions (Signed)

## 2018-03-12 ENCOUNTER — Inpatient Hospital Stay: Payer: BLUE CROSS/BLUE SHIELD

## 2018-03-12 ENCOUNTER — Telehealth: Payer: Self-pay | Admitting: Oncology

## 2018-03-12 ENCOUNTER — Inpatient Hospital Stay: Payer: BLUE CROSS/BLUE SHIELD | Admitting: Oncology

## 2018-03-12 NOTE — Telephone Encounter (Signed)
Returned call to patient re rescheduling lab. Per patient she has blood work done by primary yesterday and wants to cx lab/fu with FS. Per patient she does not want to rescheduling until having results back from primary as the f/u with FS may not be necessary. Per patient she will have primary fax labs results to our office. Message routed to [facility].

## 2018-03-12 NOTE — Telephone Encounter (Signed)
Noted  

## 2018-03-24 ENCOUNTER — Other Ambulatory Visit: Payer: Self-pay | Admitting: Obstetrics and Gynecology

## 2018-03-25 NOTE — Telephone Encounter (Signed)
Medication refill request: wellbutrin  Last AEX:  04/02/17 JJ Next AEX: none Last MMG (if hormonal medication request): 2010 Refill authorized: 04/02/17 #[id number]. Today #[id number]?

## 2018-03-25 NOTE — Telephone Encounter (Signed)
One month sent in. The patient is due for an annual exam in 2/20
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
## 2018-04-10 ENCOUNTER — Ambulatory Visit: Payer: BLUE CROSS/BLUE SHIELD | Admitting: Obstetrics and Gynecology

## 2018-04-10 ENCOUNTER — Other Ambulatory Visit: Payer: Self-pay

## 2018-04-10 ENCOUNTER — Encounter: Payer: Self-pay | Admitting: Obstetrics and Gynecology

## 2018-04-10 VITALS — BP 142/90 | HR 76 | Ht 64.0 in | Wt 390.0 lb

## 2018-04-10 DIAGNOSIS — Z9884 Bariatric surgery status: Secondary | ICD-10-CM | POA: Diagnosis not present

## 2018-04-10 DIAGNOSIS — Z90710 Acquired absence of both cervix and uterus: Secondary | ICD-10-CM

## 2018-04-10 DIAGNOSIS — Z862 Personal history of diseases of the blood and blood-forming organs and certain disorders involving the immune mechanism: Secondary | ICD-10-CM | POA: Diagnosis not present

## 2018-04-10 DIAGNOSIS — Z6841 Body Mass Index (BMI) 40.0 and over, adult: Secondary | ICD-10-CM

## 2018-04-10 DIAGNOSIS — Z01419 Encounter for gynecological examination (general) (routine) without abnormal findings: Secondary | ICD-10-CM | POA: Diagnosis not present

## 2018-04-10 MED ORDER — BUPROPION HCL ER (XL) 300 MG PO TB24: 300.0000 mg | ORAL_TABLET | Freq: Every day | ORAL | 3 refills | Status: DC

## 2018-04-10 NOTE — Patient Instructions (Signed)

## 2018-04-11 ENCOUNTER — Other Ambulatory Visit: Payer: Self-pay | Admitting: Obstetrics and Gynecology

## 2018-04-11 DIAGNOSIS — Z1231 Encounter for screening mammogram for malignant neoplasm of breast: Secondary | ICD-10-CM

## 2018-05-07 ENCOUNTER — Ambulatory Visit
Admission: RE | Admit: 2018-05-07 | Discharge: 2018-05-07 | Disposition: A | Discharge status: Home / Self Care | Payer: BLUE CROSS/BLUE SHIELD | Source: Ambulatory Visit | Attending: Obstetrics and Gynecology | Admitting: Obstetrics and Gynecology

## 2018-05-07 DIAGNOSIS — Z1231 Encounter for screening mammogram for malignant neoplasm of breast: Secondary | ICD-10-CM

## 2018-07-28 ENCOUNTER — Encounter (HOSPITAL_COMMUNITY): Payer: Self-pay | Admitting: Emergency Medicine

## 2018-07-28 ENCOUNTER — Emergency Department (HOSPITAL_COMMUNITY)
Admission: EM | Admit: 2018-07-28 | Discharge: 2018-07-28 | Disposition: A | Discharge status: Home / Self Care | Payer: BLUE CROSS/BLUE SHIELD | Attending: Emergency Medicine | Admitting: Emergency Medicine

## 2018-07-28 ENCOUNTER — Emergency Department (HOSPITAL_COMMUNITY): Payer: BLUE CROSS/BLUE SHIELD

## 2018-07-28 ENCOUNTER — Other Ambulatory Visit: Payer: Self-pay

## 2018-07-28 DIAGNOSIS — N10 Acute tubulo-interstitial nephritis: Secondary | ICD-10-CM | POA: Diagnosis not present

## 2018-07-28 DIAGNOSIS — Z79899 Other long term (current) drug therapy: Secondary | ICD-10-CM | POA: Insufficient documentation

## 2018-07-28 DIAGNOSIS — N12 Tubulo-interstitial nephritis, not specified as acute or chronic: Secondary | ICD-10-CM

## 2018-07-28 DIAGNOSIS — Z1159 Encounter for screening for other viral diseases: Secondary | ICD-10-CM | POA: Diagnosis not present

## 2018-07-28 DIAGNOSIS — I10 Essential (primary) hypertension: Secondary | ICD-10-CM | POA: Insufficient documentation

## 2018-07-28 DIAGNOSIS — R1032 Left lower quadrant pain: Secondary | ICD-10-CM | POA: Diagnosis present

## 2018-07-28 LAB — COMPREHENSIVE METABOLIC PANEL
ALT: 19 U/L (ref 0–44)
AST: 22 U/L (ref 15–41)
Albumin: 3.5 g/dL (ref 3.5–5.0)
Alkaline Phosphatase: 57 U/L (ref 38–126)
Anion gap: 11 (ref 5–15)
BUN: 15 mg/dL (ref 6–20)
CO2: 23 mmol/L (ref 22–32)
Calcium: 9 mg/dL (ref 8.9–10.3)
Chloride: 104 mmol/L (ref 98–111)
Creatinine, Ser: 0.83 mg/dL (ref 0.44–1.00)
GFR calc Af Amer: >60 mL/min (ref >60)
GFR calc non Af Amer: >60 mL/min (ref >60)
Glucose, Bld: 108 mg/dL — ABNORMAL HIGH (ref 70–99)
Potassium: 4 mmol/L (ref 3.5–5.1)
Sodium: 138 mmol/L (ref 135–145)
Total Bilirubin: 0.6 mg/dL (ref 0.3–1.2)
Total Protein: 6.8 g/dL (ref 6.5–8.1)

## 2018-07-28 LAB — CBC WITH DIFFERENTIAL/PLATELET
Abs Immature Granulocytes: 0.03 10*3/uL (ref 0.00–0.07)
Basophils Absolute: 0 10*3/uL (ref 0.0–0.1)
Basophils Relative: 0 %
Eosinophils Absolute: 0.3 10*3/uL (ref 0.0–0.5)
Eosinophils Relative: 4 %
HCT: 43.2 % (ref 36.0–46.0)
Hemoglobin: 14.4 g/dL (ref 12.0–15.0)
Immature Granulocytes: 0 %
Lymphocytes Relative: 24 %
Lymphs Abs: 1.7 10*3/uL (ref 0.7–4.0)
MCH: 29.4 pg (ref 26.0–34.0)
MCHC: 33.3 g/dL (ref 30.0–36.0)
MCV: 88.2 fL (ref 80.0–100.0)
Monocytes Absolute: 0.4 10*3/uL (ref 0.1–1.0)
Monocytes Relative: 6 %
Neutro Abs: 4.9 10*3/uL (ref 1.7–7.7)
Neutrophils Relative %: 66 %
Platelets: 321 10*3/uL (ref 150–400)
RBC: 4.9 MIL/uL (ref 3.87–5.11)
RDW: 12.8 % (ref 11.5–15.5)
WBC: 7.3 10*3/uL (ref 4.0–10.5)
nRBC: 0 % (ref 0.0–0.2)

## 2018-07-28 LAB — URINALYSIS, ROUTINE W REFLEX MICROSCOPIC
Bilirubin Urine: NEGATIVE
Glucose, UA: NEGATIVE mg/dL
Hgb urine dipstick: NEGATIVE
Ketones, ur: NEGATIVE mg/dL
Leukocytes,Ua: NEGATIVE
Nitrite: NEGATIVE
Protein, ur: NEGATIVE mg/dL
Specific Gravity, Urine: >1.046 — ABNORMAL HIGH (ref 1.005–1.030)
pH: 5 (ref 5.0–8.0)

## 2018-07-28 LAB — LIPASE, BLOOD: Lipase: 22 U/L (ref 11–51)

## 2018-07-28 MED ORDER — CEPHALEXIN 500 MG PO CAPS: 500.0000 mg | ORAL_CAPSULE | Freq: Four times a day (QID) | ORAL | 0 refills | Status: AC

## 2018-07-28 MED ORDER — HYDROCODONE-ACETAMINOPHEN 5-325 MG PO TABS: 1.0000 | ORAL_TABLET | Freq: Four times a day (QID) | ORAL | 0 refills | Status: DC | PRN

## 2018-07-28 MED ORDER — HYDROCODONE-ACETAMINOPHEN 5-325 MG PO TABS
1.0000 | ORAL_TABLET | Freq: Once | ORAL | Status: AC
  Administered 2018-07-28: 1 via ORAL
  Filled 2018-07-28: qty 1

## 2018-07-28 MED ORDER — ACETAMINOPHEN 325 MG PO TABS
325.0000 mg | ORAL_TABLET | Freq: Once | ORAL | Status: AC
  Administered 2018-07-28: 325 mg via ORAL
  Filled 2018-07-28: qty 1

## 2018-07-28 MED ORDER — KETOROLAC TROMETHAMINE 30 MG/ML IJ SOLN
30.0000 mg | Freq: Once | INTRAMUSCULAR | Status: AC
  Administered 2018-07-28: 30 mg via INTRAVENOUS
  Filled 2018-07-28: qty 1

## 2018-07-28 MED ORDER — SODIUM CHLORIDE 0.9 % IV BOLUS
1000.0000 mL | Freq: Once | INTRAVENOUS | Status: AC
  Administered 2018-07-28: 08:00:00 1000 mL via INTRAVENOUS

## 2018-07-28 MED ORDER — ONDANSETRON HCL 4 MG/2ML IJ SOLN
4.0000 mg | Freq: Once | INTRAMUSCULAR | Status: AC
  Administered 2018-07-28: 4 mg via INTRAVENOUS
  Filled 2018-07-28: qty 2

## 2018-07-28 MED ORDER — IOHEXOL 300 MG/ML  SOLN
100.0000 mL | Freq: Once | INTRAMUSCULAR | Status: AC | PRN
  Administered 2018-07-28: 100 mL via INTRAVENOUS

## 2018-07-28 MED ORDER — SODIUM CHLORIDE 0.9 % IV SOLN
1.0000 g | Freq: Once | INTRAVENOUS | Status: AC
  Administered 2018-07-28: 1 g via INTRAVENOUS
  Filled 2018-07-28: qty 10

## 2018-07-28 MED ORDER — ONDANSETRON 4 MG PO TBDP: 4.0000 mg | ORAL_TABLET | Freq: Three times a day (TID) | ORAL | 0 refills | Status: DC | PRN

## 2018-07-28 NOTE — ED Provider Notes (Signed)
[facility] DEPARTMENT Provider Note   CSN: [id number] Arrival date & time: 07/28/18  6010    History   Chief Complaint Chief Complaint  Patient presents with  . Abdominal Pain    HPI [name] is a 43 y.o. female.     HPI   [name] is a 43 y.o. female, with a history of anxiety, chronic anemia, HTN, morbid obesity, presenting to the ED with abdominal pain beginning last night.  Pain arose suddenly in the left lower quadrant, radiating to the left flank and left lower back, waxing and waning somewhat, currently 9/10, throughout the interview she describes it as throbbing, pulsating, "like contractions," with intermittent stabbing.  Accompanied by nausea. She has not had this pain before.  Last bowel movement was this morning and was normal.  Denies fever/chills, chest pain, shortness of breath, extremity numbness/weakness, vomiting, diarrhea, hematochezia/melena, vaginal discharge/bleeding, urinary symptoms, cough, or any other complaints.      Past Medical History:  Diagnosis Date  . Anxiety   . Chronic anemia   . Chronic hypertension complicating or reason for care during pregnancy 01/10/2012   patient denies/ unable to remove  . Complication of anesthesia    pt states she was given too much anesthesia takes a long time to wake up  . Depression    history of depression- none now  . Dysmenorrhea   . Heartburn in pregnancy   . 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 Maternal anemia complicating pregnancy, childbirth, or the puerperium 01/10/2012  . Morbidly obese ([place]) 01/10/2012  . MTHFR mutation ([place])   . PONV (postoperative nausea and vomiting)   . S/P cesarean section (11/12, rpt, suspect macrosomia) 01/10/2012    Patient Active Problem List   Diagnosis Date Noted  . Weight gain 10/23/2014  . Depression 08/18/2013  . 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 Chronic anemia 01/11/2012  . S/P cesarean section (11/12, rpt & BTL) 01/10/2012  . Chronic hypertension complicating or reason for care during pregnancy 01/10/2012  . Morbidly obese ([place]) 01/10/2012  . LBBB (left bundle branch block) 09/25/2011  . MVP (mitral valve prolapse) 09/25/2011    Past Surgical History:  Procedure Laterality Date  . ABDOMINAL HYSTERECTOMY    . CARDIAC CATHETERIZATION  11-07-2002   The left anterior descending artery is smooth and normal. There are several moderate sized diagonal branch which are normal. The left circumflex artery is normal. It is a very large vessel. The right coronary artery is large and dominant. EF 65%. Smooth and normal coronary arteries. Normal left ventricular systolic function.   Marland Kitchen CARDIOVASCULAR STRESS TEST  2004   revealed anterior apical ischemia.  Marland Kitchen CESAREAN SECTION  6/08   Dr. Ronita Hipps  . CESAREAN SECTION WITH BILATERAL TUBAL LIGATION  01/09/2012   Procedure: CESAREAN SECTION WITH BILATERAL TUBAL LIGATION;  Surgeon: Princess Bruins, MD;  Location: [facility];  Service: Obstetrics;  Laterality: Bilateral;  Repeat C/S   EDD: 01/11/12  . CYSTOSCOPY N/A 04/07/2013   Procedure: CYSTOSCOPY;  Surgeon: Lyman Speller, MD;  Location: [facility];  Service: Gynecology;  Laterality: N/A;  . GASTRIC BYPASS  2003   with cholecystectomy, in [place]  . NASAL SEPTOPLASTY W/ TURBINOPLASTY Bilateral 02/24/2015   Procedure: NASAL SEPTOPLASTY WITH endoscopic bilateral middle turbinate  and bilateral partial inferior turbinate reduction;  Surgeon: Margaretha Sheffield, MD;  Location: [facility];  Service: ENT;  Laterality: Bilateral;  . ROBOTIC ASSISTED TOTAL HYSTERECTOMY Bilateral 04/07/2013   Procedure: ROBOTIC ASSISTED TOTAL HYSTERECTOMY WITH  BILATERAL SALPINGECTOMY;  Surgeon: Lyman Speller, MD;  Location: [facility];  Service: Gynecology;  Laterality: Bilateral;  extra time for BMI  . TUBAL LIGATION       OB History    Gravida  4    Para  2   Term  2   Preterm  0   AB  2   Living  2     SAB  2   TAB  0   Ectopic  0   Multiple  0   Live Births  2            Home Medications    Prior to Admission medications   Medication Sig Start Date End Date Taking? Authorizing Provider  buPROPion (WELLBUTRIN XL) 300 MG 24 hr tablet Take 1 tablet (300 mg total) by mouth daily. 04/10/18  Yes Salvadore Dom, MD  cetirizine (ZYRTEC) 10 MG tablet Take 10 mg by mouth daily.   Yes [provider]  cyanocobalamin 2000 MCG tablet Take 2,000 mcg by mouth daily.   Yes [provider]  Iron-Vitamin C (VITRON-C PO) Take 1 tablet by mouth daily.   Yes [provider]  metoprolol tartrate (LOPRESSOR) 25 MG tablet Take 25 mg by mouth 2 (two) times daily.   Yes [provider]  zinc gluconate 50 MG tablet Take 100 mg by mouth daily.   Yes [provider]  cephALEXin (KEFLEX) 500 MG capsule Take 1 capsule (500 mg total) by mouth 4 (four) times daily for 10 days. 07/28/18 08/07/18  Kiwana Deblasi C, PA-C  HYDROcodone-acetaminophen (NORCO/VICODIN) 5-325 MG tablet Take 1-2 tablets by mouth every 6 (six) hours as needed for severe pain. 07/28/18   Huong Luthi C, PA-C  ondansetron (ZOFRAN ODT) 4 MG disintegrating tablet Take 1 tablet (4 mg total) by mouth every 8 (eight) hours as needed for nausea or vomiting. 07/28/18   Randol Zumstein, Helane Gunther, PA-C    Family History No family history on file.  Social History Social History   Tobacco Use  . Smoking status: Never Smoker  . Smokeless tobacco: Never Used  Substance Use Topics  . Alcohol use: No  . Drug use: No     Allergies   Adhesive [tape] and Amoxicillin   Review of Systems Review of Systems  Constitutional: Negative for chills, diaphoresis and fever.  Respiratory: Negative for cough and shortness of breath.   Gastrointestinal: Positive for abdominal pain and nausea. Negative for blood in stool, constipation, diarrhea and vomiting.   Genitourinary: Positive for flank pain. Negative for difficulty urinating, dysuria, frequency, hematuria, vaginal bleeding and vaginal discharge.  Musculoskeletal: Positive for back pain.  Neurological: Negative for dizziness, weakness and numbness.  All other systems reviewed and are negative.    Physical Exam Updated Vital Signs BP (!) 160/103   Pulse 86   Temp 98.7 F (37.1 C) (Oral)   Resp 20   Ht 5\' 4"  (1.626 m)   Wt (!) 158.8 kg   LMP 02/28/2013   SpO2 94%   BMI 60.08 kg/m   Physical Exam Vitals signs and nursing note reviewed.  Constitutional:      General: She is not in acute distress.    Appearance: She is well-developed. She is obese. She is not diaphoretic.  HENT:     Head:  Normocephalic and atraumatic.     Mouth/Throat:     Mouth: Mucous membranes are moist.     Pharynx: Oropharynx is clear.  Eyes:     Conjunctiva/sclera: Conjunctivae normal.  Neck:     Musculoskeletal: Neck supple.  Cardiovascular:     Rate and Rhythm: Normal rate and regular rhythm.     Pulses: Normal pulses.          Radial pulses are 2+ on the right side and 2+ on the left side.       Posterior tibial pulses are 2+ on the right side and 2+ on the left side.     Heart sounds: Normal heart sounds.     Comments: Tactile temperature in the extremities appropriate and equal bilaterally. Pulmonary:     Effort: Pulmonary effort is normal. No respiratory distress.     Breath sounds: Normal breath sounds.  Abdominal:     Palpations: Abdomen is soft.     Tenderness: There is abdominal tenderness in the left lower quadrant. There is no guarding.     Comments: Tenderness extends into the left flank.  Musculoskeletal:       Back:     Right lower leg: No edema.     Left lower leg: No edema.  Lymphadenopathy:     Cervical: No cervical adenopathy.  Skin:    General: Skin is warm and dry.  Neurological:     Mental Status: She is alert.     Comments: Sensation grossly intact to light touch  in the extremities.  Grip strengths equal bilaterally.  Strength 5/5 in all extremities. No gait disturbance. Coordination intact. Cranial nerves III-XII grossly intact. No facial droop.   Psychiatric:        Mood and Affect: Mood and affect normal.        Speech: Speech normal.        Behavior: Behavior normal.      ED Treatments / Results  Labs (all labs ordered are listed, but only abnormal results are displayed) Labs Reviewed  COMPREHENSIVE METABOLIC PANEL - Abnormal; Notable for the following components:      Result Value   Glucose, Bld 108 (*)    All other components within normal limits  URINALYSIS, ROUTINE W REFLEX MICROSCOPIC - Abnormal; Notable for the following components:   APPearance HAZY (*)    Specific Gravity, Urine >1.046 (*)    All other components within normal limits  URINE CULTURE  NOVEL CORONAVIRUS, NAA (HOSPITAL ORDER, SEND-OUT TO REF LAB)  CBC WITH DIFFERENTIAL/PLATELET  LIPASE, BLOOD    EKG None  Radiology Ct Abdomen Pelvis W Contrast  Result Date: 07/28/2018 CLINICAL DATA:  Onset left lower quadrant pain radiating into the back last night. EXAM: CT ABDOMEN AND PELVIS WITH CONTRAST TECHNIQUE: Multidetector CT imaging of the abdomen and pelvis was performed using the standard protocol following bolus administration of intravenous contrast. CONTRAST:  100 mL OMNIPAQUE IOHEXOL 300 MG/ML  SOLN COMPARISON:  CT abdomen and pelvis 03/05/2009. FINDINGS: Lower chest: Patchy airspace disease is seen in the right middle lobe and anterior right lower lobe. Lung bases otherwise clear. No pleural or pericardial effusion. Hepatobiliary: No focal liver abnormality is seen. Status post cholecystectomy. No biliary dilatation. Pancreas: Unremarkable. No pancreatic ductal dilatation or surrounding inflammatory changes. Spleen: Normal in size without focal abnormality. Adrenals/Urinary Tract: Ovoid areas of decreased contrast enhancement are seen in the upper pole of the left  kidney. The kidneys otherwise appear normal. Ureters and urinary bladder are  normal in appearance. The adrenal glands appear normal. Stomach/Bowel: The patient is status post gastric bypass without evidence of complication. The stomach and small bowel are otherwise normal in appearance. The colon and appendix appear normal. Vascular/Lymphatic: No significant vascular findings are present. No enlarged abdominal or pelvic lymph nodes. Reproductive: Status post hysterectomy. No adnexal masses. Other: Very small fat containing umbilical hernia noted. Musculoskeletal: No acute or focal abnormality. Degenerative disc disease L5-S1 noted. IMPRESSION: Areas of decreased contrast enhancement in the upper pole of the left kidney have an appearance most suggestive of pyelonephritis. Patchy right middle and lower lobe airspace disease worrisome for pneumonia. Electronically Signed   By: Inge Rise M.D.   On: 07/28/2018 10:04    Procedures Procedures (including critical care time)  Medications Ordered in ED Medications  cefTRIAXone (ROCEPHIN) 1 g in sodium chloride 0.9 % 100 mL IVPB (1 g Intravenous New Bag/Given 07/28/18 1147)  ketorolac (TORADOL) 30 MG/ML injection 30 mg (30 mg Intravenous Given 07/28/18 0813)  ondansetron (ZOFRAN) injection 4 mg (4 mg Intravenous Given 07/28/18 0813)  sodium chloride 0.9 % bolus 1,000 mL (1,000 mLs Intravenous New Bag/Given 07/28/18 0813)  iohexol (OMNIPAQUE) 300 MG/ML solution 100 mL (100 mLs Intravenous Contrast Given 07/28/18 0929)     Initial Impression / Assessment and Plan / ED Course  I have reviewed the triage vital signs and the nursing notes.  Pertinent labs & imaging results that were available during my care of the patient were reviewed by me and considered in my medical decision making (see chart for details).  Clinical Course as of Jul 28 1150  Sun Jul 28, 2018  1035 Patient states her pain is well controlled.   [SJ]  2423 Spoke with Apolonio Schneiders, pharmacist.   Confirms patient has had other cephalosporins without issue in the past.  It would be acceptable to treat this patient with ceftriaxone and Keflex.   [SJ]    Clinical Course User Index [SJ] Aidynn Polendo C, PA-C        Patient presents with abdominal pain. Patient is nontoxic appearing, afebrile, not tachycardic, not tachypneic, not hypotensive, maintains excellent SPO2 on room air. Alternative diagnoses were considered including, but not limited to, dissection, ischemia, infarct, but thought less likely based on patient presentation, pain onset and description, physical exam findings, and consideration of risk factors.  Patient does not present with findings suspicious for surgical abdomen.  CT shows evidence of pyelonephritis in the left kidney.  Despite her listed allergy to amoxicillin, she states she has had Keflex in the past without issue. Incidental finding on the CT makes observation of a patchy airspace abnormality.  Patient has no cough, fever, shortness of breath, chest pain, or symptoms on the right at all.  A subclinical viral infection, such as COVID-19, is a possibility and patient was informed of this possibility.  Atelectasis is also a consideration.  For this reason, patient was given incentive spirometer with instructions.  The patient was given instructions for home care as well as return precautions. Patient voices understanding of these instructions, accepts the plan, and is comfortable with discharge.  Findings and plan of care discussed with Blanchie Dessert, MD.   Vitals:   07/28/18 0842 07/28/18 0900 07/28/18 0915 07/28/18 1018  BP:  (!) 145/86 (!) 151/96 (!) 148/77  Pulse: 67 63 63 64  Resp:      Temp:      TempSrc:      SpO2: 97% 99% 97% 99%  Weight:  Height:         Final Clinical Impressions(s) / ED Diagnoses   Final diagnoses:  Pyelonephritis    ED Discharge Orders         Ordered    cephALEXin (KEFLEX) 500 MG capsule  4 times daily      07/28/18 1047    ondansetron (ZOFRAN ODT) 4 MG disintegrating tablet  Every 8 hours PRN     07/28/18 1047    HYDROcodone-acetaminophen (NORCO/VICODIN) 5-325 MG tablet  Every 6 hours PRN     07/28/18 1047           Lorayne Bender, PA-C 07/28/18 1153    Blanchie Dessert, MD 07/29/18 2148

## 2018-07-28 NOTE — Discharge Instructions (Addendum)
Pyelonephritis  There is evidence of an infection in the kidney called pyelonephritis.  Pain/Fever:  Antiinflammatory medications: Take 600 mg of ibuprofen every 6 hours or 440 mg (over the counter dose) to 500 mg (prescription dose) of naproxen every 12 hours for the next 3 days. After this time, these medications may be used as needed for pain. Take these medications with food to avoid upset stomach. Choose only one of these medications, do not take them together. Acetaminophen (generic for Tylenol): Should you continue to have additional pain while taking the ibuprofen or naproxen, you may add in acetaminophen as needed. Your daily total maximum amount of acetaminophen from all sources should be limited to 4000mg /day for persons without liver problems, or 2000mg /day for those with liver problems.  Nausea/vomiting: Use the ondansetron (generic for Zofran) for nausea or vomiting.  This medication may not prevent all vomiting or nausea, but can help facilitate better hydration. Things that can help with nausea/vomiting also include peppermint/menthol candies, vitamin B12, and ginger.  Antibiotics: Please take all of your antibiotics until finished!   You may develop abdominal discomfort or diarrhea from the antibiotic.  You may help offset this with probiotics which you can buy or get in yogurt. Do not eat or take the probiotics until 2 hours after your antibiotic.   Hydration: Symptoms of any other illness will be intensified and complicated by dehydration. Dehydration can also extend the duration of symptoms. Dehydration typically causes its own symptoms including lightheadedness, nausea, headaches, fatigue increased thirst, and generally feeling unwell. Drink plenty of fluids and get plenty of rest. You should be drinking at least half a liter of water every hour or two to stay hydrated. Electrolyte drinks (ex. Gatorade, Powerade, Pedialyte) are also encouraged. You should be drinking enough fluids  to make your urine light yellow, almost clear. If this is not the case, you are not drinking enough water.  Follow-up: Most instances of pyelonephritis may be followed up upon by a primary care provider.  Should symptoms fail to begin to resolve after a few days or they fail to resolve by the end of the antibiotic course, follow-up with a urologist.  Return: Return to the emergency department for significantly worsening pain, inability or significant difficulty urinating, uncontrolled vomiting, spreading pain, or any other major concerns.  There is also evidence of a abnormality in the appearance of the right middle and lower lung.  This could represent a subclinical pneumonia, such as COVID-19, or a noninfectious cause, such as atelectasis. Spirometry: Uses spirometer with initial goal of 1000 mL. Use this throughout the day.

## 2018-07-28 NOTE — ED Triage Notes (Signed)
Pt arrives via POV with reports of LLQ abd pain that radiates to her back starting last night. States it feels like childbirth pain. Endorses nausea with no vomiting.

## 2018-07-28 NOTE — ED Notes (Signed)
Pt given water and crackers  

## 2018-07-29 LAB — NOVEL CORONAVIRUS, NAA (HOSP ORDER, SEND-OUT TO REF LAB; TAT 18-24 HRS): SARS-CoV-2, NAA: NOT DETECTED

## 2018-07-29 LAB — URINE CULTURE

## 2019-02-25 ENCOUNTER — Encounter: Payer: Self-pay | Admitting: Emergency Medicine

## 2019-02-25 ENCOUNTER — Ambulatory Visit
Admission: EM | Admit: 2019-02-25 | Discharge: 2019-02-25 | Disposition: A | Discharge status: Home / Self Care | Payer: BC Managed Care – PPO | Attending: Emergency Medicine | Admitting: Emergency Medicine

## 2019-02-25 ENCOUNTER — Other Ambulatory Visit: Payer: Self-pay

## 2019-02-25 DIAGNOSIS — U071 COVID-19: Secondary | ICD-10-CM | POA: Diagnosis not present

## 2019-02-25 DIAGNOSIS — R05 Cough: Secondary | ICD-10-CM | POA: Diagnosis not present

## 2019-02-25 MED ORDER — PREDNISONE 20 MG PO TABS: 20.0000 mg | ORAL_TABLET | Freq: Every day | ORAL | 0 refills | Status: DC

## 2019-02-25 NOTE — ED Provider Notes (Signed)
[name]    CSN: [id number] Arrival date & time: 02/25/19  1121      History   Chief Complaint Chief Complaint  Patient presents with  . COVID    HPI [name] is a 43 y.o. female tested positive for Covid 12/22 presenting for evaluation for treatment of cough, dyspnea on exertion, fatigue.  Reporting good oral intake, appetite.  Most of patient's symptoms have resolved: Had flulike symptoms from 12/18-12/24 at which time she developed a cough.  Endorsing clear sputum production without blood.  Has tried OTC cold medications without significant relief.  Past Medical History:  Diagnosis Date  . Anxiety   . Chronic anemia   . Chronic hypertension complicating or reason for care during pregnancy 01/10/2012   patient denies/ unable to remove  . Complication of anesthesia    pt states she was given too much anesthesia takes a long time to wake up  . Depression    history of depression- none now  . Dysmenorrhea   . Heartburn in pregnancy   . 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 Maternal anemia complicating pregnancy, childbirth, or the puerperium 01/10/2012  . Morbidly obese ([place]) 01/10/2012  . MTHFR mutation ([place])   . PONV (postoperative nausea and vomiting)   . S/P cesarean section (11/12, rpt, suspect macrosomia) 01/10/2012    Patient Active Problem List   Diagnosis Date Noted  . Weight gain 10/23/2014  . Depression 08/18/2013  . 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 Chronic anemia 01/11/2012  . S/P cesarean section (11/12, rpt & BTL) 01/10/2012  . Chronic hypertension complicating or reason for care during pregnancy 01/10/2012  . Morbidly obese ([place]) 01/10/2012  . LBBB (left bundle branch block) 09/25/2011  . MVP (mitral valve prolapse) 09/25/2011    Past Surgical History:  Procedure Laterality Date  . ABDOMINAL HYSTERECTOMY    . CARDIAC  CATHETERIZATION  11-07-2002   The left anterior descending artery is smooth and normal. There are several moderate sized diagonal branch which are normal. The left circumflex artery is normal. It is a very large vessel. The right coronary artery is large and dominant. EF 65%. Smooth and normal coronary arteries. Normal left ventricular systolic function.   Marland Kitchen CARDIOVASCULAR STRESS TEST  2004   revealed anterior apical ischemia.  Marland Kitchen CESAREAN SECTION  6/08   Dr. Ronita Hipps  . CESAREAN SECTION WITH BILATERAL TUBAL LIGATION  01/09/2012   Procedure: CESAREAN SECTION WITH BILATERAL TUBAL LIGATION;  Surgeon: Princess Bruins, MD;  Location: [facility];  Service: Obstetrics;  Laterality: Bilateral;  Repeat C/S   EDD: 01/11/12  . CYSTOSCOPY N/A 04/07/2013   Procedure: CYSTOSCOPY;  Surgeon: Lyman Speller, MD;  Location: [facility];  Service: Gynecology;  Laterality: N/A;  . GASTRIC BYPASS  2003   with cholecystectomy, in [place]  . NASAL SEPTOPLASTY W/ TURBINOPLASTY Bilateral 02/24/2015   Procedure: NASAL SEPTOPLASTY WITH endoscopic bilateral middle turbinate  and bilateral partial inferior turbinate reduction;  Surgeon: Margaretha Sheffield, MD;  Location: [facility];  Service: ENT;  Laterality: Bilateral;  . ROBOTIC ASSISTED TOTAL HYSTERECTOMY Bilateral 04/07/2013   Procedure: ROBOTIC ASSISTED TOTAL HYSTERECTOMY WITH  BILATERAL SALPINGECTOMY;  Surgeon: Lyman Speller, MD;  Location: [place];  Service: Gynecology;  Laterality: Bilateral;  extra time for BMI  . TUBAL LIGATION      OB History  Gravida  4   Para  2   Term  2   Preterm  0   AB  2   Living  2     SAB  2   TAB  0   Ectopic  0   Multiple  0   Live Births  2            Home Medications    Prior to Admission medications   Medication Sig Start Date End Date Taking? Authorizing Provider  buPROPion (WELLBUTRIN XL) 300 MG 24 hr tablet Take 1 tablet (300 mg total) by mouth daily. 04/10/18   Salvadore Dom, MD  cetirizine (ZYRTEC) 10  MG tablet Take 10 mg by mouth daily.    [provider]  cyanocobalamin 2000 MCG tablet Take 2,000 mcg by mouth daily.    [provider]  HYDROcodone-acetaminophen (NORCO/VICODIN) 5-325 MG tablet Take 1-2 tablets by mouth every 6 (six) hours as needed for severe pain. 07/28/18   Joy, Shawn C, PA-C  Iron-Vitamin C (VITRON-C PO) Take 1 tablet by mouth daily.    [provider]  metoprolol tartrate (LOPRESSOR) 25 MG tablet Take 25 mg by mouth 2 (two) times daily.    [provider]  ondansetron (ZOFRAN ODT) 4 MG disintegrating tablet Take 1 tablet (4 mg total) by mouth every 8 (eight) hours as needed for nausea or vomiting. 07/28/18   Joy, Shawn C, PA-C  predniSONE (DELTASONE) 20 MG tablet Take 1 tablet (20 mg total) by mouth daily. 02/25/19   Hall-Potvin, [place], PA-C  zinc gluconate 50 MG tablet Take 100 mg by mouth daily.    [provider]    Family History History reviewed. No pertinent family history.  Social History Social History   Tobacco Use  . Smoking status: Never Smoker  . Smokeless tobacco: Never Used  Substance Use Topics  . Alcohol use: No  . Drug use: No     Allergies   Adhesive [tape] and Amoxicillin   Review of Systems Review of Systems  Constitutional: Positive for fatigue. Negative for fever.  HENT: Negative for congestion, dental problem, ear pain, facial swelling, hearing loss, sinus pain, sore throat, trouble swallowing and voice change.   Eyes: Negative for photophobia, pain and visual disturbance.  Respiratory: Positive for cough and shortness of breath. Negative for chest tightness and wheezing.   Cardiovascular: Negative for chest pain, palpitations and leg swelling.  Gastrointestinal: Negative for abdominal pain, diarrhea, nausea and vomiting.  Musculoskeletal: Negative for arthralgias and myalgias.  Neurological: Negative for dizziness and headaches.     Physical Exam Triage Vital Signs ED Triage  Vitals  Enc Vitals Group     BP 02/25/19 1211 (!) 173/110     Pulse Rate 02/25/19 1211 92     Resp 02/25/19 1211 (!) 22     Temp 02/25/19 1211 98.2 F (36.8 C)     Temp Source 02/25/19 1211 Temporal     SpO2 02/25/19 1211 98 %     Weight --      Height --      Head Circumference --      Peak Flow --      Pain Score 02/25/19 1212 0     Pain Loc --      Pain Edu? --      Excl. in [place]? --    No data found.  Updated Vital Signs BP (!) 160/97 (BP Location: Left Arm)   Pulse 86  Temp 98.2 F (36.8 C) (Temporal)   Resp (!) 22   LMP 02/28/2013   SpO2 98%   Visual Acuity Right Eye Distance:   Left Eye Distance:   Bilateral Distance:    Right Eye Near:   Left Eye Near:    Bilateral Near:     Physical Exam Constitutional:      General: She is not in acute distress.    Appearance: She is not toxic-appearing or diaphoretic.  HENT:     Head: Normocephalic and atraumatic.     Mouth/Throat:     Mouth: Mucous membranes are moist.     Pharynx: Oropharynx is clear.  Eyes:     General: No scleral icterus.    Pupils: Pupils are equal, round, and reactive to light.  Cardiovascular:     Rate and Rhythm: Normal rate.  Pulmonary:     Effort: Pulmonary effort is normal. No respiratory distress.     Breath sounds: No wheezing.  Musculoskeletal:        General: Normal range of motion.     Right lower leg: No edema.     Left lower leg: No edema.  Skin:    Capillary Refill: Capillary refill takes less than 2 seconds.     Coloration: Skin is not jaundiced or pale.  Neurological:     General: No focal deficit present.     Mental Status: She is alert and oriented to person, place, and time.      UC Treatments / Results  Labs (all labs ordered are listed, but only abnormal results are displayed) Labs Reviewed - No data to display  EKG   Radiology No results found.  Procedures Procedures (including critical care time)  Medications Ordered in UC Medications - No data  to display  Initial Impression / Assessment and Plan / UC Course  I have reviewed the triage vital signs and the nursing notes.  Pertinent labs & imaging results that were available during my care of the patient were reviewed by me and considered in my medical decision making (see chart for details).     Patient afebrile, nontoxic in office.  SPO2 98%.  Start prednisone for cough relief.  Return precautions discussed, patient verbalized understanding and is agreeable to plan. Final Clinical Impressions(s) / UC Diagnoses   Final diagnoses:  [id number] virus infection     Discharge Instructions     Take steroid as prescribed.  It is very important to remember that since you have tested positive for Covid you need to be staying home and quarantining to help keep others safe and healthy in the community.  If you would like further evaluation for persistent or worsening symptoms, you may schedule an E-visit or virtual (video) visit throughout the [facility] app or website.  PLEASE NOTE: If you develop severe chest pain or shortness of breath please go to the ER or call 9-1-1 for further evaluation --> DO NOT schedule electronic or virtual visits for this. Please call our office for further guidance / recommendations as needed.    ED Prescriptions    Medication Sig Dispense Auth. Provider   predniSONE (DELTASONE) 20 MG tablet Take 1 tablet (20 mg total) by mouth daily. 5 tablet Hall-Potvin, [place], PA-C     PDMP not reviewed this encounter.   Hall-Potvin, [place], [place] 02/26/19 2233

## 2019-02-25 NOTE — ED Triage Notes (Addendum)
Pt presents to [facility] for cough, nasal congestion and positive COVID test with [facility].  Patient c/o continuing SOB and cough, and severe fatigue doing small ADLs at home.

## 2019-02-25 NOTE — Discharge Instructions (Addendum)
Take steroid as prescribed.  It is very important to remember that since you have tested positive for Covid you need to be staying home and quarantining to help keep others safe and healthy in the community.  If you would like further evaluation for persistent or worsening symptoms, you may schedule an E-visit or virtual (video) visit throughout the [facility] app or website.  PLEASE NOTE: If you develop severe chest pain or shortness of breath please go to the ER or call 9-1-1 for further evaluation --> DO NOT schedule electronic or virtual visits for this. Please call our office for further guidance / recommendations as needed.

## 2019-02-25 NOTE — ED Notes (Signed)
Patient able to ambulate independently  

## 2019-04-22 ENCOUNTER — Other Ambulatory Visit: Payer: Self-pay | Admitting: Obstetrics and Gynecology

## 2019-04-22 DIAGNOSIS — Z1231 Encounter for screening mammogram for malignant neoplasm of breast: Secondary | ICD-10-CM

## 2019-05-20 ENCOUNTER — Other Ambulatory Visit: Payer: Self-pay

## 2019-05-20 ENCOUNTER — Ambulatory Visit
Admission: RE | Admit: 2019-05-20 | Discharge: 2019-05-20 | Disposition: A | Discharge status: Home / Self Care | Payer: BC Managed Care – PPO | Source: Ambulatory Visit | Attending: Obstetrics and Gynecology | Admitting: Obstetrics and Gynecology

## 2019-05-20 DIAGNOSIS — Z1231 Encounter for screening mammogram for malignant neoplasm of breast: Secondary | ICD-10-CM

## 2019-05-30 ENCOUNTER — Other Ambulatory Visit: Payer: Self-pay

## 2019-05-30 ENCOUNTER — Ambulatory Visit
Admission: EM | Admit: 2019-05-30 | Discharge: 2019-05-30 | Disposition: A | Discharge status: Home / Self Care | Payer: BC Managed Care – PPO | Attending: Physician Assistant | Admitting: Physician Assistant

## 2019-05-30 DIAGNOSIS — R109 Unspecified abdominal pain: Secondary | ICD-10-CM | POA: Insufficient documentation

## 2019-05-30 DIAGNOSIS — R399 Unspecified symptoms and signs involving the genitourinary system: Secondary | ICD-10-CM

## 2019-05-30 LAB — POCT URINALYSIS DIP (MANUAL ENTRY)
Bilirubin, UA: NEGATIVE
Blood, UA: NEGATIVE
Glucose, UA: NEGATIVE mg/dL
Ketones, POC UA: NEGATIVE mg/dL
Leukocytes, UA: NEGATIVE
Nitrite, UA: NEGATIVE
Protein Ur, POC: NEGATIVE mg/dL
Spec Grav, UA: 1.015 (ref 1.010–1.025)
Urobilinogen, UA: 0.2 E.U./dL
pH, UA: 7 (ref 5.0–8.0)

## 2019-05-30 MED ORDER — CEPHALEXIN 500 MG PO CAPS: 500.0000 mg | ORAL_CAPSULE | Freq: Three times a day (TID) | ORAL | 0 refills | Status: AC

## 2019-05-30 NOTE — ED Provider Notes (Signed)
[name]    CSN: [id number] Arrival date & time: 05/30/19  1120      History   Chief Complaint Chief Complaint  Patient presents with  . Dysuria  . Abdominal Pain    HPI Avian Ohmer is a 44 y.o. female.   44 year old female comes in for 3 day history of urinary symptoms. Has had urinary frequency, pressure, urinary urgency. Denies dysuria, hematuria. Suprapubic pain that is intermittent, pressure, with worsening during urination. Right flank pain with pressure pain as well. No nausea/vomiting.  Denies fever, chills, flank/back pain. Denies vaginal discharge, itching, spotting. S/p hysterectomy, c-section, gastric bypass.      Past Medical History:  Diagnosis Date  . Anxiety   . Chronic anemia   . Chronic hypertension complicating or reason for care during pregnancy 01/10/2012   patient denies/ unable to remove  . Complication of anesthesia    pt states she was given too much anesthesia takes a long time to wake up  . Depression    history of depression- none now  . Dysmenorrhea   . Heartburn in pregnancy   . 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 Maternal anemia complicating pregnancy, childbirth, or the puerperium 01/10/2012  . Morbidly obese ([place]) 01/10/2012  . MTHFR mutation ([place])   . PONV (postoperative nausea and vomiting)   . S/P cesarean section (11/12, rpt, suspect macrosomia) 01/10/2012    Patient Active Problem List   Diagnosis Date Noted  . Weight gain 10/23/2014  . Depression 08/18/2013  . 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 Chronic anemia 01/11/2012  . S/P cesarean section (11/12, rpt & BTL) 01/10/2012  . Chronic hypertension complicating or reason for care during pregnancy 01/10/2012  . Morbidly obese ([place]) 01/10/2012  . LBBB (left bundle branch block) 09/25/2011  . MVP (mitral valve prolapse) 09/25/2011    Past Surgical  History:  Procedure Laterality Date  . ABDOMINAL HYSTERECTOMY    . CARDIAC CATHETERIZATION  11-07-2002   The left anterior descending artery is smooth and normal. There are several moderate sized diagonal branch which are normal. The left circumflex artery is normal. It is a very large vessel. The right coronary artery is large and dominant. EF 65%. Smooth and normal coronary arteries. Normal left ventricular systolic function.   Marland Kitchen CARDIOVASCULAR STRESS TEST  2004   revealed anterior apical ischemia.  Marland Kitchen CESAREAN SECTION  6/08   Dr. Ronita Hipps  . CESAREAN SECTION WITH BILATERAL TUBAL LIGATION  01/09/2012   Procedure: CESAREAN SECTION WITH BILATERAL TUBAL LIGATION;  Surgeon: Princess Bruins, MD;  Location: [facility];  Service: Obstetrics;  Laterality: Bilateral;  Repeat C/S   EDD: 01/11/12  . CYSTOSCOPY N/A 04/07/2013   Procedure: CYSTOSCOPY;  Surgeon: Lyman Speller, MD;  Location: [facility];  Service: Gynecology;  Laterality: N/A;  . GASTRIC BYPASS  2003   with cholecystectomy, in [place]  . NASAL SEPTOPLASTY W/ TURBINOPLASTY Bilateral 02/24/2015   Procedure: NASAL SEPTOPLASTY WITH endoscopic bilateral middle turbinate  and bilateral partial inferior turbinate reduction;  Surgeon: Margaretha Sheffield, MD;  Location: [facility];  Service: ENT;  Laterality: Bilateral;  . ROBOTIC ASSISTED TOTAL HYSTERECTOMY Bilateral 04/07/2013   Procedure: ROBOTIC ASSISTED TOTAL HYSTERECTOMY WITH  BILATERAL SALPINGECTOMY;  Surgeon: Lyman Speller, MD;  Location: [facility];  Service: Gynecology;  Laterality: Bilateral;  extra time for  BMI  . TUBAL LIGATION      OB History    Gravida  4   Para  2   Term  2   Preterm  0   AB  2   Living  2     SAB  2   TAB  0   Ectopic  0   Multiple  0   Live Births  2            Home Medications    Prior to Admission medications   Medication Sig Start Date End Date Taking? Authorizing Provider  metoprolol tartrate (LOPRESSOR) 25 MG tablet Take 25 mg by mouth 2 (two)  times daily.   Yes [provider]  thyroid (ARMOUR THYROID) 90 MG tablet Armour Thyroid 90 mg tablet  TAKE 1 TABLET BY MOUTH EVERY DAY   Yes [provider]  buPROPion (WELLBUTRIN XL) 150 MG 24 hr tablet Take 150 mg by mouth daily. 03/11/19   [provider]  buPROPion (WELLBUTRIN XL) 300 MG 24 hr tablet Take 1 tablet (300 mg total) by mouth daily. 04/10/18   Salvadore Dom, MD  cephALEXin (KEFLEX) 500 MG capsule Take 1 capsule (500 mg total) by mouth 3 (three) times daily for 7 days. 05/30/19 06/06/19  Ok Edwards, PA-C  cetirizine (ZYRTEC) 10 MG tablet Take 10 mg by mouth daily.    [provider]  cyanocobalamin 2000 MCG tablet Take 2,000 mcg by mouth daily.    [provider]  HYDROcodone-acetaminophen (NORCO/VICODIN) 5-325 MG tablet Take 1-2 tablets by mouth every 6 (six) hours as needed for severe pain. 07/28/18   Joy, Shawn C, PA-C  Iron-Vitamin C (VITRON-C PO) Take 1 tablet by mouth daily.    [provider]  ondansetron (ZOFRAN ODT) 4 MG disintegrating tablet Take 1 tablet (4 mg total) by mouth every 8 (eight) hours as needed for nausea or vomiting. 07/28/18   Joy, Shawn C, PA-C  predniSONE (DELTASONE) 20 MG tablet Take 1 tablet (20 mg total) by mouth daily. 02/25/19   Hall-Potvin, [place], PA-C  zinc gluconate 50 MG tablet Take 100 mg by mouth daily.    [provider]    Family History History reviewed. No pertinent family history.  Social History Social History   Tobacco Use  . Smoking status: Never Smoker  . Smokeless tobacco: Never Used  Substance Use Topics  . Alcohol use: No  . Drug use: No     Allergies   Adhesive [tape] and Amoxicillin   Review of Systems Review of Systems  Reason unable to perform ROS: See HPI as above.     Physical Exam Triage Vital Signs ED Triage Vitals  Enc Vitals Group     BP 05/30/19 1128 (!) 142/90     Pulse Rate 05/30/19 1128 66     Resp 05/30/19 1128 20     Temp  05/30/19 1128 98.4 F (36.9 C)     Temp Source 05/30/19 1128 Oral     SpO2 05/30/19 1128 98 %     Weight --      Height --      Head Circumference --      Peak Flow --      Pain Score 05/30/19 1125 3     Pain Loc --      Pain Edu? --      Excl. in [place]? --    No data found.  Updated Vital Signs BP (!) 142/90 (BP  Location: Left Wrist)   Pulse 66   Temp 98.4 F (36.9 C) (Oral)   Resp 20   LMP 02/28/2013   SpO2 98%   Physical Exam Constitutional:      General: She is not in acute distress.    Appearance: She is well-developed. She is not ill-appearing, toxic-appearing or diaphoretic.  HENT:     Head: Normocephalic and atraumatic.  Eyes:     Conjunctiva/sclera: Conjunctivae normal.     Pupils: Pupils are equal, round, and reactive to light.  Cardiovascular:     Rate and Rhythm: Normal rate and regular rhythm.  Pulmonary:     Effort: Pulmonary effort is normal. No respiratory distress.     Comments: LCTAB Abdominal:     General: Bowel sounds are normal.     Palpations: Abdomen is soft.     Tenderness: There is no abdominal tenderness. There is no right CVA tenderness, left CVA tenderness, guarding or rebound.  Musculoskeletal:     Cervical back: Normal range of motion and neck supple.  Skin:    General: Skin is warm and dry.  Neurological:     Mental Status: She is alert and oriented to person, place, and time.  Psychiatric:        Behavior: Behavior normal.        Judgment: Judgment normal.    UC Treatments / Results  Labs (all labs ordered are listed, but only abnormal results are displayed) Labs Reviewed  URINE CULTURE  POCT URINALYSIS DIP (MANUAL ENTRY)    EKG   Radiology No results found.  Procedures Procedures (including critical care time)  Medications Ordered in UC Medications - No data to display  Initial Impression / Assessment and Plan / UC Course  I have reviewed the triage vital signs and the nursing notes.  Pertinent labs & imaging  results that were available during my care of the patient were reviewed by me and considered in my medical decision making (see chart for details).    44 year old female comes in for 3-day history of urinary symptoms, suprapubic pain, right flank pain. Denies fever. Denies vaginal symptoms.  Urine dipstick negative. However, chart reviewed by me showed patient with pyelonephritis 06/2018, at that time, CT scan showing pyelonephritis, urine dipstick and culture were inconclusive. Patient states symptoms are the same, though less severe. Discussed with patient, currently without any alarming signs, can decrease caffeine intake, and decreased fluid intake from 1 gallon down to 100 ounces, and await urine culture. However, if symptoms worsen, Keflex sent to pharmacy, patient can start taking for possible pyelonephritis. Return precautions given. Patient expresses understanding and agrees to plan.  Final Clinical Impressions(s) / UC Diagnoses   Final diagnoses:  Right flank pain  Symptoms of urinary tract infection   ED Prescriptions    Medication Sig Dispense Auth. Provider   cephALEXin (KEFLEX) 500 MG capsule Take 1 capsule (500 mg total) by mouth 3 (three) times daily for 7 days. 21 capsule Ok Edwards, PA-C     PDMP not reviewed this encounter.   Ok Edwards, PA-C 05/30/19 1231

## 2019-05-30 NOTE — ED Triage Notes (Signed)
Pt c/o urinary frequency, pressure and urgency for approx 3 days. Also reports right flank and abdom pain. Denies fever, chills, n/v/d. Was hospitalized for pyelonephritis approx 6 months ago per pt. Pt denies recent tylenol or ibuprofen use.

## 2019-05-30 NOTE — Discharge Instructions (Addendum)
As discussed, urine right now is negative. However, it was negative last time when you had a kidney infection. Urine culture sent. Decrease caffeine intake, if symptoms not improving/worsening, can start keflex. Monitor for any worsening of symptoms, fever, worsening abdominal pain, nausea/vomiting, flank pain, follow up for reevaluation.

## 2019-06-04 LAB — URINE CULTURE: Culture: >=100000 — AB

## 2019-06-23 ENCOUNTER — Other Ambulatory Visit: Payer: Self-pay

## 2019-06-23 ENCOUNTER — Ambulatory Visit
Admission: EM | Admit: 2019-06-23 | Discharge: 2019-06-23 | Disposition: A | Discharge status: Home / Self Care | Payer: BC Managed Care – PPO | Attending: Physician Assistant | Admitting: Physician Assistant

## 2019-06-23 ENCOUNTER — Encounter: Payer: Self-pay | Admitting: Emergency Medicine

## 2019-06-23 DIAGNOSIS — R002 Palpitations: Secondary | ICD-10-CM | POA: Diagnosis not present

## 2019-06-23 HISTORY — DX: Disorder of thyroid, unspecified: E07.9

## 2019-06-23 NOTE — ED Provider Notes (Signed)
[name]    CSN: [id number] Arrival date & time: 06/23/19  0805      History   Chief Complaint Chief Complaint  Patient presents with  . Hypertension    HPI Sarajo Dunkerson is a 44 y.o. female.   44 year old female with history of palpitations from left bundle branch block, depression, thyroid disease, comes in for hypertension.  Patient takes metoprolol as needed due to palpitations.  States has been worked up in the past, and palpitations was found to be due to left bundle branch block.  Usually, she takes metoprolol nightly.  Last night, noticed more palpitations, and took 3 tablets since 7 PM.  She checked her BP, and was running in the 150s/90s and therefore came in for evaluation.  Denies associated weakness, dizziness, chest pain, shortness of breath.  Denies exertional fatigue, exertional chest pain.  Has dyspnea on exertion at baseline due to deconditioning, denies worsening.  Denies exertional palpitations.  2 cups of coffee in the morning, no other caffeine use.  Denies alcohol use.  Never smoker.  Denies drug use.     Past Medical History:  Diagnosis Date  . Anxiety   . Chronic anemia   . Chronic hypertension complicating or reason for care during pregnancy 01/10/2012   patient denies/ unable to remove  . Complication of anesthesia    pt states she was given too much anesthesia takes a long time to wake up  . Depression    history of depression- none now  . Dysmenorrhea   . Heartburn in pregnancy   . 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 Maternal anemia complicating pregnancy, childbirth, or the puerperium 01/10/2012  . Morbidly obese ([place]) 01/10/2012  . MTHFR mutation ([place])   . PONV (postoperative nausea and vomiting)   . S/P cesarean section (11/12, rpt, suspect macrosomia) 01/10/2012  . Thyroid disease     Patient Active Problem List   Diagnosis Date Noted  . Weight gain 10/23/2014  . Depression 08/18/2013   . 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 Chronic anemia 01/11/2012  . S/P cesarean section (11/12, rpt & BTL) 01/10/2012  . Chronic hypertension complicating or reason for care during pregnancy 01/10/2012  . Morbidly obese ([place]) 01/10/2012  . LBBB (left bundle branch block) 09/25/2011  . MVP (mitral valve prolapse) 09/25/2011    Past Surgical History:  Procedure Laterality Date  . ABDOMINAL HYSTERECTOMY    . CARDIAC CATHETERIZATION  11-07-2002   The left anterior descending artery is smooth and normal. There are several moderate sized diagonal branch which are normal. The left circumflex artery is normal. It is a very large vessel. The right coronary artery is large and dominant. EF 65%. Smooth and normal coronary arteries. Normal left ventricular systolic function.   Marland Kitchen CARDIOVASCULAR STRESS TEST  2004   revealed anterior apical ischemia.  Marland Kitchen CESAREAN SECTION  6/08   Dr. Ronita Hipps  . CESAREAN SECTION WITH BILATERAL TUBAL LIGATION  01/09/2012   Procedure: CESAREAN SECTION WITH BILATERAL TUBAL LIGATION;  Surgeon: Princess Bruins, MD;  Location: [facility];  Service: Obstetrics;  Laterality: Bilateral;  Repeat C/S   EDD: 01/11/12  . CYSTOSCOPY N/A 04/07/2013   Procedure: CYSTOSCOPY;  Surgeon: Lyman Speller, MD;  Location: [facility];  Service: Gynecology;  Laterality: N/A;  . GASTRIC BYPASS  2003   with cholecystectomy,  in [place]  . NASAL SEPTOPLASTY W/ TURBINOPLASTY Bilateral 02/24/2015   Procedure: NASAL SEPTOPLASTY WITH endoscopic bilateral middle turbinate  and bilateral partial inferior turbinate reduction;  Surgeon: Margaretha Sheffield, MD;  Location: [facility];  Service: ENT;  Laterality: Bilateral;  . ROBOTIC ASSISTED TOTAL HYSTERECTOMY Bilateral 04/07/2013   Procedure: ROBOTIC ASSISTED TOTAL HYSTERECTOMY WITH  BILATERAL SALPINGECTOMY;  Surgeon: Lyman Speller, MD;  Location: [facility];  Service: Gynecology;  Laterality: Bilateral;   extra time for BMI  . TUBAL LIGATION      OB History    Gravida  4   Para  2   Term  2   Preterm  0   AB  2   Living  2     SAB  2   TAB  0   Ectopic  0   Multiple  0   Live Births  2            Home Medications    Prior to Admission medications   Medication Sig Start Date End Date Taking? Authorizing Provider  buPROPion (WELLBUTRIN XL) 300 MG 24 hr tablet Take 1 tablet (300 mg total) by mouth daily. 04/10/18  Yes Salvadore Dom, MD  metoprolol tartrate (LOPRESSOR) 25 MG tablet Take 25 mg by mouth 2 (two) times daily.   Yes [provider]  thyroid (ARMOUR THYROID) 90 MG tablet Armour Thyroid 90 mg tablet  TAKE 1 TABLET BY MOUTH EVERY DAY   Yes [provider]  cetirizine (ZYRTEC) 10 MG tablet Take 10 mg by mouth daily.    [provider]  cyanocobalamin 2000 MCG tablet Take 2,000 mcg by mouth daily.    [provider]  Iron-Vitamin C (VITRON-C PO) Take 1 tablet by mouth daily.    [provider]  zinc gluconate 50 MG tablet Take 100 mg by mouth daily.    [provider]    Family History History reviewed. No pertinent family history.  Social History Social History   Tobacco Use  . Smoking status: Never Smoker  . Smokeless tobacco: Never Used  Substance Use Topics  . Alcohol use: No  . Drug use: No     Allergies   Adhesive [tape] and Amoxicillin   Review of Systems Review of Systems  Reason unable to perform ROS: See HPI as above.     Physical Exam Triage Vital Signs ED Triage Vitals  Enc Vitals Group     BP 06/23/19 0831 106/69     Pulse Rate 06/23/19 0816 65     Resp 06/23/19 0816 20     Temp 06/23/19 0816 (!) 97.4 F (36.3 C)     Temp Source 06/23/19 0816 Oral     SpO2 06/23/19 0816 98 %     Weight --      Height --      Head Circumference --      Peak Flow --      Pain Score 06/23/19 0824 0     Pain Loc --      Pain Edu? --      Excl. in [place]? --    No data  found.  Updated Vital Signs BP 119/74 (BP Location: Right Arm) Comment (BP Location): large, on right forearm  Pulse 65   Temp (!) 97.4 F (36.3 C) (Oral)   Resp 20   LMP 02/28/2013   SpO2 98%   Physical Exam Constitutional:      General: She is not in acute  distress.    Appearance: Normal appearance. She is well-developed. She is not toxic-appearing or diaphoretic.  HENT:     Head: Normocephalic and atraumatic.  Eyes:     Conjunctiva/sclera: Conjunctivae normal.     Pupils: Pupils are equal, round, and reactive to light.  Cardiovascular:     Rate and Rhythm: Normal rate and regular rhythm.  Pulmonary:     Effort: Pulmonary effort is normal. No respiratory distress.     Comments: LCTAB Musculoskeletal:     Cervical back: Normal range of motion and neck supple.  Skin:    General: Skin is warm and dry.  Neurological:     Mental Status: She is alert and oriented to person, place, and time.      UC Treatments / Results  Labs (all labs ordered are listed, but only abnormal results are displayed) Labs Reviewed - No data to display  EKG   Radiology No results found.  Procedures Procedures (including critical care time)  Medications Ordered in UC Medications - No data to display  Initial Impression / Assessment and Plan / UC Course  I have reviewed the triage vital signs and the nursing notes.  Pertinent labs & imaging results that were available during my care of the patient were reviewed by me and considered in my medical decision making (see chart for details).    EKG NSR, 62bpm, LBBB, unchanged from prior.  Will have patient document BP twice daily, and follow-up with PCP for further evaluation.  Return precautions given.  Patient expresses understanding and agrees to plan.  Final Clinical Impressions(s) / UC Diagnoses   Final diagnoses:  Palpitations   ED Prescriptions    None     PDMP not reviewed this encounter.   Ok Edwards, PA-C 06/23/19  1025

## 2019-06-23 NOTE — ED Triage Notes (Signed)
Takes metoprolol for heart rhythm.  Patient noticed heart beating hard and took a tablet, later in the evening took another tablet and this morning took another tablet-patient has had 3 tablets since last night at 7pm.  Patient describes the reason for taking medication was because heart beating hard, not irregular  154/98 prior to leaving home this morning 156/100 during the night 154/98 was at beginning of episode last night Reports heart rates as 65-63  Has not contacted pcp Has not seen cardiologist in 18 years Got metoprolol at another clinic

## 2019-06-23 NOTE — Discharge Instructions (Addendum)
EKG unchanged. Continue metoprolol as directed without extra doses. Check your blood pressure twice a day and  record for PCP. Follow up with PCP/cardiology for further evaluation needed. If worsening of symptoms with dizziness, weakness, shortness of breath, chest pain, go to the emergency department for further evaluation.

## 2019-06-23 NOTE — ED Notes (Signed)
Amy yu, pa aware of repeat blood pressure readings at the time of obtaining readings

## 2020-07-09 ENCOUNTER — Other Ambulatory Visit: Payer: Self-pay | Admitting: Obstetrics and Gynecology

## 2020-11-20 ENCOUNTER — Encounter: Payer: Self-pay | Admitting: Oncology

## 2021-01-21 ENCOUNTER — Emergency Department (HOSPITAL_COMMUNITY): Payer: BC Managed Care – PPO

## 2021-01-21 ENCOUNTER — Other Ambulatory Visit: Payer: Self-pay

## 2021-01-21 ENCOUNTER — Encounter (HOSPITAL_COMMUNITY): Payer: Self-pay | Admitting: *Deleted

## 2021-01-21 ENCOUNTER — Emergency Department (HOSPITAL_COMMUNITY)
Admission: EM | Admit: 2021-01-21 | Discharge: 2021-01-21 | Disposition: A | Discharge status: Home / Self Care | Payer: BC Managed Care – PPO | Attending: Emergency Medicine | Admitting: Emergency Medicine

## 2021-01-21 DIAGNOSIS — R079 Chest pain, unspecified: Secondary | ICD-10-CM | POA: Diagnosis present

## 2021-01-21 LAB — BASIC METABOLIC PANEL
Anion gap: 8 (ref 5–15)
BUN: 19 mg/dL (ref 6–20)
CO2: 23 mmol/L (ref 22–32)
Calcium: 9 mg/dL (ref 8.9–10.3)
Chloride: 106 mmol/L (ref 98–111)
Creatinine, Ser: 0.75 mg/dL (ref 0.44–1.00)
GFR, Estimated: >60 mL/min (ref >60)
Glucose, Bld: 107 mg/dL — ABNORMAL HIGH (ref 70–99)
Potassium: 4.5 mmol/L (ref 3.5–5.1)
Sodium: 137 mmol/L (ref 135–145)

## 2021-01-21 LAB — CBC
HCT: 40.4 % (ref 36.0–46.0)
Hemoglobin: 13.3 g/dL (ref 12.0–15.0)
MCH: 28.9 pg (ref 26.0–34.0)
MCHC: 32.9 g/dL (ref 30.0–36.0)
MCV: 87.8 fL (ref 80.0–100.0)
Platelets: 222 10*3/uL (ref 150–400)
RBC: 4.6 MIL/uL (ref 3.87–5.11)
RDW: 12.9 % (ref 11.5–15.5)
WBC: 8 10*3/uL (ref 4.0–10.5)
nRBC: 0 % (ref 0.0–0.2)

## 2021-01-21 LAB — I-STAT BETA HCG BLOOD, ED (MC, WL, AP ONLY): I-stat hCG, quantitative: <5 m[IU]/mL (ref <5)

## 2021-01-21 LAB — TROPONIN I (HIGH SENSITIVITY): Troponin I (High Sensitivity): 8 ng/L (ref <18)

## 2021-01-21 NOTE — ED Triage Notes (Signed)
The pt is c/o chest pain for one hour no other complaints

## 2021-01-21 NOTE — ED Notes (Signed)
RN attempted to draw second troponin x2 and was unsuccessful. Second nurse was called to attempt, pt declined. RN educated pt about the importance of the blood draw, pt continued to refuse. MD made aware.

## 2021-01-21 NOTE — ED Notes (Signed)
Pt left AMA. RN educated pt about the importance of having labs drawn. Pt refused and signed AMA form.

## 2021-01-21 NOTE — ED Provider Notes (Signed)
[facility] EMERGENCY DEPARTMENT Provider Note   CSN: [id number] Arrival date & time: 01/21/21  1752     History Chief Complaint  Patient presents with   Chest Pain    [name] is a 45 y.o. female.  Patient presents to ER chief complaint left-sided chest pain.  She stated started about an hour or 2 prior to arrival described as sharp and aching pain starting from the left chest rating to the left shoulder.  Pain is made worse when she reaches out with her left hand or takes a deep breath.  No associated vomiting or cough or diarrhea no shortness of breath.  Currently at rest she states she has no chest pain.  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      Past Medical History:  Diagnosis Date   Anxiety    Chronic anemia    Chronic hypertension complicating or reason for care during pregnancy 01/10/2012   patient denies/ unable to remove   Complication of anesthesia    pt states she was given too much anesthesia takes a long time to wake up   Depression    history of depression- none now   Dysmenorrhea    Heartburn in pregnancy    LBBB (left bundle branch block)    pt states she was born with it   Maternal anemia complicating pregnancy, childbirth, or the puerperium 01/10/2012   Morbidly obese ([place]) 01/10/2012   MTHFR mutation    PONV (postoperative nausea and vomiting)    S/P cesarean section (11/12, rpt, suspect macrosomia) 01/10/2012   Thyroid disease     Patient Active Problem List   Diagnosis Date Noted   Weight gain 10/23/2014   Depression 08/18/2013   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 There are several moderate sized diagonal branch which are normal. The left circumflex artery is normal. It is a very large vessel. The right coronary artery is large and dominant. EF 65%. Smooth and normal coronary arteries. Normal left ventricular systolic function.    CARDIOVASCULAR STRESS TEST  2004   revealed anterior apical ischemia.   CESAREAN SECTION  6/08   Dr. Ronita Hipps   CESAREAN SECTION WITH BILATERAL TUBAL LIGATION  01/09/2012   Procedure: CESAREAN SECTION WITH BILATERAL TUBAL LIGATION;  Surgeon: Princess Bruins, MD;  Location: [facility];  Service: Obstetrics;  Laterality: Bilateral;  Repeat C/S   EDD: 01/11/12   CYSTOSCOPY N/A 04/07/2013   Procedure: CYSTOSCOPY;  Surgeon: Lyman Speller, MD;  Location: [facility];  Service: Gynecology;  Laterality: N/A;   GASTRIC BYPASS  2003   with cholecystectomy, in [place]   NASAL SEPTOPLASTY W/ TURBINOPLASTY Bilateral 02/24/2015   Procedure: NASAL SEPTOPLASTY WITH endoscopic bilateral middle turbinate  and bilateral partial inferior turbinate reduction;  Surgeon: Margaretha Sheffield, MD;  Location: [facility];  Service: ENT;  Laterality: Bilateral;  ROBOTIC ASSISTED TOTAL HYSTERECTOMY Bilateral 04/07/2013   Procedure: ROBOTIC ASSISTED TOTAL HYSTERECTOMY WITH  BILATERAL SALPINGECTOMY;  Surgeon: Lyman Speller, MD;  Location: [facility];  Service: Gynecology;  Laterality: Bilateral;  extra time for BMI   TUBAL LIGATION       OB History     Gravida  4   Para  2   Term  2   Preterm  0   AB  2   Living  2      SAB  2   IAB  0   Ectopic  0   Multiple  0   Live Births  2           No family history on file.  Social History   Tobacco Use   Smoking status: Never   Smokeless tobacco: Never   Vaping Use   Vaping Use: Never used  Substance Use Topics   Alcohol use: No   Drug use: No    Home Medications Prior to Admission medications   Medication Sig Start Date End Date Taking? Authorizing Provider  cetirizine (ZYRTEC) 10 MG tablet Take 10 mg by mouth daily as needed for allergies.   Yes [provider]  doxycycline (VIBRAMYCIN) 100 MG capsule Take 100 mg by mouth every 12 (twelve) hours.   Yes [provider]  metoprolol tartrate (LOPRESSOR) 25 MG tablet Take 25 mg by mouth 2 (two) times daily.   Yes [provider]  buPROPion (WELLBUTRIN XL) 300 MG 24 hr tablet Take 1 tablet (300 mg total) by mouth daily. Patient not taking: Reported on 01/21/2021 04/10/18   Salvadore Dom, MD  thyroid (ARMOUR) 90 MG tablet Armour Thyroid 90 mg tablet  TAKE 1 TABLET BY MOUTH EVERY DAY Patient not taking: Reported on 01/21/2021    [provider]    Allergies    Adhesive [tape] and Amoxicillin  Review of Systems   Review of Systems  Constitutional:  Negative for fever.  HENT:  Negative for ear pain.   Eyes:  Negative for pain.  Respiratory:  Negative for cough.   Cardiovascular:  Positive for chest pain.  Gastrointestinal:  Negative for abdominal pain.  Genitourinary:  Negative for flank pain.  Musculoskeletal:  Negative for back pain.  Skin:  Negative for rash.  Neurological:  Negative for headaches.   Physical Exam Updated Vital Signs BP (!) 180/79   Pulse 77   Temp 98.5 F (36.9 C) (Oral)   Resp 18   Ht 5\' 4"  (1.626 m)   Wt (!) 158.8 kg   LMP 02/28/2013   SpO2 100%   BMI 60.09 kg/m   Physical Exam Constitutional:      General: She is not in acute distress.    Appearance: Normal appearance.  HENT:     Head: Normocephalic.     Nose: Nose normal.  Eyes:     Extraocular Movements: Extraocular movements intact.  Cardiovascular:     Rate and Rhythm: Normal rate.  Pulmonary:     Effort: Pulmonary effort is normal.   Musculoskeletal:        General: Normal range of motion.     Cervical back: Normal range of motion.     Comments: No unilateral leg swelling or edema noted.  Negative Homans' sign.  Neurological:     General: No focal deficit present.     Mental Status: She is alert. Mental status is at baseline.    ED Results / Procedures / Treatments  Labs (all labs ordered are listed, but only abnormal results are displayed) Labs Reviewed  BASIC METABOLIC PANEL - Abnormal; Notable for the following components:      Result Value   Glucose, Bld 107 (*)    All other components within normal limits  CBC  D-DIMER, QUANTITATIVE  I-STAT BETA HCG BLOOD, ED ([facility], [facility], [facility] ONLY)  TROPONIN I (HIGH SENSITIVITY)  TROPONIN I (HIGH SENSITIVITY)    EKG EKG Interpretation  Date/Time:  Friday January 21 2021 18:00:03 EST Ventricular Rate:  91 PR Interval:  178 QRS Duration: 144 QT Interval:  398 QTC Calculation: 489 R Axis:   -14 Text Interpretation: Normal sinus rhythm Left bundle branch block Abnormal ECG Confirmed by Thamas Jaegers ([id number]) on 01/21/2021 7:16:52 PM  Radiology DG Chest 2 View  Result Date: 01/21/2021 CLINICAL DATA:  Chest pain. EXAM: CHEST - 2 VIEW COMPARISON:  None. FINDINGS: The heart size and mediastinal contours are within normal limits. Both lungs are clear. The visualized skeletal structures are unremarkable. There are surgical clips in the upper abdomen. IMPRESSION: No active cardiopulmonary disease. Electronically Signed   By: Ronney Asters M.D.   On: 01/21/2021 19:16    Procedures Procedures   Medications Ordered in ED Medications - No data to display  ED Course  I have reviewed the triage vital signs and the nursing notes.  Pertinent labs & imaging results that were available during my care of the patient were reviewed by me and considered in my medical decision making (see chart for details).    MDM Rules/Calculators/A&P                           EKG shows sinus  rhythm no ST elevations or depressions.  Lateral branch block pattern seen consistent with prior EKGs.  Labs otherwise unremarkable troponin is negative.  I advised the patient that we will pursue a second troponin and a D-dimer given her risk factors, however she refused.  Risk and benefits discussed patient has decision-making capacity continues to decline tests.  She prefers to follow-up on an outpatient basis.  Advised her the risk of missing a acute MI pulm embolism or other life-threatening illness.  All questions were answered.  Patient declines further evaluation.  She is invited to return anytime should she change her mind, otherwise follow-up with her outpatient team primary care doctor or cardiologist within 2 to 3 days.   Final Clinical Impression(s) / ED Diagnoses Final diagnoses:  Chest pain, unspecified type    Rx / DC Orders ED Discharge Orders     None        Luna Fuse, MD 01/21/21 2129

## 2021-10-12 ENCOUNTER — Encounter: Payer: Self-pay | Admitting: Oncology

## 2021-10-20 ENCOUNTER — Encounter: Payer: Self-pay | Admitting: Oncology

## 2022-03-22 ENCOUNTER — Other Ambulatory Visit: Payer: Self-pay | Admitting: Family Medicine

## 2022-03-22 ENCOUNTER — Ambulatory Visit
Admission: RE | Admit: 2022-03-22 | Discharge: 2022-03-22 | Disposition: A | Discharge status: Home / Self Care | Payer: Self-pay | Source: Ambulatory Visit | Attending: Family Medicine | Admitting: Family Medicine

## 2022-03-22 ENCOUNTER — Ambulatory Visit
Admission: RE | Admit: 2022-03-22 | Discharge: 2022-03-22 | Disposition: A | Discharge status: Home / Self Care | Payer: Self-pay | Attending: Family Medicine | Admitting: Family Medicine

## 2022-03-22 ENCOUNTER — Encounter: Payer: Self-pay | Admitting: Oncology

## 2022-03-22 DIAGNOSIS — R059 Cough, unspecified: Secondary | ICD-10-CM

## 2022-03-24 ENCOUNTER — Encounter: Payer: Self-pay | Admitting: Oncology

## 2022-05-04 ENCOUNTER — Encounter: Payer: Self-pay | Admitting: Emergency Medicine

## 2022-05-04 ENCOUNTER — Emergency Department: Payer: Medicaid Other

## 2022-05-04 ENCOUNTER — Inpatient Hospital Stay: Payer: Medicaid Other

## 2022-05-04 ENCOUNTER — Inpatient Hospital Stay
Admission: EM | Admit: 2022-05-04 | Discharge: 2022-05-06 | DRG: 194 | Disposition: A | Discharge status: Home / Self Care | Payer: Medicaid Other | Attending: Internal Medicine | Admitting: Internal Medicine

## 2022-05-04 ENCOUNTER — Other Ambulatory Visit: Payer: Self-pay

## 2022-05-04 DIAGNOSIS — F32A Depression, unspecified: Secondary | ICD-10-CM | POA: Diagnosis present

## 2022-05-04 DIAGNOSIS — J180 Bronchopneumonia, unspecified organism: Principal | ICD-10-CM | POA: Diagnosis present

## 2022-05-04 DIAGNOSIS — Z881 Allergy status to other antibiotic agents status: Secondary | ICD-10-CM

## 2022-05-04 DIAGNOSIS — I5042 Chronic combined systolic (congestive) and diastolic (congestive) heart failure: Secondary | ICD-10-CM | POA: Diagnosis present

## 2022-05-04 DIAGNOSIS — R6520 Severe sepsis without septic shock: Secondary | ICD-10-CM | POA: Diagnosis not present

## 2022-05-04 DIAGNOSIS — Z9884 Bariatric surgery status: Secondary | ICD-10-CM

## 2022-05-04 DIAGNOSIS — Z7901 Long term (current) use of anticoagulants: Secondary | ICD-10-CM

## 2022-05-04 DIAGNOSIS — E039 Hypothyroidism, unspecified: Secondary | ICD-10-CM | POA: Diagnosis present

## 2022-05-04 DIAGNOSIS — J189 Pneumonia, unspecified organism: Secondary | ICD-10-CM | POA: Diagnosis present

## 2022-05-04 DIAGNOSIS — E872 Acidosis, unspecified: Secondary | ICD-10-CM | POA: Diagnosis present

## 2022-05-04 DIAGNOSIS — Z91048 Other nonmedicinal substance allergy status: Secondary | ICD-10-CM | POA: Diagnosis not present

## 2022-05-04 DIAGNOSIS — I447 Left bundle-branch block, unspecified: Secondary | ICD-10-CM | POA: Diagnosis present

## 2022-05-04 DIAGNOSIS — Z148 Genetic carrier of other disease: Secondary | ICD-10-CM

## 2022-05-04 DIAGNOSIS — R042 Hemoptysis: Secondary | ICD-10-CM | POA: Diagnosis present

## 2022-05-04 DIAGNOSIS — A419 Sepsis, unspecified organism: Secondary | ICD-10-CM | POA: Diagnosis present

## 2022-05-04 DIAGNOSIS — I10 Essential (primary) hypertension: Secondary | ICD-10-CM | POA: Diagnosis not present

## 2022-05-04 DIAGNOSIS — I2609 Other pulmonary embolism with acute cor pulmonale: Secondary | ICD-10-CM | POA: Diagnosis not present

## 2022-05-04 DIAGNOSIS — Z820 Family history of epilepsy and other diseases of the nervous system: Secondary | ICD-10-CM

## 2022-05-04 DIAGNOSIS — R052 Subacute cough: Secondary | ICD-10-CM

## 2022-05-04 DIAGNOSIS — Z880 Allergy status to penicillin: Secondary | ICD-10-CM | POA: Diagnosis not present

## 2022-05-04 DIAGNOSIS — Z6841 Body Mass Index (BMI) 40.0 and over, adult: Secondary | ICD-10-CM | POA: Diagnosis not present

## 2022-05-04 DIAGNOSIS — I2699 Other pulmonary embolism without acute cor pulmonale: Secondary | ICD-10-CM | POA: Diagnosis not present

## 2022-05-04 DIAGNOSIS — F418 Other specified anxiety disorders: Secondary | ICD-10-CM | POA: Diagnosis not present

## 2022-05-04 DIAGNOSIS — Z1152 Encounter for screening for COVID-19: Secondary | ICD-10-CM | POA: Diagnosis not present

## 2022-05-04 DIAGNOSIS — F419 Anxiety disorder, unspecified: Secondary | ICD-10-CM | POA: Diagnosis present

## 2022-05-04 DIAGNOSIS — Z79899 Other long term (current) drug therapy: Secondary | ICD-10-CM

## 2022-05-04 DIAGNOSIS — I110 Hypertensive heart disease with heart failure: Secondary | ICD-10-CM | POA: Diagnosis present

## 2022-05-04 DIAGNOSIS — J329 Chronic sinusitis, unspecified: Secondary | ICD-10-CM | POA: Diagnosis present

## 2022-05-04 DIAGNOSIS — J40 Bronchitis, not specified as acute or chronic: Secondary | ICD-10-CM | POA: Diagnosis present

## 2022-05-04 DIAGNOSIS — R079 Chest pain, unspecified: Principal | ICD-10-CM

## 2022-05-04 LAB — CBC
HCT: 39.5 % (ref 36.0–46.0)
Hemoglobin: 12.5 g/dL (ref 12.0–15.0)
MCH: 26.3 pg (ref 26.0–34.0)
MCHC: 31.6 g/dL (ref 30.0–36.0)
MCV: 83 fL (ref 80.0–100.0)
Platelets: 372 10*3/uL (ref 150–400)
RBC: 4.76 MIL/uL (ref 3.87–5.11)
RDW: 14.5 % (ref 11.5–15.5)
WBC: 13.1 10*3/uL — ABNORMAL HIGH (ref 4.0–10.5)
nRBC: 0 % (ref 0.0–0.2)

## 2022-05-04 LAB — BASIC METABOLIC PANEL
Anion gap: 10 (ref 5–15)
BUN: 11 mg/dL (ref 6–20)
CO2: 22 mmol/L (ref 22–32)
Calcium: 9 mg/dL (ref 8.9–10.3)
Chloride: 104 mmol/L (ref 98–111)
Creatinine, Ser: 0.67 mg/dL (ref 0.44–1.00)
GFR, Estimated: >60 mL/min (ref >60)
Glucose, Bld: 129 mg/dL — ABNORMAL HIGH (ref 70–99)
Potassium: 4.1 mmol/L (ref 3.5–5.1)
Sodium: 136 mmol/L (ref 135–145)

## 2022-05-04 LAB — TROPONIN I (HIGH SENSITIVITY)
Troponin I (High Sensitivity): 9 ng/L (ref <18)
Troponin I (High Sensitivity): 8 ng/L (ref <18)

## 2022-05-04 LAB — HEPATIC FUNCTION PANEL
ALT: 58 U/L — ABNORMAL HIGH (ref 0–44)
AST: 73 U/L — ABNORMAL HIGH (ref 15–41)
Albumin: 3.7 g/dL (ref 3.5–5.0)
Alkaline Phosphatase: 96 U/L (ref 38–126)
Bilirubin, Direct: 0.1 mg/dL (ref 0.0–0.2)
Indirect Bilirubin: 0.8 mg/dL (ref 0.3–0.9)
Total Bilirubin: 0.9 mg/dL (ref 0.3–1.2)
Total Protein: 7.8 g/dL (ref 6.5–8.1)

## 2022-05-04 LAB — RESP PANEL BY RT-PCR (RSV, FLU A&B, COVID)  RVPGX2
Influenza A by PCR: NEGATIVE
Influenza B by PCR: NEGATIVE
Resp Syncytial Virus by PCR: NEGATIVE
SARS Coronavirus 2 by RT PCR: NEGATIVE

## 2022-05-04 LAB — PROCALCITONIN: Procalcitonin: <0.1 ng/mL

## 2022-05-04 LAB — D-DIMER, QUANTITATIVE: D-Dimer, Quant: 0.84 ug/mL-FEU — ABNORMAL HIGH (ref 0.00–0.50)

## 2022-05-04 LAB — APTT: aPTT: 22 seconds — ABNORMAL LOW (ref 24–36)

## 2022-05-04 LAB — HIV ANTIBODY (ROUTINE TESTING W REFLEX): HIV Screen 4th Generation wRfx: NONREACTIVE

## 2022-05-04 LAB — LIPASE, BLOOD: Lipase: 26 U/L (ref 11–51)

## 2022-05-04 LAB — HEPARIN LEVEL (UNFRACTIONATED): Heparin Unfractionated: 0.29 IU/mL — ABNORMAL LOW (ref 0.30–0.70)

## 2022-05-04 LAB — LACTIC ACID, PLASMA
Lactic Acid, Venous: 2.3 mmol/L (ref 0.5–1.9)
Lactic Acid, Venous: 1.9 mmol/L (ref 0.5–1.9)

## 2022-05-04 LAB — PROTIME-INR
INR: 1 (ref 0.8–1.2)
Prothrombin Time: 13.1 seconds (ref 11.4–15.2)

## 2022-05-04 MED ORDER — HEPARIN SODIUM (PORCINE) 5000 UNIT/ML IJ SOLN: 5000.0000 [IU] | Freq: Three times a day (TID) | INTRAMUSCULAR | Status: DC

## 2022-05-04 MED ORDER — HEPARIN BOLUS VIA INFUSION
1500.0000 [IU] | Freq: Once | INTRAVENOUS | Status: AC
  Administered 2022-05-04: 1500 [IU] via INTRAVENOUS
  Filled 2022-05-04: qty 1500

## 2022-05-04 MED ORDER — SODIUM CHLORIDE 0.9 % IV SOLN
500.0000 mg | INTRAVENOUS | Status: DC
  Administered 2022-05-04 – 2022-05-05 (×2): 500 mg via INTRAVENOUS
  Filled 2022-05-04 (×2): qty 5

## 2022-05-04 MED ORDER — SODIUM CHLORIDE 0.9 % IV SOLN
1.0000 g | Freq: Once | INTRAVENOUS | Status: AC
  Administered 2022-05-04: 1 g via INTRAVENOUS
  Filled 2022-05-04: qty 10

## 2022-05-04 MED ORDER — METOPROLOL TARTRATE 25 MG PO TABS
25.0000 mg | ORAL_TABLET | Freq: Two times a day (BID) | ORAL | Status: DC
  Administered 2022-05-04 – 2022-05-06 (×5): 25 mg via ORAL
  Filled 2022-05-04 (×5): qty 1

## 2022-05-04 MED ORDER — HEPARIN (PORCINE) 25000 UT/250ML-% IV SOLN
1900.0000 [IU]/h | INTRAVENOUS | Status: DC
  Administered 2022-05-04: 1900 [IU]/h via INTRAVENOUS
  Administered 2022-05-04: 1700 [IU]/h via INTRAVENOUS
  Filled 2022-05-04 (×2): qty 250

## 2022-05-04 MED ORDER — LORATADINE 10 MG PO TABS
10.0000 mg | ORAL_TABLET | Freq: Every day | ORAL | Status: DC
  Administered 2022-05-04 – 2022-05-06 (×3): 10 mg via ORAL
  Filled 2022-05-04 (×3): qty 1

## 2022-05-04 MED ORDER — HYDROMORPHONE HCL 1 MG/ML IJ SOLN: 1.0000 mg | INTRAMUSCULAR | Status: DC | PRN

## 2022-05-04 MED ORDER — IOHEXOL 350 MG/ML SOLN
100.0000 mL | Freq: Once | INTRAVENOUS | Status: AC | PRN
  Administered 2022-05-04: 100 mL via INTRAVENOUS

## 2022-05-04 MED ORDER — HYDRALAZINE HCL 20 MG/ML IJ SOLN: 5.0000 mg | INTRAMUSCULAR | Status: DC | PRN

## 2022-05-04 MED ORDER — DM-GUAIFENESIN ER 30-600 MG PO TB12
1.0000 | ORAL_TABLET | Freq: Two times a day (BID) | ORAL | Status: DC | PRN
  Administered 2022-05-04: 1 via ORAL
  Filled 2022-05-04 (×2): qty 1

## 2022-05-04 MED ORDER — HEPARIN BOLUS VIA INFUSION
6000.0000 [IU] | Freq: Once | INTRAVENOUS | Status: AC
  Administered 2022-05-04: 6000 [IU] via INTRAVENOUS
  Filled 2022-05-04: qty 6000

## 2022-05-04 MED ORDER — OXYCODONE-ACETAMINOPHEN 5-325 MG PO TABS
1.0000 | ORAL_TABLET | ORAL | Status: DC | PRN
  Administered 2022-05-04 – 2022-05-05 (×5): 1 via ORAL
  Filled 2022-05-04 (×5): qty 1

## 2022-05-04 MED ORDER — HYDROMORPHONE HCL 1 MG/ML IJ SOLN
1.0000 mg | Freq: Once | INTRAMUSCULAR | Status: AC
  Administered 2022-05-04: 1 mg via INTRAVENOUS
  Filled 2022-05-04: qty 1

## 2022-05-04 MED ORDER — SODIUM CHLORIDE 0.9 % IV SOLN
2.0000 g | INTRAVENOUS | Status: DC
  Administered 2022-05-05: 2 g via INTRAVENOUS
  Filled 2022-05-04: qty 20

## 2022-05-04 MED ORDER — SODIUM CHLORIDE 0.9 % IV SOLN: 1.0000 g | Freq: Once | INTRAVENOUS | Status: DC

## 2022-05-04 MED ORDER — SODIUM CHLORIDE 0.9 % IV SOLN: INTRAVENOUS | Status: DC

## 2022-05-04 MED ORDER — ONDANSETRON HCL 4 MG/2ML IJ SOLN
4.0000 mg | Freq: Once | INTRAMUSCULAR | Status: AC
  Administered 2022-05-04: 4 mg via INTRAVENOUS
  Filled 2022-05-04: qty 2

## 2022-05-04 MED ORDER — SODIUM CHLORIDE 0.9 % IV SOLN
500.0000 mg | Freq: Once | INTRAVENOUS | Status: DC
  Filled 2022-05-04: qty 5

## 2022-05-04 MED ORDER — ACETAMINOPHEN 325 MG PO TABS: 650.0000 mg | ORAL_TABLET | Freq: Four times a day (QID) | ORAL | Status: DC | PRN

## 2022-05-04 MED ORDER — SODIUM CHLORIDE 0.9 % IV BOLUS
1000.0000 mL | Freq: Once | INTRAVENOUS | Status: AC
  Administered 2022-05-04: 1000 mL via INTRAVENOUS

## 2022-05-04 MED ORDER — SODIUM CHLORIDE 0.9 % IV BOLUS (SEPSIS)
1000.0000 mL | Freq: Once | INTRAVENOUS | Status: AC
  Administered 2022-05-04: 1000 mL via INTRAVENOUS

## 2022-05-04 MED ORDER — SODIUM CHLORIDE 0.9 % IV SOLN
12.5000 mg | Freq: Three times a day (TID) | INTRAVENOUS | Status: DC | PRN
  Administered 2022-05-04: 12.5 mg via INTRAVENOUS
  Filled 2022-05-04: qty 12.5

## 2022-05-04 MED ORDER — ONDANSETRON HCL 4 MG/2ML IJ SOLN: 4.0000 mg | Freq: Three times a day (TID) | INTRAMUSCULAR | Status: DC | PRN

## 2022-05-04 MED ORDER — ALBUTEROL SULFATE (2.5 MG/3ML) 0.083% IN NEBU
3.0000 mL | INHALATION_SOLUTION | RESPIRATORY_TRACT | Status: DC | PRN
  Administered 2022-05-04: 3 mL via RESPIRATORY_TRACT
  Filled 2022-05-04: qty 3

## 2022-05-04 NOTE — H&P (Addendum)
History and Physical    Ruchoma Fury [id number] DOB: 1976-01-31 DOA: 05/04/2022  Referring MD/NP/PA:   PCP: Patient, No Pcp Per   Patient coming from:  The patient is coming from home.     Chief Complaint: chest pain, cough  HPI: [name] is a 47 y.o. female with medical history significant of morbid obesity BMI 68.66, s/p of Roux-en-Y, hypothyroidism, depression with anxiety, MTHFR mutation, left bundle blockade, who presents with chest pain and cough.   Patient states that she has cough for almost 3 months.  She has nasal congestion.  She had finished 3 rounds of antibiotics in the past 3 months.  She still has cough with pink-colored mucus production.  She has mild shortness breath, no respiratory distress.  Patient states that since yesterday she started having chest pain, which is located in the right side of chest, constant, sharp, severe, 10 out of 10 in severity, radiating to the back.  Denies fever or chills.  Patient does not have nausea, vomiting, diarrhea or abdominal pain.  No symptoms of UTI.  No recent fall or head injury.  Denies any dark stool or rectal bleeding.  No tenderness in the calf areas.  Patient states that she does not have history of hypertension.  She is taking metoprolol due to left bundle blockage.  Data reviewed independently and ED Course: pt was found to have positive D-dimer 0.84, troponin 9--> 8, lactic acid 2.3, negative PCR for COVID, flu and RSV, WBC 13.1, GFR> 60.  Temperature normal, blood pressure 135/66, heart rate 108, 91, 38, 24, oxygen sat 94% on 2 L oxygen.  Chest x-ray showed bronchitis change.  Patient is admitted to telemetry bed as inpatient.  CTA: 1. Suboptimal pulmonary artery contrast timing and limited vessel detail exacerbated by respiratory motion. No central or lobar pulmonary embolus. But a subsegmental pulmonary embolus would be difficult to exclude (see #2).   2. Peripheral and wedge-shaped 4-5 cm area of consolidation in  the inferior right upper lobe. However, superimposed multifocal additional bilateral upper lobe and right middle lobe peribronchial opacity which most resembles Acute Bronchopneumonia. Therefore favor bilateral Pneumonia. However, if there are no clinical signs or symptoms of infection then a pulmonary infarct could not be excluded.   3. No pleural effusion. Borderline to mild cardiomegaly.   4. Chronic postoperative changes in the upper abdomen.   EKG: I have personally reviewed.  Sinus rhythm, QTc 492, heart rate 103, left bundle blockage, LAD, poor R wave progression   Review of Systems:   General: no fevers, chills, no body weight gain, has fatigue HEENT: no blurry vision, hearing changes or sore throat Respiratory: has dyspnea, coughing, no wheezing CV:  has chest pain, no palpitations GI: no nausea, vomiting, abdominal pain, diarrhea, constipation GU: no dysuria, burning on urination, increased urinary frequency, hematuria  Ext: no leg edema Neuro: no unilateral weakness, numbness, or tingling, no vision change or hearing loss Skin: no rash, no skin tear. MSK: No muscle spasm, no deformity, no limitation of range of movement in spin Heme: No easy bruising.  Travel history: No recent long distant travel.   Allergy:  Allergies  Allergen Reactions   Adhesive [Tape] Hives   Amoxicillin Hives    Childhood allergy. Pt. Can tolerate Cephalosporins.    Past Medical History:  Diagnosis Date   Anxiety    Chronic anemia    Chronic hypertension complicating or reason for care during pregnancy 01/10/2012   patient denies/ unable to remove  Complication of anesthesia    pt states she was given too much anesthesia takes a long time to wake up   Depression    history of depression- none now   Dysmenorrhea    Heartburn in pregnancy    LBBB (left bundle branch block)    pt states she was born with it   Maternal anemia complicating pregnancy, childbirth, or the puerperium  01/10/2012   Morbidly obese ([place]) 01/10/2012   MTHFR mutation    PONV (postoperative nausea and vomiting)    S/P cesarean section (11/12, rpt, suspect macrosomia) 01/10/2012   Thyroid disease     Past Surgical History:  Procedure Laterality Date   ABDOMINAL HYSTERECTOMY     CARDIAC CATHETERIZATION  11-07-2002   The left anterior descending artery is smooth and normal. There are several moderate sized diagonal branch which are normal. The left circumflex artery is normal. It is a very large vessel. The right coronary artery is large and dominant. EF 65%. Smooth and normal coronary arteries. Normal left ventricular systolic function.    CARDIOVASCULAR STRESS TEST  2004   revealed anterior apical ischemia.   CESAREAN SECTION  6/08   Dr. Ronita Hipps   CESAREAN SECTION WITH BILATERAL TUBAL LIGATION  01/09/2012   Procedure: CESAREAN SECTION WITH BILATERAL TUBAL LIGATION;  Surgeon: Princess Bruins, MD;  Location: [facility];  Service: Obstetrics;  Laterality: Bilateral;  Repeat C/S   EDD: 01/11/12   CYSTOSCOPY N/A 04/07/2013   Procedure: CYSTOSCOPY;  Surgeon: Lyman Speller, MD;  Location: [facility];  Service: Gynecology;  Laterality: N/A;   GASTRIC BYPASS  2003   with cholecystectomy, in [place]   NASAL SEPTOPLASTY W/ TURBINOPLASTY Bilateral 02/24/2015   Procedure: NASAL SEPTOPLASTY WITH endoscopic bilateral middle turbinate  and bilateral partial inferior turbinate reduction;  Surgeon: Margaretha Sheffield, MD;  Location: [facility];  Service: ENT;  Laterality: Bilateral;   ROBOTIC ASSISTED TOTAL HYSTERECTOMY Bilateral 04/07/2013   Procedure: ROBOTIC ASSISTED TOTAL HYSTERECTOMY WITH  BILATERAL SALPINGECTOMY;  Surgeon: Lyman Speller, MD;  Location: [facility];  Service: Gynecology;  Laterality: Bilateral;  extra time for BMI   TUBAL LIGATION      Social History:  reports that she has never smoked. She has never used smokeless tobacco. She reports that she does not drink alcohol and does not use drugs.  Family  History:  Family History  Problem Relation Age of Onset   Alzheimer's disease Father      Prior to Admission medications   Medication Sig Start Date End Date Taking? Authorizing Provider  buPROPion (WELLBUTRIN XL) 300 MG 24 hr tablet Take 1 tablet (300 mg total) by mouth daily. Patient not taking: Reported on 01/21/2021 04/10/18   Salvadore Dom, MD  cetirizine (ZYRTEC) 10 MG tablet Take 10 mg by mouth daily as needed for allergies.    [provider]  doxycycline (VIBRAMYCIN) 100 MG capsule Take 100 mg by mouth every 12 (twelve) hours.    [provider]  metoprolol tartrate (LOPRESSOR) 25 MG tablet Take 25 mg by mouth 2 (two) times daily.    [provider]  thyroid (ARMOUR) 90 MG tablet Armour Thyroid 90 mg tablet  TAKE 1 TABLET BY MOUTH EVERY DAY Patient not taking: Reported on 01/21/2021    [provider]    Physical Exam: Vitals:   05/04/22 0730 05/04/22 0800 05/04/22 0830 05/04/22 0921  BP: 135/66 124/60 134/73 (!) 142/64  Pulse:   87 90  Resp:  16 (!) 22 14  Temp:    98.4 F (36.9 C)  TempSrc:      SpO2:   96% 100%  Weight:      Height:       General: Not in acute distress HEENT:       Eyes: PERRL, EOMI, no scleral icterus.       ENT: No discharge from the ears and nose, no pharynx injection, no tonsillar enlargement.        Neck: No JVD, no bruit, no mass felt. Heme: No neck lymph node enlargement. Cardiac: S1/S2, RRR, No gallops or rubs. Respiratory: No rales, wheezing, rhonchi or rubs. GI: Soft, nondistended, nontender, no rebound pain, no organomegaly, BS present. GU: No hematuria Ext: No pitting leg edema bilaterally. 1+DP/PT pulse bilaterally. Musculoskeletal: No joint deformities, No joint redness or warmth, no limitation of ROM in spin. Skin: No rashes.  Neuro: Alert, oriented X3, cranial nerves II-XII grossly intact, moves all extremities normally.  Psych: Patient is not psychotic, no suicidal or hemocidal  ideation.  Labs on Admission: I have personally reviewed following labs and imaging studies  CBC: Recent Labs  Lab 05/04/22 0501  WBC 13.1*  HGB 12.5  HCT 39.5  MCV 83.0  PLT [id number]   Basic Metabolic Panel: Recent Labs  Lab 05/04/22 0501  NA 136  K 4.1  CL 104  CO2 22  GLUCOSE 129*  BUN 11  CREATININE 0.67  CALCIUM 9.0   GFR: Estimated Creatinine Clearance: 147.8 mL/min (by C-G formula based on SCr of 0.67 mg/dL). Liver Function Tests: Recent Labs  Lab 05/04/22 0501  AST 73*  ALT 58*  ALKPHOS 96  BILITOT 0.9  PROT 7.8  ALBUMIN 3.7   Recent Labs  Lab 05/04/22 0501  LIPASE 26   No results for input(s): "AMMONIA" in the last 168 hours. Coagulation Profile: Recent Labs  Lab 05/04/22 0501  INR 1.0   Cardiac Enzymes: No results for input(s): "CKTOTAL", "CKMB", "CKMBINDEX", "TROPONINI" in the last 168 hours. BNP (last 3 results) No results for input(s): "PROBNP" in the last 8760 hours. HbA1C: No results for input(s): "HGBA1C" in the last 72 hours. CBG: No results for input(s): "GLUCAP" in the last 168 hours. Lipid Profile: No results for input(s): "CHOL", "HDL", "LDLCALC", "TRIG", "CHOLHDL", "LDLDIRECT" in the last 72 hours. Thyroid Function Tests: No results for input(s): "TSH", "T4TOTAL", "FREET4", "T3FREE", "THYROIDAB" in the last 72 hours. Anemia Panel: No results for input(s): "VITAMINB12", "FOLATE", "FERRITIN", "TIBC", "IRON", "RETICCTPCT" in the last 72 hours. Urine analysis:    Component Value Date/Time   COLORURINE YELLOW 07/28/2018 0805   APPEARANCEUR HAZY (A) 07/28/2018 0805   LABSPEC >1.046 (H) 07/28/2018 0805   PHURINE 5.0 07/28/2018 0805   GLUCOSEU NEGATIVE 07/28/2018 0805   HGBUR NEGATIVE 07/28/2018 0805   BILIRUBINUR negative 05/30/2019 1137   BILIRUBINUR neg 10/07/2014 1402   KETONESUR negative 05/30/2019 1137   KETONESUR NEGATIVE 07/28/2018 0805   PROTEINUR negative 05/30/2019 1137   PROTEINUR NEGATIVE 07/28/2018 0805    UROBILINOGEN 0.2 05/30/2019 1137   UROBILINOGEN 1.0 03/05/2009 0159   NITRITE Negative 05/30/2019 1137   NITRITE NEGATIVE 07/28/2018 0805   LEUKOCYTESUR Negative 05/30/2019 1137   LEUKOCYTESUR NEGATIVE 07/28/2018 0805   Sepsis Labs: '@LABRCNTIP'$ (procalcitonin:4,lacticidven:4) ) Recent Results (from the past 240 hour(s))  Resp panel by RT-PCR (RSV, Flu A&B, Covid) Anterior Nasal Swab     Status: None   Collection Time: 05/04/22  5:09 AM   Specimen: Anterior Nasal Swab  Result Value Ref Range Status   SARS  Coronavirus 2 by RT PCR NEGATIVE NEGATIVE Final    Comment: (NOTE) SARS-CoV-2 target nucleic acids are NOT DETECTED.  The SARS-CoV-2 RNA is generally detectable in upper respiratory specimens during the acute phase of infection. The lowest concentration of SARS-CoV-2 viral copies this assay can detect is 138 copies/mL. A negative result does not preclude SARS-Cov-2 infection and should not be used as the sole basis for treatment or other patient management decisions. A negative result may occur with  improper specimen collection/handling, submission of specimen other than nasopharyngeal swab, presence of viral mutation(s) within the areas targeted by this assay, and inadequate number of viral copies(<138 copies/mL). A negative result must be combined with clinical observations, patient history, and epidemiological information. The expected result is Negative.  Fact Sheet for Patients:  EntrepreneurPulse.com.au  Fact Sheet for Healthcare Providers:  IncredibleEmployment.be  This test is no t yet approved or cleared by the [place] FDA and  has been authorized for detection and/or diagnosis of SARS-CoV-2 by FDA under an Emergency Use Authorization (EUA). This EUA will remain  in effect (meaning this test can be used) for the duration of the COVID-19 declaration under Section 564(b)(1) of the Act, 21 U.S.C.section 360bbb-3(b)(1), unless  the authorization is terminated  or revoked sooner.       Influenza A by PCR NEGATIVE NEGATIVE Final   Influenza B by PCR NEGATIVE NEGATIVE Final    Comment: (NOTE) The Xpert Xpress SARS-CoV-2/FLU/RSV plus assay is intended as an aid in the diagnosis of influenza from Nasopharyngeal swab specimens and should not be used as a sole basis for treatment. Nasal washings and aspirates are unacceptable for Xpert Xpress SARS-CoV-2/FLU/RSV testing.  Fact Sheet for Patients: EntrepreneurPulse.com.au  Fact Sheet for Healthcare Providers: IncredibleEmployment.be  This test is not yet approved or cleared by the [place] FDA and has been authorized for detection and/or diagnosis of SARS-CoV-2 by FDA under an Emergency Use Authorization (EUA). This EUA will remain in effect (meaning this test can be used) for the duration of the COVID-19 declaration under Section 564(b)(1) of the Act, 21 U.S.C. section 360bbb-3(b)(1), unless the authorization is terminated or revoked.     Resp Syncytial Virus by PCR NEGATIVE NEGATIVE Final    Comment: (NOTE) Fact Sheet for Patients: EntrepreneurPulse.com.au  Fact Sheet for Healthcare Providers: IncredibleEmployment.be  This test is not yet approved or cleared by the [place] FDA and has been authorized for detection and/or diagnosis of SARS-CoV-2 by FDA under an Emergency Use Authorization (EUA). This EUA will remain in effect (meaning this test can be used) for the duration of the COVID-19 declaration under Section 564(b)(1) of the Act, 21 U.S.C. section 360bbb-3(b)(1), unless the authorization is terminated or revoked.  Performed at [facility], [address] [place], [place] [address]      Radiological Exams on Admission: CT Angio Chest PE W and/or Wo Contrast  Result Date: 05/04/2022 CLINICAL DATA:  47 year old female with chest pain and abnormal  D-dimer. EXAM: CT ANGIOGRAPHY CHEST WITH CONTRAST TECHNIQUE: Multidetector CT imaging of the chest was performed using the standard protocol during bolus administration of intravenous contrast. Multiplanar CT image reconstructions and MIPs were obtained to evaluate the vascular anatomy. RADIATION DOSE REDUCTION: This exam was performed according to the departmental dose-optimization program which includes automated exposure control, adjustment of the mA and/or kV according to patient size and/or use of iterative reconstruction technique. CONTRAST:  186m OMNIPAQUE IOHEXOL 350 MG/ML SOLN COMPARISON:  Portable chest x-ray 0546 hours today. CT Abdomen  and Pelvis 07/28/2018. FINDINGS: Cardiovascular: Suboptimal, but adequate contrast bolus timing in the pulmonary arterial tree. Respiratory motion. No central or lobar embolus. No convincing segmental pulmonary artery filling defect, but the subsegmental and distal branches are not well evaluated. Borderline to mild cardiomegaly. No pericardial effusion. Negative visible aorta. Mediastinum/Nodes: No mediastinal mass or lymphadenopathy. Small reactive appearing right hilar lymph nodes. Lungs/Pleura: Intermittent respiratory motion. Major airways are patent. Fairly widespread scattered bilateral upper lobe peribronchial sub solid and solid lung opacity. Plus 4-5 cm area of consolidation in the inferior right upper lobe abutting the minor fissure (series 6, image 61). However, this consolidation is somewhat peripheral and wedge-shaped. No superimposed pleural effusion. Early right middle lobe peribronchial patchy, inflammatory appearing opacity. Lingular relatively spared. Both lower lobes relatively spared with costophrenic angle opacity which more resembles atelectasis. Upper Abdomen: Chronic postoperative changes with partially visible gastrojejunostomy. Absent gallbladder. No free air, free fluid, or dilated bowel in the visible upper abdomen. Musculoskeletal: No acute  osseous abnormality identified. Review of the MIP images confirms the above findings. IMPRESSION: 1. Suboptimal pulmonary artery contrast timing and limited vessel detail exacerbated by respiratory motion. No central or lobar pulmonary embolus. But a subsegmental pulmonary embolus would be difficult to exclude (see #2). 2. Peripheral and wedge-shaped 4-5 cm area of consolidation in the inferior right upper lobe. However, superimposed multifocal additional bilateral upper lobe and right middle lobe peribronchial opacity which most resembles Acute Bronchopneumonia. Therefore favor bilateral Pneumonia. However, if there are no clinical signs or symptoms of infection then a pulmonary infarct could not be excluded. 3. No pleural effusion. Borderline to mild cardiomegaly. 4. Chronic postoperative changes in the upper abdomen. Electronically Signed   By: Genevie Ann M.D.   On: 05/04/2022 07:19   DG Chest Portable 1 View  Result Date: 05/04/2022 CLINICAL DATA:  47 year old female with history of chest pain. Productive cough and bloody sputum. EXAM: PORTABLE CHEST 1 VIEW COMPARISON:  Chest x-ray 03/22/2022. FINDINGS: Lung volumes are low. No consolidative airspace disease. However, there is mild diffuse interstitial prominence and patchy areas of peribronchial cuffing. No pleural effusions. No pneumothorax. No pulmonary nodule or mass noted. Pulmonary vasculature and the cardiomediastinal silhouette are within normal limits. IMPRESSION: 1. Findings are concerning for possible acute bronchitis. Electronically Signed   By: Vinnie Langton M.D.   On: 05/04/2022 06:24      Assessment/Plan Principal Problem:   CAP (community acquired pneumonia) Active Problems:   Severe sepsis (HCC)   Hypothyroidism   LBBB (left bundle branch block)   Depression with anxiety   Morbidly obese ([facility])   Assessment and Plan:  CAP (community acquired pneumonia): CTA is suboptimal pulmonary artery contrast timing and limited  vessel detail exacerbated by respiratory motion, but no central PE. It showed superimposed multifocal additional bilateral upper lobe and right middle lobe peribronchial opacity which most resembles acute Bronchopneumonia.  Patient has mild leukocytosis with WBC 13.1, but no fever.  Patient coughs up pink-colored mucus, indicating possible hemoptysis.  CTA also showed peripheral and wedge-shaped 4-5 cm area of consolidation in the inferior right upper lobe.  Patient is morbidly obese, limiting her movement.  Patient is at high risk of developing blood clot.  Will start antibiotics for possible pneumonia and IV heparin for possible PE.  May need to repeat CTA after 48 hours.  - Admit to tele bed as inpt - IV Rocephin and azithromycin - IV heparin - Mucinex for cough  - Bronchodilators - Urine legionella and S. pneumococcal antigen -  Follow up blood culture x2, sputum culture - will get Procalcitonin and trend lactic acid level - IVF: 1L of NS bolus in ED - F/u lower extremity venous Doppler to rule out DVT  Sepsis due to CAP: pt meets criteria for severe sepsis with WBC 13.1, heart rate 108, RR 38, lactic acid 2.3. -Patient is on antibiotics as above -IV fluid: 2 L normal saline, then 100 cc/h -Trend lactic acid level -Check procalcitonin level --> < 0.10   Hypothyroidism: Patient is not taking medications currently -f/u with PCP  LBBB (left bundle branch block) -Continue home metoprolol  Depression with anxiety: Patient's not taking medications currently.  No suicidal or homicidal ideations. -Observe closely  Morbidly obese ([place]): Body weight 181.4 kg, BMI 68.66 -Encouraged losing weight -Exercise and healthy diet    DVT ppx: on IV Heparin     Code Status: Full code  Family Communication:    Yes, patient's husband   at bed side.    Disposition Plan:  Anticipate discharge back to previous environment  Consults called:  none  Admission status and Level of care: Telemetry  Medical:   as inpt       Dispo: The patient is from: Home              Anticipated d/c is to: Home              Anticipated d/c date is: 2 days              Patient currently is not medically stable to d/c.    Severity of Illness:  The appropriate patient status for this patient is INPATIENT. Inpatient status is judged to be reasonable and necessary in order to provide the required intensity of service to ensure the patient's safety. The patient's presenting symptoms, physical exam findings, and initial radiographic and laboratory data in the context of their chronic comorbidities is felt to place them at high risk for further clinical deterioration. Furthermore, it is not anticipated that the patient will be medically stable for discharge from the hospital within 2 midnights of admission.   * I certify that at the point of admission it is my clinical judgment that the patient will require inpatient hospital care spanning beyond 2 midnights from the point of admission due to high intensity of service, high risk for further deterioration and high frequency of surveillance required.*       Date of Service 05/04/2022      Ivor Costa [facility]   If 7PM-7AM, please contact night-coverage [email or web address] 05/04/2022, 1:15 PM

## 2022-05-04 NOTE — Progress Notes (Signed)
Received patient from ED via stretcher.  Patient is alert and oriented x 4, able to walk to the bed.  Assisted in position of comfort, oxygen at 2L [place].  Spouse at bedside.  Patient states "I have been having this stabbing pain in my right chest since yesterday".  Pain medication given as ordered.  Oriented to room and unit routine.  Educated on plan of care.  Needs addressed, call bell within reach.

## 2022-05-04 NOTE — ED Provider Notes (Signed)
[facility] Provider Note    Event Date/Time   First MD Initiated Contact with Patient 05/04/22 [phone]     (approximate)   History   Chest Pain   HPI  [name] is a 47 y.o. female history of morbid obesity, left bundle branch block, chronic anemia, gastric bypass, cholecystectomy, hysterectomy who presents to the emergency department with complaints of right-sided chest pain worse with deep inspiration that started tonight.  Has had a cough for 3 months.  No fever.  Reports she feels short of breath due to the pain.  No pain in the abdomen.  No vomiting, diarrhea.  No lower extremity swelling or pain.  No known history of PE or DVT.   History provided by patient, husband.    Past Medical History:  Diagnosis Date   Anxiety    Chronic anemia    Chronic hypertension complicating or reason for care during pregnancy 01/10/2012   patient denies/ unable to remove   Complication of anesthesia    pt states she was given too much anesthesia takes a long time to wake up   Depression    history of depression- none now   Dysmenorrhea    Heartburn in pregnancy    LBBB (left bundle branch block)    pt states she was born with it   Maternal anemia complicating pregnancy, childbirth, or the puerperium 01/10/2012   Morbidly obese ([place]) 01/10/2012   MTHFR mutation    PONV (postoperative nausea and vomiting)    S/P cesarean section (11/12, rpt, suspect macrosomia) 01/10/2012   Thyroid disease     Past Surgical History:  Procedure Laterality Date   ABDOMINAL HYSTERECTOMY     CARDIAC CATHETERIZATION  11-07-2002   The left anterior descending artery is smooth and normal. There are several moderate sized diagonal branch which are normal. The left circumflex artery is normal. It is a very large vessel. The right coronary artery is large and dominant. EF 65%. Smooth and normal coronary arteries. Normal left ventricular systolic function.    CARDIOVASCULAR STRESS  TEST  2004   revealed anterior apical ischemia.   CESAREAN SECTION  6/08   Dr. Ronita Hipps   CESAREAN SECTION WITH BILATERAL TUBAL LIGATION  01/09/2012   Procedure: CESAREAN SECTION WITH BILATERAL TUBAL LIGATION;  Surgeon: Princess Bruins, MD;  Location: [facility];  Service: Obstetrics;  Laterality: Bilateral;  Repeat C/S   EDD: 01/11/12   CYSTOSCOPY N/A 04/07/2013   Procedure: CYSTOSCOPY;  Surgeon: Lyman Speller, MD;  Location: [place] [facility];  Service: Gynecology;  Laterality: N/A;   GASTRIC BYPASS  2003   with cholecystectomy, in [place]   NASAL SEPTOPLASTY W/ TURBINOPLASTY Bilateral 02/24/2015   Procedure: NASAL SEPTOPLASTY WITH endoscopic bilateral middle turbinate  and bilateral partial inferior turbinate reduction;  Surgeon: Margaretha Sheffield, MD;  Location: [facility];  Service: ENT;  Laterality: Bilateral;   ROBOTIC ASSISTED TOTAL HYSTERECTOMY Bilateral 04/07/2013   Procedure: ROBOTIC ASSISTED TOTAL HYSTERECTOMY WITH  BILATERAL SALPINGECTOMY;  Surgeon: Lyman Speller, MD;  Location: [facility];  Service: Gynecology;  Laterality: Bilateral;  extra time for BMI   TUBAL LIGATION      MEDICATIONS:  Prior to Admission medications   Medication Sig Start Date End Date Taking? Authorizing Provider  buPROPion (WELLBUTRIN XL) 300 MG 24 hr tablet Take 1 tablet (300 mg total) by mouth daily. Patient not taking: Reported on 01/21/2021 04/10/18   Salvadore Dom, MD  cetirizine (ZYRTEC) 10 MG tablet Take 10 mg by  mouth daily as needed for allergies.    [provider]  doxycycline (VIBRAMYCIN) 100 MG capsule Take 100 mg by mouth every 12 (twelve) hours.    [provider]  metoprolol tartrate (LOPRESSOR) 25 MG tablet Take 25 mg by mouth 2 (two) times daily.    [provider]  thyroid (ARMOUR) 90 MG tablet Armour Thyroid 90 mg tablet  TAKE 1 TABLET BY MOUTH EVERY DAY Patient not taking: Reported on 01/21/2021    [provider]    Physical Exam   Triage Vital Signs: ED  Triage Vitals  Enc Vitals Group     BP 05/04/22 0448 (!) 161/113     Pulse Rate 05/04/22 0448 (!) 108     Resp 05/04/22 0448 (!) 22     Temp 05/04/22 0448 98.3 F (36.8 C)     Temp Source 05/04/22 0448 Oral     SpO2 05/04/22 0448 98 %     Weight 05/04/22 0450 (!) 405 lb (183.7 kg)     Height 05/04/22 0450 '5\' 4"'$  (1.626 m)     Head Circumference --      Peak Flow --      Pain Score 05/04/22 0451 10     Pain Loc --      Pain Edu? --      Excl. in [place]? --     Most recent vital signs: Vitals:   05/04/22 0630 05/04/22 0700  BP: (!) 148/80   Pulse: 91 99  Resp: (!) 0 (!) 38  Temp:    SpO2: 95% 94%    CONSTITUTIONAL: Alert, responds appropriately to questions.  Overly obese, appears very uncomfortable, crying out in pain HEAD: Normocephalic, atraumatic EYES: Conjunctivae clear, pupils appear equal, sclera nonicteric ENT: normal nose; moist mucous membranes NECK: Supple, normal ROM CARD: Regular and tachycardic; S1 and S2 appreciated CHEST:  Chest wall is nontender to palpation.  No crepitus, ecchymosis, erythema, warmth, rash or other lesions present.  Right breast appears normal. RESP: Splinting intermittently, breath sounds clear and equal bilaterally; no wheezes, no rhonchi, no rales, no hypoxia or respiratory distress, speaking full sentences ABD/GI: Non-distended; soft, non-tender, no rebound, no guarding, no peritoneal signs, no tenderness in the upper abdomen, right upper quadrant BACK: The back appears normal EXT: Normal ROM in all joints; no deformity noted, no edema, no calf tenderness are noted calf swelling but exam limited due to body habitus SKIN: Normal color for age and race; warm; no rash on exposed skin NEURO: Moves all extremities equally, normal speech PSYCH: The patient's mood and manner are appropriate.   ED Results / Procedures / Treatments   LABS: (all labs ordered are listed, but only abnormal results are displayed) Labs Reviewed  BASIC METABOLIC  PANEL - Abnormal; Notable for the following components:      Result Value   Glucose, Bld 129 (*)    All other components within normal limits  CBC - Abnormal; Notable for the following components:   WBC 13.1 (*)    All other components within normal limits  HEPATIC FUNCTION PANEL - Abnormal; Notable for the following components:   AST 73 (*)    ALT 58 (*)    All other components within normal limits  D-DIMER, QUANTITATIVE - Abnormal; Notable for the following components:   D-Dimer, Quant 0.84 (*)    All other components within normal limits  RESP PANEL BY RT-PCR (RSV, FLU A&B, COVID)  RVPGX2  LIPASE, BLOOD  TROPONIN I (HIGH SENSITIVITY)  TROPONIN I (HIGH SENSITIVITY)     EKG:  EKG Interpretation  Date/Time:  Thursday May 04 2022 04:53:02 EST Ventricular Rate:  103 PR Interval:  152 QRS Duration: 144 QT Interval:  376 QTC Calculation: 492 R Axis:   -51 Text Interpretation: Sinus tachycardia Left axis deviation Left bundle branch block Abnormal ECG When compared with ECG of 21-Jan-2021 18:00, QRS axis Shifted left Confirmed by Pryor Curia [phone]) on 05/04/2022 5:01:38 AM         RADIOLOGY: My personal review and interpretation of imaging: Chest x-ray shows bronchitis.  CT pending.  I have personally reviewed all radiology reports.   DG Chest Portable 1 View  Result Date: 05/04/2022 CLINICAL DATA:  47 year old female with history of chest pain. Productive cough and bloody sputum. EXAM: PORTABLE CHEST 1 VIEW COMPARISON:  Chest x-ray 03/22/2022. FINDINGS: Lung volumes are low. No consolidative airspace disease. However, there is mild diffuse interstitial prominence and patchy areas of peribronchial cuffing. No pleural effusions. No pneumothorax. No pulmonary nodule or mass noted. Pulmonary vasculature and the cardiomediastinal silhouette are within normal limits. IMPRESSION: 1. Findings are concerning for possible acute bronchitis. Electronically Signed   By: Vinnie Langton  M.D.   On: 05/04/2022 06:24     PROCEDURES:  Critical Care performed: No     .1-3 Lead EKG Interpretation  Performed by: Boluwatife Mutchler, Delice Bison, DO Authorized by: Shifra Swartzentruber, Delice Bison, DO     Interpretation: abnormal     ECG rate:  108   ECG rate assessment: tachycardic     Rhythm: sinus tachycardia     Ectopy: none     Conduction: normal       IMPRESSION / MDM / ASSESSMENT AND PLAN / ED COURSE  I reviewed the triage vital signs and the nursing notes.    Patient here with right-sided chest pain that is worse with deep inspiration.  The patient is on the cardiac monitor to evaluate for evidence of arrhythmia and/or significant heart rate changes.   DIFFERENTIAL DIAGNOSIS (includes but not limited to):   Musculoskeletal pain, rib fracture, pneumonia, PE, less likely ACS   Patient's presentation is most consistent with acute presentation with potential threat to life or bodily function.   PLAN: Will obtain CBC, CMP, lipase, troponin x 2, D-dimer, COVID and flu swabs.  Will give IV fluids, pain and nausea medicine.  Will obtain chest x-ray.   MEDICATIONS GIVEN IN ED: Medications  HYDROmorphone (DILAUDID) injection 1 mg (1 mg Intravenous Given 05/04/22 0513)  ondansetron (ZOFRAN) injection 4 mg (4 mg Intravenous Given 05/04/22 0513)  sodium chloride 0.9 % bolus 1,000 mL (0 mLs Intravenous Stopped 05/04/22 0611)  iohexol (OMNIPAQUE) 350 MG/ML injection 100 mL (100 mLs Intravenous Contrast Given 05/04/22 0658)     ED COURSE: Patient's troponin x 2 negative.  COVID, flu and RSV negative.  Slight leukocytosis of 13,000.  Minimally elevated AST and ALT likely from fatty liver.  She is status postcholecystectomy and has no upper abdominal tenderness on exam but exam somewhat limited due to body habitus.  Normal lipase.  Chest x-ray reviewed and interpreted by myself and the radiologist and is concerning for possible acute bronchitis but no opacity, edema, pneumothorax.  D-dimer elevated.  Will  obtain CTA of the chest.  Will sign out to oncoming ED physician at 7 AM to follow-up on CT results.  If unremarkable and bronchitis is her only finding, anticipate discharge home with antibiotics, pain medication.      CONSULTS: Pending further  workup.   OUTSIDE RECORDS REVIEWED: Reviewed last hematology note in 2019 and last OB/GYN note in 2020.       FINAL CLINICAL IMPRESSION(S) / ED DIAGNOSES   Final diagnoses:  Nonspecific chest pain     Rx / DC Orders   ED Discharge Orders     None        Note:  This document was prepared using Dragon voice recognition software and may include unintentional dictation errors.   Alison Breeding, Delice Bison, DO 05/04/22 [phone]

## 2022-05-04 NOTE — Plan of Care (Signed)

## 2022-05-04 NOTE — Consult Note (Signed)
ANTICOAGULATION CONSULT NOTE - Initial Consult  Pharmacy Consult for heparin infusion Indication: pulmonary embolus  Allergies  Allergen Reactions   Adhesive [Tape] Hives   Amoxicillin Hives    Childhood allergy. Pt. Can tolerate Cephalosporins.    Patient Measurements: Height: '5\' 4"'$  (162.6 cm) Weight: (!) 181.4 kg (400 lb) IBW/kg (Calculated) : 54.7 Heparin Dosing Weight: 102.3 kg  Vital Signs: Temp: 99.1 F (37.3 C) (03/07 1631) BP: 138/80 (03/07 1952) Pulse Rate: 102 (03/07 1952)  Labs: Recent Labs    05/04/22 0501 05/04/22 0647 05/04/22 1001 05/04/22 2126  HGB 12.5  --   --   --   HCT 39.5  --   --   --   PLT 372  --   --   --   APTT  --   --  22*  --   LABPROT 13.1  --   --   --   INR 1.0  --   --   --   HEPARINUNFRC  --   --   --  0.29*  CREATININE 0.67  --   --   --   TROPONINIHS 9 8  --   --      Estimated Creatinine Clearance: 147.8 mL/min (by C-G formula based on SCr of 0.67 mg/dL).   Medical History: Past Medical History:  Diagnosis Date   Anxiety    Chronic anemia    Chronic hypertension complicating or reason for care during pregnancy 01/10/2012   patient denies/ unable to remove   Complication of anesthesia    pt states she was given too much anesthesia takes a long time to wake up   Depression    history of depression- none now   Dysmenorrhea    Heartburn in pregnancy    LBBB (left bundle branch block)    pt states she was born with it   Maternal anemia complicating pregnancy, childbirth, or the puerperium 01/10/2012   Morbidly obese ([place]) 01/10/2012   MTHFR mutation    PONV (postoperative nausea and vomiting)    S/P cesarean section (11/12, rpt, suspect macrosomia) 01/10/2012   Thyroid disease     Medications:  No evidence of PTA AC meds  Assessment: 47 yo F who presents to the ED with chest pain and cough.  D-Dimer 0.84.  CT could not rule out PE. Pharmacy consulted to dose  heparin  Date Time HL Rate/Comment 3/7 2126 0.29 Subtherapeutic / 1700 > 1900 u/hr  Goal of Therapy:  Heparin level 0.3-0.7 units/ml Monitor platelets by anticoagulation protocol: Yes  Plan:  Give 1500 units bolus x1; then increase heparin infusion at 1900 units/hr Check anti-Xa level in 6 hours and daily once consecutively therapeutic. Continue to monitor H&H and platelets daily while on heparin gtt.   [place] Pharmacist 05/04/2022 9:51 PM

## 2022-05-04 NOTE — Progress Notes (Signed)
With orders for NS bolus and continuous IVF. Patient only has one PIV currently with heparin infusion running.  Attempted to start a 2nd PIV access, patient's veins are not visible nor palpable at this time, attempt failed.  IV consult placed.  Dr. Blaine Hamper made aware of limited venous access.  Per Dr. Blaine Hamper, ok to hold heparin infusion to administer NS 1L bolus, then restart upon bolus completion.  [facility] made aware.

## 2022-05-04 NOTE — ED Provider Notes (Signed)
I was asked to follow-up on the results of CT angiogram and troponins.  Troponins flat.  CT angiogram has signs of inflammatory appearing opacity concerning for a pneumonia so I ordered her CAP coverage.  However there was suboptimal contrast bolus timing and a questionable wedge-shaped peripheral consolidation could not rule out PE.  Given her normotension no tachycardia no hypoxemia and no reports of right heart strain on this imaging, I doubt there is a significant PE contributing to her symptoms at this time.  With a subacute cough leading to right-sided chest pain I still think infection is a most likely diagnosis at this time however given the unclear CT angiogram and her ongoing symptoms I discussed with the patient a plan to have her admitted with IV antibiotics for community-acquired pneumonia and ongoing observation in the emergency department for resolution of symptoms, with the option of rescanning if any questions remain about her symptoms being attributable to pulmonary embolism.   Lucillie Garfinkel, MD 05/04/22 [phone]

## 2022-05-04 NOTE — ED Triage Notes (Signed)
Pt assisted out of vehicle, grimacing, moaning; c/o rt sided CP radiating into back since yesterday afternoon; st has had prod cough white/bloody sputum x 2 mos

## 2022-05-04 NOTE — Consult Note (Signed)
ANTICOAGULATION CONSULT NOTE - Initial Consult  Pharmacy Consult for heparin Indication: pulmonary embolus  Allergies  Allergen Reactions   Adhesive [Tape] Hives   Amoxicillin Hives    Childhood allergy. Pt. Can tolerate Cephalosporins.    Patient Measurements: Height: '5\' 4"'$  (162.6 cm) Weight: (!) 181.4 kg (400 lb) IBW/kg (Calculated) : 54.7 Heparin Dosing Weight: 102.3 kg  Vital Signs: Temp: 98.3 F (36.8 C) (03/07 0448) Temp Source: Oral (03/07 0448) BP: 134/73 (03/07 0830) Pulse Rate: 87 (03/07 0830)  Labs: Recent Labs    05/04/22 0501 05/04/22 0647  HGB 12.5  --   HCT 39.5  --   PLT 372  --   LABPROT 13.1  --   INR 1.0  --   CREATININE 0.67  --   TROPONINIHS 9 8    Estimated Creatinine Clearance: 147.8 mL/min (by C-G formula based on SCr of 0.67 mg/dL).   Medical History: Past Medical History:  Diagnosis Date   Anxiety    Chronic anemia    Chronic hypertension complicating or reason for care during pregnancy 01/10/2012   patient denies/ unable to remove   Complication of anesthesia    pt states she was given too much anesthesia takes a long time to wake up   Depression    history of depression- none now   Dysmenorrhea    Heartburn in pregnancy    LBBB (left bundle branch block)    pt states she was born with it   Maternal anemia complicating pregnancy, childbirth, or the puerperium 01/10/2012   Morbidly obese ([place]) 01/10/2012   MTHFR mutation    PONV (postoperative nausea and vomiting)    S/P cesarean section (11/12, rpt, suspect macrosomia) 01/10/2012   Thyroid disease     Medications:  No evidence of PTA AC meds  Assessment: Pharmacy consulted to dose heparin in this 47 yo F who presents to the ED with chest pain and cough.  D-Dimer 0.84.  CT could not rule out PE.  Baseline Labs: Hgb 12.5, Plt 372, INR 1.0, aPTT ordered    Goal of Therapy:  Heparin level 0.3-0.7 units/ml Monitor platelets by anticoagulation protocol: Yes   Plan:   Give 6000 units bolus x 1 Start heparin infusion at 1700 units/hr Check anti-Xa level in 6 hours and daily while on heparin Continue to monitor H&H and platelets  Alison Murray 05/04/2022,9:18 AM

## 2022-05-04 NOTE — Plan of Care (Signed)
  Problem: Activity: Goal: Ability to tolerate increased activity will improve Outcome: Progressing   Problem: Clinical Measurements: Goal: Ability to maintain a body temperature in the normal range will improve Outcome: Progressing   Problem: Respiratory: Goal: Ability to maintain adequate ventilation will improve Outcome: Progressing Goal: Ability to maintain a clear airway will improve Outcome: Progressing   Problem: Education: Goal: Knowledge of General Education information will improve Description: Including pain rating scale, medication(s)/side effects and non-pharmacologic comfort measures Outcome: Progressing   Problem: Health Behavior/Discharge Planning: Goal: Ability to manage health-related needs will improve Outcome: Progressing   Problem: Clinical Measurements: Goal: Ability to maintain clinical measurements within normal limits will improve Outcome: Progressing Goal: Will remain free from infection Outcome: Progressing Goal: Diagnostic test results will improve Outcome: Progressing Goal: Respiratory complications will improve Outcome: Progressing Goal: Cardiovascular complication will be avoided Outcome: Progressing   Problem: Activity: Goal: Risk for activity intolerance will decrease Outcome: Progressing   Problem: Nutrition: Goal: Adequate nutrition will be maintained Outcome: Progressing   Problem: Elimination: Goal: Will not experience complications related to bowel motility Outcome: Progressing Goal: Will not experience complications related to urinary retention Outcome: Progressing   Problem: Pain Managment: Goal: General experience of comfort will improve Outcome: Progressing   Problem: Safety: Goal: Ability to remain free from injury will improve Outcome: Progressing   Problem: Skin Integrity: Goal: Risk for impaired skin integrity will decrease Outcome: Progressing

## 2022-05-05 DIAGNOSIS — I2699 Other pulmonary embolism without acute cor pulmonale: Secondary | ICD-10-CM

## 2022-05-05 DIAGNOSIS — Z148 Genetic carrier of other disease: Secondary | ICD-10-CM

## 2022-05-05 DIAGNOSIS — J189 Pneumonia, unspecified organism: Secondary | ICD-10-CM

## 2022-05-05 LAB — HEPARIN LEVEL (UNFRACTIONATED)
Heparin Unfractionated: 0.43 IU/mL (ref 0.30–0.70)
Heparin Unfractionated: 0.25 IU/mL — ABNORMAL LOW (ref 0.30–0.70)

## 2022-05-05 LAB — CBC
HCT: 34.6 % — ABNORMAL LOW (ref 36.0–46.0)
Hemoglobin: 10.8 g/dL — ABNORMAL LOW (ref 12.0–15.0)
MCH: 26.2 pg (ref 26.0–34.0)
MCHC: 31.2 g/dL (ref 30.0–36.0)
MCV: 84 fL (ref 80.0–100.0)
Platelets: 306 10*3/uL (ref 150–400)
RBC: 4.12 MIL/uL (ref 3.87–5.11)
RDW: 14.8 % (ref 11.5–15.5)
WBC: 8.1 10*3/uL (ref 4.0–10.5)
nRBC: 0 % (ref 0.0–0.2)

## 2022-05-05 LAB — RESPIRATORY PANEL BY PCR

## 2022-05-05 LAB — BASIC METABOLIC PANEL
Anion gap: 8 (ref 5–15)
BUN: 9 mg/dL (ref 6–20)
CO2: 25 mmol/L (ref 22–32)
Calcium: 8.4 mg/dL — ABNORMAL LOW (ref 8.9–10.3)
Chloride: 104 mmol/L (ref 98–111)
Creatinine, Ser: 0.57 mg/dL (ref 0.44–1.00)
GFR, Estimated: >60 mL/min (ref >60)
Glucose, Bld: 115 mg/dL — ABNORMAL HIGH (ref 70–99)
Potassium: 3.5 mmol/L (ref 3.5–5.1)
Sodium: 137 mmol/L (ref 135–145)

## 2022-05-05 LAB — EXPECTORATED SPUTUM ASSESSMENT W GRAM STAIN, RFLX TO RESP C

## 2022-05-05 LAB — ANTITHROMBIN III: AntiThromb III Func: 101 % (ref 75–120)

## 2022-05-05 MED ORDER — ONDANSETRON HCL 4 MG PO TABS: 4.0000 mg | ORAL_TABLET | Freq: Four times a day (QID) | ORAL | Status: DC | PRN

## 2022-05-05 MED ORDER — APIXABAN 5 MG PO TABS: 5.0000 mg | ORAL_TABLET | Freq: Two times a day (BID) | ORAL | Status: DC

## 2022-05-05 MED ORDER — APIXABAN 5 MG PO TABS
10.0000 mg | ORAL_TABLET | Freq: Two times a day (BID) | ORAL | Status: DC
  Administered 2022-05-05 – 2022-05-06 (×3): 10 mg via ORAL
  Filled 2022-05-05 (×3): qty 2

## 2022-05-05 MED ORDER — APIXABAN (ELIQUIS) EDUCATION KIT FOR DVT/PE PATIENTS: PACK | Freq: Once | Status: DC

## 2022-05-05 MED ORDER — CEFDINIR 300 MG PO CAPS
300.0000 mg | ORAL_CAPSULE | Freq: Two times a day (BID) | ORAL | Status: DC
  Administered 2022-05-05 – 2022-05-06 (×3): 300 mg via ORAL
  Filled 2022-05-05 (×3): qty 1

## 2022-05-05 NOTE — Progress Notes (Signed)
Progress Note   Patient: [name] DOB: 1975/09/21 DOA: 05/04/2022     1 DOS: the patient was seen and examined on 05/05/2022   Brief hospital course: [name] is a 47 y.o. female with medical history significant of morbid obesity BMI 68.66, s/p of Roux-en-Y, hypothyroidism, depression with anxiety, MTHFR mutation, left bundle blockade, who presented on 05/04/2022 with chest pain and cough.  She reported ongoing cough x about 3 months, treated outpatient with multiple antibiotic courses including doxycycline and z-pack with temporary improvement and then worsened.  Now cough productive of blood tinged sputum.  She developed severe left-sided chest pain with inspirations that prompted her to come to ED.  In the ED, mildly tachycardic, not hypoxic, D-Dimer was elevated 0.84.  Negative for Covid/Flu/RSV.  CTA chest was suboptimal due to respiratory motion, but no central PE was seen.  There was peripheral wedge-shaped consolidation of the bilateral upper lobes and right middle lobe felt to represent either bronchopneumonia versus pulmonary infarcts from subsegmental Pe's.  Patient was started on IV heparin and empiric IV antibiotics for possible pneumonia.    3/8 -- pt declines U/S to rule out lower extremity DVT's due to pain with this procedure in the past.  Remains hemodynamically stable and not hypoxic.  IV infiltrated and difficult peripheral access, given clinical stability, transitioned from IV heparin to PO Eliquis for anticoagulation.  Antibiotics changed to oral as well.  Echo in pending.   Possibly able to d/c tomorrow if stable and pain adequately controlled.  Assessment and Plan: * CAP (community acquired pneumonia) Sinusitis acute on chronic Bronchitis / bronchopneumonia - not ruled out --Transition IV antiboitics to oral due to IV access problems --Allergic to Augmentin, will treat with Omnicef as alternative --Treated in recent months with Z-pack and Doxy with  only temporary improvement and recurrent symptoms --Suspect cough and hemoptysis due to acute Pe's --Check full respiratory viral panel --Check sputum culture  Severe sepsis (HCC) Versus SIRS due to acute PE's POA with leukocytosis, tachycardia, tachypnea, elevated lacti acidosis.  Pt does not appear septic, but does have sinusitis and probably bronchitis.   Vitals improved and leukocytosis resolved. Monitor closely.  Hypothyroidism Not taking medications currently -f/u with PCP  LBBB (left bundle branch block) Noted. Chronic.  Depression with anxiety Patient's not taking medications currently.   --Monitor  Morbidly obese (HCC) Body mass index is 68.66 kg/m. 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 Follow up with Outpatient providers. Recommend lifestyle efforts at weight loss including diet and exercise.  Acute pulmonary embolism (HCC) Right-sided pleuritic chest pain Upon review of clinical history, subsegmental Pe's seem more likely than pneumonia, although pt does seem to have some sinusititis and ?bronchitis. --Continue anticoagulation - transition heparin to Eliquis given IV access issues --No apparent provoking factors except MTHFR mutation --May require life long anticoagulation, but will defer duration of therapy to her outpatient providers. --Consider hematology referral outpatient if further evaluation is indicated --Pt declines U/S of lower extremities for DVT eval, cites pain with this procedure in the past. 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         Subjective: Pt seated edge of bed, husband at bedside when seen this AM.  She reports ongoing  right-sided chest pain with inhalation.  Otherwise doing okay.  Has cough and continues to appear blood-tinged.  No other acute complaints.  We discussed possibility of blood clots / PE versus pneumonia.  She said she's had penumonia before, but never had this severe chest pain with breathing from it.    Physical Exam: Vitals:   05/04/22 1631 05/04/22 1952 05/04/22 2327 05/05/22 0803  BP: (!) 143/72 138/80 (!) 142/79 114/65  Pulse: 96 (!) 102 75 71  Resp: '16  18 18  '$ Temp: 99.1 F (37.3 C)  97.8 F (36.6 C) 98.1 F (36.7 C)  TempSrc:      SpO2: 100% 95% 98% 100%  Weight:      Height:       General exam: awake, alert, no acute distress, obese, pleasant and conversational HEENT: atraumatic, clear conjunctiva, anicteric sclera, moist mucus membranes, hearing grossly normal  Respiratory system: CTAB with diminished bases, no wheezes, no rhonchi, normal respiratory effort at rest on room air Cardiovascular system: normal S1/S2, RRR, unable to visualize JVD, no pedal edema.   Gastrointestinal system: soft, NT, ND, no HSM felt, +bowel sounds. Central nervous system: A&O x4. no gross focal neurologic deficits, normal speech Extremities: moves all, no edema, normal tone Skin: dry, intact, normal temperature, normal color, No rashes, lesions or ulcers Psychiatry: normal mood, congruent affect, judgement and insight appear normal   Data Reviewed:  Notable labs --- leukocytosis resolved.  Hbg 10.8  Family Communication: husband at bedside on rounds  Disposition: Status is: Inpatient Remains inpatient appropriate because: severity of illness, ongoing evaluation as outlined. Possibly d/c tomorrow if stable and pain controlled on PO meds   Planned Discharge Destination: Home    Time spent: 42 minutes  Author: Ezekiel Slocumb, DO 05/05/2022 3:30 PM  For on call review [email or web address].

## 2022-05-05 NOTE — Assessment & Plan Note (Signed)
Not taking medications currently -f/u with PCP

## 2022-05-05 NOTE — Hospital Course (Signed)
[name] is a 47 y.o. female with medical history significant of morbid obesity BMI 68.66, s/p of Roux-en-Y, hypothyroidism, depression with anxiety, MTHFR mutation, left bundle blockade, who presented on 05/04/2022 with chest pain and cough.  She reported ongoing cough x about 3 months, treated outpatient with multiple antibiotic courses including doxycycline and z-pack with temporary improvement and then worsened.  Now cough productive of blood tinged sputum.  She developed severe left-sided chest pain with inspirations that prompted her to come to ED.  In the ED, mildly tachycardic, not hypoxic, D-Dimer was elevated 0.84.  Negative for Covid/Flu/RSV.  CTA chest was suboptimal due to respiratory motion, but no central PE was seen.  There was peripheral wedge-shaped consolidation of the bilateral upper lobes and right middle lobe felt to represent either bronchopneumonia versus pulmonary infarcts from subsegmental Pe's.  Patient was started on IV heparin and empiric IV antibiotics for possible pneumonia.    3/8 -- pt declines U/S to rule out lower extremity DVT's due to pain with this procedure in the past.  Remains hemodynamically stable and not hypoxic.  IV infiltrated and difficult peripheral access, given clinical stability, transitioned from IV heparin to PO Eliquis for anticoagulation.  Antibiotics changed to oral as well.  Echo in pending.   Possibly able to d/c tomorrow if stable and pain adequately controlled.

## 2022-05-05 NOTE — Consult Note (Signed)
ANTICOAGULATION CONSULT NOTE  Pharmacy Consult for heparin infusion Indication: pulmonary embolus  Allergies  Allergen Reactions   Adhesive [Tape] Hives   Amoxicillin Hives    Childhood allergy. Pt. Can tolerate Cephalosporins.    Patient Measurements: Height: '5\' 4"'$  (162.6 cm) Weight: (!) 181.4 kg (400 lb) IBW/kg (Calculated) : 54.7 Heparin Dosing Weight: 102.3 kg  Vital Signs: Temp: 97.8 F (36.6 C) (03/07 2327) BP: 142/79 (03/07 2327) Pulse Rate: 75 (03/07 2327)  Labs: Recent Labs    05/04/22 0501 05/04/22 0647 05/04/22 1001 05/04/22 2126 05/05/22 0438  HGB 12.5  --   --   --   --   HCT 39.5  --   --   --   --   PLT 372  --   --   --   --   APTT  --   --  22*  --   --   LABPROT 13.1  --   --   --   --   INR 1.0  --   --   --   --   HEPARINUNFRC  --   --   --  0.29* 0.43  CREATININE 0.67  --   --   --   --   TROPONINIHS 9 8  --   --   --      Estimated Creatinine Clearance: 147.8 mL/min (by C-G formula based on SCr of 0.67 mg/dL).   Medical History: Past Medical History:  Diagnosis Date   Anxiety    Chronic anemia    Chronic hypertension complicating or reason for care during pregnancy 01/10/2012   patient denies/ unable to remove   Complication of anesthesia    pt states she was given too much anesthesia takes a long time to wake up   Depression    history of depression- none now   Dysmenorrhea    Heartburn in pregnancy    LBBB (left bundle branch block)    pt states she was born with it   Maternal anemia complicating pregnancy, childbirth, or the puerperium 01/10/2012   Morbidly obese ([place]) 01/10/2012   MTHFR mutation    PONV (postoperative nausea and vomiting)    S/P cesarean section (11/12, rpt, suspect macrosomia) 01/10/2012   Thyroid disease     Medications:  No evidence of PTA AC meds  Assessment: 47 yo F who presents to the ED with chest pain and cough.  D-Dimer 0.84.  CT could not rule out PE. Pharmacy consulted to dose  heparin  Date Time HL Rate/Comment 3/7 2126 0.29 Subtherapeutic / 1700 > 1900 u/hr 3/8 0438 0.43 Therapeutic x 1  Goal of Therapy:  Heparin level 0.3-0.7 units/ml Monitor platelets by anticoagulation protocol: Yes  Plan:  Continue heparin infusion at 1900 units/hr Recheck HL in 6 hr to confirm Continue to monitor H&H and platelets daily while on heparin gtt.  Renda Rolls, PharmD, [facility] 05/05/2022 5:21 AM

## 2022-05-05 NOTE — Assessment & Plan Note (Addendum)
Right-sided pleuritic chest pain Upon review of clinical history, subsegmental Pe's seem more likely than pneumonia, although pt does seem to have some sinusititis and ?bronchitis. --Treated with heparin >> Elquis. --Stop anticoagulation given negative NM perfusion scan --No apparent provoking factors for PE/DVT except MTHFR mutation --Pt declined U/S of lower extremities for DVT eval, cites pain with this procedure in the past.  Unable to rule out DVT. --Echocardiogram no right heart strain --V/Q perfusion scan Negative for PE

## 2022-05-05 NOTE — TOC Progression Note (Signed)
Transition of Care [facility]) - Progression Note    Patient Details  Name: [name] MRN: [id number] Date of Birth: 08/09/1975  Transition of Care [facility]) CM/SW [place], RN Phone Number: 05/05/2022, 11:39 AM  Clinical Narrative:     The patient continues to have Continued heparin infusion at 1900 units/hr    TOC to continue to monitor for any needs    Expected Discharge Plan and Services                                               Social Determinants of Health (SDOH) Interventions SDOH Screenings   Food Insecurity: No Food Insecurity (05/04/2022)  Housing: Low Risk  (05/04/2022)  Transportation Needs: No Transportation Needs (05/04/2022)  Utilities: Not At Risk (05/04/2022)  Tobacco Use: Low Risk  (05/04/2022)    Readmission Risk Interventions     No data to display

## 2022-05-05 NOTE — Assessment & Plan Note (Signed)
Body mass index is 68.66 kg/m. 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 Follow up with Outpatient providers. Recommend lifestyle efforts at weight loss including diet and exercise.

## 2022-05-05 NOTE — Plan of Care (Signed)
  Problem: Activity: Goal: Ability to tolerate increased activity will improve Outcome: Progressing   Problem: Respiratory: Goal: Ability to maintain adequate ventilation will improve Outcome: Progressing Goal: Ability to maintain a clear airway will improve Outcome: Progressing   Problem: Clinical Measurements: Goal: Will remain free from infection Outcome: Progressing Goal: Respiratory complications will improve Outcome: Progressing Goal: Cardiovascular complication will be avoided Outcome: Progressing   Problem: Activity: Goal: Risk for activity intolerance will decrease Outcome: Progressing   Problem: Safety: Goal: Ability to remain free from injury will improve Outcome: Progressing

## 2022-05-05 NOTE — Assessment & Plan Note (Signed)
Patient's not taking medications currently.   --Monitor

## 2022-05-05 NOTE — Assessment & Plan Note (Addendum)
Sinusitis acute on chronic Bronchitis / bronchopneumonia - not ruled out Hemoptysis  - due to above - improving Clinically improving.   --Transitioned IV antiboitics to oral due to IV access problems --Allergic to Augmentin, will treat with Omnicef as alternative --Treated in recent months with Z-pack and Doxy with only temporary improvement and recurrent symptoms --Negative respiratory viral panel --Sputum culture - reincubated for better growth --Discharge on Omnicef to complete course --PCP follow up

## 2022-05-05 NOTE — Assessment & Plan Note (Signed)
Versus SIRS due to acute PE's POA with leukocytosis, tachycardia, tachypnea, elevated lacti acidosis.  Pt does not appear septic, but does have sinusitis and probably bronchitis.   Vitals improved and leukocytosis resolved. Monitor closely.

## 2022-05-05 NOTE — Assessment & Plan Note (Signed)
Increases risk of PE/DVT - will continue treating for PE at this time, as imaging and clinical history seem more consistent with PE than PNA (pleuritic chest pain unlike prior pneumonia per patient, hemoptysis, failure to respond to multiple antibiotic courses recently). Also can cause frequent upper respiratory infection which pt has had her entire life. Consider referral to academic center for specialist familiar with this condition and its management.

## 2022-05-05 NOTE — Assessment & Plan Note (Signed)
Noted. Chronic.  

## 2022-05-06 ENCOUNTER — Other Ambulatory Visit: Payer: Self-pay

## 2022-05-06 ENCOUNTER — Inpatient Hospital Stay: Payer: Medicaid Other

## 2022-05-06 ENCOUNTER — Inpatient Hospital Stay (HOSPITAL_COMMUNITY)
Admit: 2022-05-06 | Discharge: 2022-05-06 | Disposition: A | Discharge status: Home / Self Care | Payer: Medicaid Other | Attending: Internal Medicine | Admitting: Internal Medicine

## 2022-05-06 DIAGNOSIS — R6520 Severe sepsis without septic shock: Secondary | ICD-10-CM

## 2022-05-06 DIAGNOSIS — A419 Sepsis, unspecified organism: Secondary | ICD-10-CM

## 2022-05-06 DIAGNOSIS — I5042 Chronic combined systolic (congestive) and diastolic (congestive) heart failure: Secondary | ICD-10-CM

## 2022-05-06 DIAGNOSIS — I447 Left bundle-branch block, unspecified: Secondary | ICD-10-CM

## 2022-05-06 DIAGNOSIS — I2609 Other pulmonary embolism with acute cor pulmonale: Secondary | ICD-10-CM

## 2022-05-06 LAB — ECHOCARDIOGRAM COMPLETE
AR max vel: 1.89 cm2
AV Peak grad: 10.1 mmHg
Ao pk vel: 1.59 m/s
Area-P 1/2: 8.25 cm2
Calc EF: 47.5 %
Height: 64 in
S' Lateral: 4.5 cm
Single Plane A2C EF: 53.7 %
Single Plane A4C EF: 35.6 %
Weight: 6400 oz

## 2022-05-06 LAB — CBC
HCT: 35.3 % — ABNORMAL LOW (ref 36.0–46.0)
Hemoglobin: 10.8 g/dL — ABNORMAL LOW (ref 12.0–15.0)
MCH: 25.6 pg — ABNORMAL LOW (ref 26.0–34.0)
MCHC: 30.6 g/dL (ref 30.0–36.0)
MCV: 83.6 fL (ref 80.0–100.0)
Platelets: 334 10*3/uL (ref 150–400)
RBC: 4.22 MIL/uL (ref 3.87–5.11)
RDW: 14.7 % (ref 11.5–15.5)
WBC: 5.6 10*3/uL (ref 4.0–10.5)
nRBC: 0 % (ref 0.0–0.2)

## 2022-05-06 LAB — BETA-2-GLYCOPROTEIN I ABS, IGG/M/A
Beta-2 Glyco I IgG: <9 GPI IgG units (ref 0–20)
Beta-2-Glycoprotein I IgA: <9 GPI IgA units (ref 0–25)
Beta-2-Glycoprotein I IgM: <9 GPI IgM units (ref 0–32)

## 2022-05-06 LAB — BASIC METABOLIC PANEL
Anion gap: 7 (ref 5–15)
BUN: 8 mg/dL (ref 6–20)
CO2: 26 mmol/L (ref 22–32)
Calcium: 8.8 mg/dL — ABNORMAL LOW (ref 8.9–10.3)
Chloride: 106 mmol/L (ref 98–111)
Creatinine, Ser: 0.63 mg/dL (ref 0.44–1.00)
GFR, Estimated: >60 mL/min (ref >60)
Glucose, Bld: 102 mg/dL — ABNORMAL HIGH (ref 70–99)
Potassium: 3.9 mmol/L (ref 3.5–5.1)
Sodium: 139 mmol/L (ref 135–145)

## 2022-05-06 MED ORDER — ACETAMINOPHEN 325 MG PO TABS: 650.0000 mg | ORAL_TABLET | Freq: Four times a day (QID) | ORAL | Status: DC | PRN

## 2022-05-06 MED ORDER — CEFDINIR 300 MG PO CAPS: 300.0000 mg | ORAL_CAPSULE | Freq: Two times a day (BID) | ORAL | 0 refills | Status: AC

## 2022-05-06 MED ORDER — OXYCODONE-ACETAMINOPHEN 5-325 MG PO TABS: 1.0000 | ORAL_TABLET | ORAL | 0 refills | Status: DC | PRN

## 2022-05-06 MED ORDER — TECHNETIUM TO 99M ALBUMIN AGGREGATED
4.4000 | Freq: Once | INTRAVENOUS | Status: AC | PRN
  Administered 2022-05-06: 4.34 via INTRAVENOUS

## 2022-05-06 MED ORDER — PERFLUTREN LIPID MICROSPHERE
1.0000 mL | INTRAVENOUS | Status: AC | PRN
  Administered 2022-05-06: 4 mL via INTRAVENOUS

## 2022-05-06 NOTE — Discharge Summary (Signed)
Physician Discharge Summary   Patient: [name] MRN: [id number] DOB: 12-11-1975  Admit date:     05/04/2022  Discharge date: 05/06/22  Discharge Physician: Ezekiel Slocumb   PCP: Patient, No Pcp Per   Recommendations at discharge:   Follow up with Primary Care in 1-2 weeks Follow up / Refer to Cardiology for management of combined systolic / diastolic CHF Repeat BMP, CBC at follow up Recommend referral to specialist familiar with MTHFR gene variant (likely at academic center [facility], [place], [place])  Discharge Diagnoses: Principal Problem:   CAP (community acquired pneumonia) Active Problems:   Severe sepsis ([place])   Hypothyroidism   LBBB (left bundle branch block)   Depression with anxiety   Morbidly obese ([place])   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 *  Hospital Course: [name] is a 47 y.o. female with medical history significant of morbid obesity BMI 68.66, s/p of Roux-en-Y, hypothyroidism, depression with anxiety, MTHFR mutation, left bundle blockade, who presented on 05/04/2022 with chest pain and cough.  She reported ongoing cough x about 3 months, treated outpatient with multiple antibiotic courses including doxycycline and z-pack with temporary improvement and then worsened.  Now cough productive of blood tinged sputum.  She developed severe left-sided chest pain with inspirations that prompted her to come to ED.  In the ED, mildly tachycardic, not hypoxic, D-Dimer was elevated 0.84.  Negative for Covid/Flu/RSV.  CTA chest was suboptimal due to respiratory motion, but no central PE was seen.  There was peripheral wedge-shaped consolidation of the bilateral upper lobes and right middle lobe felt to represent either bronchopneumonia versus pulmonary infarcts from subsegmental Pe's.  Patient was started on IV  heparin and empiric IV antibiotics for possible pneumonia.    3/8 -- pt declines U/S to rule out lower extremity DVT's due to pain with this procedure in the past.  Remains hemodynamically stable and not hypoxic.  IV infiltrated and difficult peripheral access, given clinical stability, transitioned from IV heparin to PO Eliquis for anticoagulation.  Antibiotics changed to oral as well.  3/9 -- NM pulmonary perfusion study obtained to clarify diagnosis of PNA vs PE. It was negative, pulmonary embolism absent.  There was a single perfusion defect in right mid-lung zone which radiologist compared to the CTA and noted enhancing vessels without evidence of any PE within that area of opacity.  Given also mild opacities in b/l upper lobes without perfusion defects in those areas on perfusion study, evaluation at this point seems more consistent with pneumonia.  This is likely secondary to prolonged / recurrent sinusitis and bronchitis.    Echo was obtained and showed reduced LV EF of 35-40% and grade I diastolic dysfunction.  Recommended Cardiology outpatient follow up.  Patient agreeable.   Discussed differential and test results in detail with patient and husband.  Agreeable to d/c on antibiotics.  No need to continue anticoagulation at this time, given no hard evidence for blod clots.  Note, she did decline U/S to assess for DVT's.  She was given instructions to return to ED if pleuritic chest pain recurs or worsening.     Stable for discharge with outpatient follow up.  Assessment and Plan: * CAP (community acquired pneumonia) Sinusitis acute on chronic Bronchitis / bronchopneumonia - not ruled out Hemoptysis  - due to above - improving Clinically improving.   --  Transitioned IV antiboitics to oral due to IV access problems --Allergic to Augmentin, will treat with Omnicef as alternative --Treated in recent months with Z-pack and Doxy with only temporary improvement and recurrent  symptoms --Negative respiratory viral panel --Sputum culture - reincubated for better growth --Discharge on Omnicef to complete course --PCP follow up  Severe sepsis ([place]) Versus SIRS due to acute PE's POA with leukocytosis, tachycardia, tachypnea, elevated lacti acidosis.  Pt does not appear septic, but does have sinusitis and probably bronchitis.   Vitals improved and leukocytosis resolved. Monitor closely.  Hypothyroidism Not taking medications currently -f/u with PCP  LBBB (left bundle branch block) Noted. Chronic.  Depression with anxiety Patient's not taking medications currently.   --Monitor  Morbidly obese (HCC) Body mass index is 68.66 kg/m. 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 Follow up with Outpatient providers. Recommend lifestyle efforts at weight loss including diet and exercise.  Chronic combined systolic and diastolic CHF (congestive heart failure) ([place]) Presumed chronic, but no prior echo for comparison. 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 LV EF is reduced at 35-40% with LV global hypokinesis (possibly in part due to LBBB), grade I diastolic dysfunction, mild MR.   Volume status is stable / euvolemic, well-compensated. --Recommend Cardiology follow up as outpatient --Provided Dr. Donivan Scull contact info  Acute pulmonary embolism ([place]) Right-sided pleuritic chest pain Upon review of clinical history, subsegmental Pe's seem more likely than pneumonia, although pt does seem to have some sinusititis and ?bronchitis. --Treated with heparin >> Elquis. --Stop anticoagulation given negative NM perfusion scan --No apparent provoking factors for PE/DVT except MTHFR mutation --Pt declined U/S of lower extremities for DVT eval, cites pain with this procedure in the past.  Unable to rule out DVT. 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 Also can cause frequent upper respiratory infection which pt has had her entire life. Consider referral to academic center for specialist familiar with this condition and its management.           Consultants: None Procedures performed: Echo, NM perfusion scan  Disposition: Home Diet recommendation:  Discharge Diet Orders (From admission, onward)     Start     Ordered   05/06/22 0000  Diet - low sodium heart healthy        05/06/22 1410           Regular diet DISCHARGE MEDICATION: Allergies as of 05/06/2022       Reactions   Adhesive [tape] Hives   Amoxicillin Hives   Childhood allergy. Pt. Can tolerate Cephalosporins.        Medication List     STOP taking these medications    buPROPion 300 MG 24 hr tablet Commonly known as: WELLBUTRIN XL   doxycycline 100 MG capsule Commonly known as: VIBRAMYCIN   thyroid 90 MG tablet Commonly known as: ARMOUR       TAKE these medications    acetaminophen 325 MG tablet Commonly known as: TYLENOL Take 2 tablets (650 mg total) by mouth every 6 (six) hours as needed for mild pain, fever or headache.   cefdinir 300 MG capsule Commonly known as: OMNICEF Take 1 capsule (300 mg  total) by mouth every 12 (twelve) hours for 5 days.   cetirizine 10 MG tablet Commonly known as: ZYRTEC Take 10 mg by mouth daily as needed for allergies.   metoprolol tartrate 25 MG tablet Commonly known as: LOPRESSOR Take 25 mg by mouth 2 (two) times daily.   oxyCODONE-acetaminophen 5-325 MG tablet Commonly known as: PERCOCET/ROXICET Take 1 tablet by mouth every 4 (four) hours as needed for moderate pain.        Follow-up Information     Gollan, Kathlene November, MD Follow up.   Specialty: Cardiology Why:  Recommend outpatient follow up for Echo finding of reduced EF Contact information: [address] [address] [phone]         Jane Canary, MD Follow up.   Specialty: Oncology Why: follow up for MTHFR gene variant Contact information: [address] [place] [address] [phone]                Discharge Exam: Filed Weights   05/04/22 0450 05/04/22 0452  Weight: (!) 183.7 kg (!) 181.4 kg   General exam: awake, alert, no acute distress, obese HEENT: atraumatic, clear conjunctiva, anicteric sclera, moist mucus membranes, hearing grossly normal  Respiratory system: CTAB, no wheezes, rales or rhonchi, normal respiratory effort. Cardiovascular system: normal S1/S2, RRR, no JVD, murmurs, rubs, gallops,  no pedal edema.   Gastrointestinal system: soft, NT, ND, no HSM felt, +bowel sounds. Central nervous system: A&O x4. no gross focal neurologic deficits, normal speech Extremities: moves all, no edema, normal tone Skin: dry, intact, normal temperature, normal color, No rashes, lesions or ulcers Psychiatry: normal mood, congruent affect, judgement and insight appear normal   Condition at discharge: stable  The results of significant diagnostics from this hospitalization (including imaging, microbiology, ancillary and laboratory) are listed below for reference.   Imaging Studies: NM Pulmonary Perfusion  Result Date: 05/06/2022 CLINICAL DATA:  47 year old female chest pain and shortness of breath. EXAM: NUCLEAR MEDICINE PERFUSION LUNG SCAN TECHNIQUE: Perfusion images were obtained in multiple projections after intravenous injection of radiopharmaceutical. Ventilation scans intentionally deferred if perfusion scan and chest x-ray adequate for interpretation during COVID 19 epidemic. RADIOPHARMACEUTICALS:  4.3 mCi Tc-81mMAA IV COMPARISON:  05/04/2022 CTA chest and 05/06/2022 chest radiograph FINDINGS: A single perfusion defect along the  periphery of the RIGHT mid lung is noted. This corresponds to the CT and chest radiograph opacity. On the recent CTA chest, no pulmonary emboli in this area is identified and enhancing vessels within this opacity are noted. No other perfusion defects are noted. IMPRESSION: Pulmonary embolism absent Electronically Signed   By: JMargarette CanadaM.D.   On: 05/06/2022 12:12   DG Chest 1 View  Result Date: 05/06/2022 CLINICAL DATA:  Shortness of breath. EXAM: CHEST  1 VIEW COMPARISON:  05/04/2022 CT and chest radiograph FINDINGS: Increased lung volumes noted since the prior study. The cardiomediastinal silhouette is unchanged. Focal opacity overlying the RIGHT mid lung is not significantly changed. No new pulmonary opacities, pneumothorax or pleural effusion identified. Mild peribronchial thickening again identified. No acute bony abnormalities are noted. IMPRESSION: 1. Improved lung volumes without other significant change. Unchanged RIGHT mid lung opacity which may represent pneumonia. Electronically Signed   By: JMargarette CanadaM.D.   On: 05/06/2022 11:25   ECHOCARDIOGRAM COMPLETE  Result Date: 05/06/2022    ECHOCARDIOGRAM REPORT   Patient Name:   AILSE MCKERNDate of Exam: 05/06/2022 Medical Rec #:  [id number]    Height:  64.0 in Accession #:    [id number]    Weight:       400.0 lb Date of Birth:  1975/12/11     BSA:          2.627 m Patient Age:    82 years      BP:           146/82 mmHg Patient Gender: F             HR:           71 bpm. Exam Location:  [facility] Procedure: 2D Echo and Intracardiac Opacification Agent Indications:     Pulmonary Embolus I26.09  History:         Patient has no prior history of Echocardiogram examinations.  Sonographer:     Kathlen Brunswick RDCS Referring Phys:  [id number] Claiborne Billings A Gillian Meeuwsen Diagnosing Phys: Ida Rogue MD  Sonographer Comments: Technically challenging study due to limited acoustic windows, suboptimal apical window, no subcostal window and patient is obese. Image  acquisition challenging due to patient body habitus. IMPRESSIONS  1. Left ventricular ejection fraction, by estimation, is 35 to 40%. The left ventricle has moderately decreased function. The left ventricle demonstratesmoderate global hypokinesis, significant mid to distal anteroseptal and septal hypokinesis possibly secondary to conduction abnormality (BBB) . The left ventricular internal cavity size was mildly dilated. Left ventricular diastolic parameters are consistent with Grade I diastolic dysfunction (impaired relaxation).  2. Right ventricular systolic function is normal. The right ventricular size is normal.  3. The mitral valve is normal in structure. Mild mitral valve regurgitation. No evidence of mitral stenosis.  4. The aortic valve has an indeterminant number of cusps. Aortic valve regurgitation is not visualized. No aortic stenosis is present.  5. The inferior vena cava is normal in size with greater than 50% respiratory variability, suggesting right atrial pressure of 3 mmHg. FINDINGS  Left Ventricle: Left ventricular ejection fraction, by estimation, is 35 to 40%. The left ventricle has moderately decreased function. The left ventricle demonstrates global hypokinesis. Definity contrast agent was given IV to delineate the left ventricular endocardial borders. The left ventricular internal cavity size was mildly dilated. There is no left ventricular hypertrophy. Left ventricular diastolic parameters are consistent with Grade I diastolic dysfunction (impaired relaxation). Right Ventricle: The right ventricular size is normal. No increase in right ventricular wall thickness. Right ventricular systolic function is normal. Left Atrium: Left atrial size was normal in size. Right Atrium: Right atrial size was normal in size. Pericardium: There is no evidence of pericardial effusion. Mitral Valve: The mitral valve is normal in structure. Mild mitral valve regurgitation. No evidence of mitral valve stenosis.  Tricuspid Valve: The tricuspid valve is normal in structure. Tricuspid valve regurgitation is not demonstrated. No evidence of tricuspid stenosis. Aortic Valve: The aortic valve has an indeterminant number of cusps. Aortic valve regurgitation is not visualized. No aortic stenosis is present. Aortic valve peak gradient measures 10.1 mmHg. Pulmonic Valve: The pulmonic valve was normal in structure. Pulmonic valve regurgitation is not visualized. No evidence of pulmonic stenosis. Aorta: The aortic root is normal in size and structure. Venous: The inferior vena cava is normal in size with greater than 50% respiratory variability, suggesting right atrial pressure of 3 mmHg. IAS/Shunts: No atrial level shunt detected by color flow Doppler.  LEFT VENTRICLE PLAX 2D LVIDd:         6.10 cm      Diastology LVIDs:         4.50  cm      LV e' medial:    5.44 cm/s LV PW:         1.15 cm      LV E/e' medial:  20.0 LV IVS:        1.10 cm      LV e' lateral:   11.40 cm/s LVOT diam:     1.90 cm      LV E/e' lateral: 9.6 LV SV:         63 LV SV Index:   24 LVOT Area:     2.84 cm  LV Volumes (MOD) LV vol d, MOD A2C: 313.0 ml LV vol d, MOD A4C: 185.5 ml LV vol s, MOD A2C: 145.0 ml LV vol s, MOD A4C: 119.4 ml LV SV MOD A2C:     168.0 ml LV SV MOD A4C:     185.5 ml LV SV MOD BP:      120.0 ml RIGHT VENTRICLE RV Basal diam:  2.70 cm RV S prime:     14.80 cm/s TAPSE (M-mode): 1.8 cm LEFT ATRIUM             Index        RIGHT ATRIUM           Index LA diam:        4.60 cm 1.75 cm/m   RA Area:     12.20 cm LA Vol (A2C):   36.2 ml 13.78 ml/m  RA Volume:   26.30 ml  10.01 ml/m LA Vol (A4C):   58.8 ml 22.39 ml/m LA Biplane Vol: 50.2 ml 19.11 ml/m  AORTIC VALVE                 PULMONIC VALVE AV Area (Vmax): 1.89 cm     PV Vmax:        1.48 m/s AV Vmax:        159.00 cm/s  PV Peak grad:   8.8 mmHg AV Peak Grad:   10.1 mmHg    RVOT Peak grad: 7 mmHg LVOT Vmax:      106.00 cm/s LVOT Vmean:     74.600 cm/s LVOT VTI:       0.221 m  AORTA Ao  Root diam: 2.90 cm Ao Asc diam:  2.80 cm MITRAL VALVE MV Area (PHT): 8.25 cm     SHUNTS MV Decel Time: 92 msec      Systemic VTI:  0.22 m MV E velocity: 109.00 cm/s  Systemic Diam: 1.90 cm MV A velocity: 124.00 cm/s MV E/A ratio:  0.88 Ida Rogue MD Electronically signed by Ida Rogue MD Signature Date/Time: 05/06/2022/10:41:04 AM    Final    CT Angio Chest PE W and/or Wo Contrast  Result Date: 05/04/2022 CLINICAL DATA:  47 year old female with chest pain and abnormal D-dimer. EXAM: CT ANGIOGRAPHY CHEST WITH CONTRAST TECHNIQUE: Multidetector CT imaging of the chest was performed using the standard protocol during bolus administration of intravenous contrast. Multiplanar CT image reconstructions and MIPs were obtained to evaluate the vascular anatomy. RADIATION DOSE REDUCTION: This exam was performed according to the departmental dose-optimization program which includes automated exposure control, adjustment of the mA and/or kV according to patient size and/or use of iterative reconstruction technique. CONTRAST:  172m OMNIPAQUE IOHEXOL 350 MG/ML SOLN COMPARISON:  Portable chest x-ray 0546 hours today. CT Abdomen and Pelvis 07/28/2018. FINDINGS: Cardiovascular: Suboptimal, but adequate contrast bolus timing in the pulmonary arterial tree. Respiratory motion. No central or lobar embolus. No convincing segmental  pulmonary artery filling defect, but the subsegmental and distal branches are not well evaluated. Borderline to mild cardiomegaly. No pericardial effusion. Negative visible aorta. Mediastinum/Nodes: No mediastinal mass or lymphadenopathy. Small reactive appearing right hilar lymph nodes. Lungs/Pleura: Intermittent respiratory motion. Major airways are patent. Fairly widespread scattered bilateral upper lobe peribronchial sub solid and solid lung opacity. Plus 4-5 cm area of consolidation in the inferior right upper lobe abutting the minor fissure (series 6, image 61). However, this consolidation is  somewhat peripheral and wedge-shaped. No superimposed pleural effusion. Early right middle lobe peribronchial patchy, inflammatory appearing opacity. Lingular relatively spared. Both lower lobes relatively spared with costophrenic angle opacity which more resembles atelectasis. Upper Abdomen: Chronic postoperative changes with partially visible gastrojejunostomy. Absent gallbladder. No free air, free fluid, or dilated bowel in the visible upper abdomen. Musculoskeletal: No acute osseous abnormality identified. Review of the MIP images confirms the above findings. IMPRESSION: 1. Suboptimal pulmonary artery contrast timing and limited vessel detail exacerbated by respiratory motion. No central or lobar pulmonary embolus. But a subsegmental pulmonary embolus would be difficult to exclude (see #2). 2. Peripheral and wedge-shaped 4-5 cm area of consolidation in the inferior right upper lobe. However, superimposed multifocal additional bilateral upper lobe and right middle lobe peribronchial opacity which most resembles Acute Bronchopneumonia. Therefore favor bilateral Pneumonia. However, if there are no clinical signs or symptoms of infection then a pulmonary infarct could not be excluded. 3. No pleural effusion. Borderline to mild cardiomegaly. 4. Chronic postoperative changes in the upper abdomen. Electronically Signed   By: Genevie Ann M.D.   On: 05/04/2022 07:19   DG Chest Portable 1 View  Result Date: 05/04/2022 CLINICAL DATA:  47 year old female with history of chest pain. Productive cough and bloody sputum. EXAM: PORTABLE CHEST 1 VIEW COMPARISON:  Chest x-ray 03/22/2022. FINDINGS: Lung volumes are low. No consolidative airspace disease. However, there is mild diffuse interstitial prominence and patchy areas of peribronchial cuffing. No pleural effusions. No pneumothorax. No pulmonary nodule or mass noted. Pulmonary vasculature and the cardiomediastinal silhouette are within normal limits. IMPRESSION: 1. Findings  are concerning for possible acute bronchitis. Electronically Signed   By: Vinnie Langton M.D.   On: 05/04/2022 06:24    Microbiology: Results for orders placed or performed during the hospital encounter of 05/04/22  Resp panel by RT-PCR (RSV, Flu A&B, Covid) Anterior Nasal Swab     Status: None   Collection Time: 05/04/22  5:09 AM   Specimen: Anterior Nasal Swab  Result Value Ref Range Status   SARS Coronavirus 2 by RT PCR NEGATIVE NEGATIVE Final    Comment: (NOTE) SARS-CoV-2 target nucleic acids are NOT DETECTED.  The SARS-CoV-2 RNA is generally detectable in upper respiratory specimens during the acute phase of infection. The lowest concentration of SARS-CoV-2 viral copies this assay can detect is 138 copies/mL. A negative result does not preclude SARS-Cov-2 infection and should not be used as the sole basis for treatment or other patient management decisions. A negative result may occur with  improper specimen collection/handling, submission of specimen other than nasopharyngeal swab, presence of viral mutation(s) within the areas targeted by this assay, and inadequate number of viral copies(<138 copies/mL). A negative result must be combined with clinical observations, patient history, and epidemiological information. The expected result is Negative.  Fact Sheet for Patients:  EntrepreneurPulse.com.au  Fact Sheet for Healthcare Providers:  IncredibleEmployment.be  This test is no t yet approved or cleared by the [place] FDA and  has been authorized for detection  and/or diagnosis of SARS-CoV-2 by FDA under an Emergency Use Authorization (EUA). This EUA will remain  in effect (meaning this test can be used) for the duration of the COVID-19 declaration under Section 564(b)(1) of the Act, 21 U.S.C.section 360bbb-3(b)(1), unless the authorization is terminated  or revoked sooner.       Influenza A by PCR NEGATIVE NEGATIVE Final    Influenza B by PCR NEGATIVE NEGATIVE Final    Comment: (NOTE) The Xpert Xpress SARS-CoV-2/FLU/RSV plus assay is intended as an aid in the diagnosis of influenza from Nasopharyngeal swab specimens and should not be used as a sole basis for treatment. Nasal washings and aspirates are unacceptable for Xpert Xpress SARS-CoV-2/FLU/RSV testing.  Fact Sheet for Patients: EntrepreneurPulse.com.au  Fact Sheet for Healthcare Providers: IncredibleEmployment.be  This test is not yet approved or cleared by the [place] FDA and has been authorized for detection and/or diagnosis of SARS-CoV-2 by FDA under an Emergency Use Authorization (EUA). This EUA will remain in effect (meaning this test can be used) for the duration of the COVID-19 declaration under Section 564(b)(1) of the Act, 21 U.S.C. section 360bbb-3(b)(1), unless the authorization is terminated or revoked.     Resp Syncytial Virus by PCR NEGATIVE NEGATIVE Final    Comment: (NOTE) Fact Sheet for Patients: EntrepreneurPulse.com.au  Fact Sheet for Healthcare Providers: IncredibleEmployment.be  This test is not yet approved or cleared by the [place] FDA and has been authorized for detection and/or diagnosis of SARS-CoV-2 by FDA under an Emergency Use Authorization (EUA). This EUA will remain in effect (meaning this test can be used) for the duration of the COVID-19 declaration under Section 564(b)(1) of the Act, 21 U.S.C. section 360bbb-3(b)(1), unless the authorization is terminated or revoked.  Performed at [facility], [address] [place], [place] [address]   Culture, blood (routine x 2) Call MD if unable to obtain prior to antibiotics being given     Status: None (Preliminary result)   Collection Time: 05/04/22 10:01 AM   Specimen: BLOOD RIGHT HAND  Result Value Ref Range Status   Specimen Description BLOOD RIGHT HAND  Final    Special Requests   Final    BOTTLES DRAWN AEROBIC AND ANAEROBIC Blood Culture adequate volume   Culture   Final    NO GROWTH 2 DAYS Performed at [facility], [address], [place], [place] [address]    Report Status PENDING  Incomplete  Culture, blood (routine x 2) Call MD if unable to obtain prior to antibiotics being given     Status: None (Preliminary result)   Collection Time: 05/04/22 10:04 AM   Specimen: BLOOD LEFT HAND  Result Value Ref Range Status   Specimen Description BLOOD LEFT HAND  Final   Special Requests   Final    BOTTLES DRAWN AEROBIC ONLY Blood Culture adequate volume   Culture   Final    NO GROWTH 2 DAYS Performed at [facility], [address] [place], [place] [address]    Report Status PENDING  Incomplete  Respiratory (~20 pathogens) panel by PCR     Status: None   Collection Time: 05/05/22 10:21 AM   Specimen: Nasopharyngeal Swab; Respiratory  Result Value Ref Range Status   Adenovirus NOT DETECTED NOT DETECTED Final   Coronavirus 229E NOT DETECTED NOT DETECTED Final    Comment: (NOTE) The Coronavirus on the Respiratory Panel, DOES NOT test for the novel  Coronavirus (2019 nCoV)    Coronavirus HKU1 NOT DETECTED NOT  DETECTED Final   Coronavirus NL63 NOT DETECTED NOT DETECTED Final   Coronavirus OC43 NOT DETECTED NOT DETECTED Final   Metapneumovirus NOT DETECTED NOT DETECTED Final   Rhinovirus / Enterovirus NOT DETECTED NOT DETECTED Final   Influenza A NOT DETECTED NOT DETECTED Final   Influenza B NOT DETECTED NOT DETECTED Final   Parainfluenza Virus 1 NOT DETECTED NOT DETECTED Final   Parainfluenza Virus 2 NOT DETECTED NOT DETECTED Final   Parainfluenza Virus 3 NOT DETECTED NOT DETECTED Final   Parainfluenza Virus 4 NOT DETECTED NOT DETECTED Final   Respiratory Syncytial Virus NOT DETECTED NOT DETECTED Final   Bordetella pertussis NOT DETECTED NOT DETECTED Final   Bordetella Parapertussis NOT DETECTED NOT DETECTED Final    Chlamydophila pneumoniae NOT DETECTED NOT DETECTED Final   Mycoplasma pneumoniae NOT DETECTED NOT DETECTED Final    Comment: Performed at [facility], [place] [address] [place], [place] [address]  Expectorated Sputum Assessment w Gram Stain, Rflx to Resp Cult     Status: None   Collection Time: 05/05/22 11:46 AM   Specimen: Expectorated Sputum  Result Value Ref Range Status   Specimen Description EXPECTORATED SPUTUM  Final   Special Requests NONE  Final   Sputum evaluation   Final    THIS SPECIMEN IS ACCEPTABLE FOR SPUTUM CULTURE Performed at [facility], [address] [place], [place] [address]    Report Status 05/05/2022 FINAL  Final  Culture, Respiratory w Gram Stain     Status: None (Preliminary result)   Collection Time: 05/05/22 11:46 AM  Result Value Ref Range Status   Specimen Description   Final    EXPECTORATED SPUTUM Performed at [facility], [address] [place], [place] [address]    Special Requests   Final    NONE Reflexed from [id number] Performed at [facility], [address], [place], [place] [address]    Gram Stain PENDING  Incomplete   Culture   Final    CULTURE REINCUBATED FOR BETTER GROWTH Performed at [facility], [place] [address] [place], [place] [address]    Report Status PENDING  Incomplete    Labs: CBC: Recent Labs  Lab 05/04/22 0501 05/05/22 0632 05/06/22 0628  WBC 13.1* 8.1 5.6  HGB 12.5 10.8* 10.8*  HCT 39.5 34.6* 35.3*  MCV 83.0 84.0 83.6  PLT 372 306 [id number]   Basic Metabolic Panel: Recent Labs  Lab 05/04/22 0501 05/05/22 0632 05/06/22 0628  NA 136 137 139  K 4.1 3.5 3.9  CL 104 104 106  CO2 '22 25 26  '$ GLUCOSE 129* 115* 102*  BUN '11 9 8  '$ CREATININE 0.67 0.57 0.63  CALCIUM 9.0 8.4* 8.8*   Liver Function Tests: Recent Labs  Lab 05/04/22 0501  AST 73*  ALT 58*  ALKPHOS 96  BILITOT 0.9  PROT 7.8  ALBUMIN 3.7   CBG: No results for input(s): "GLUCAP" in the last 168  hours.  Discharge time spent: greater than 30 minutes.  Signed: Ezekiel Slocumb, DO [facility] 05/06/2022

## 2022-05-06 NOTE — Assessment & Plan Note (Signed)
Presumed chronic, but no prior echo for comparison. 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 LV EF is reduced at 35-40% with LV global hypokinesis (possibly in part due to LBBB), grade I diastolic dysfunction, mild MR.   Volume status is stable / euvolemic, well-compensated. --Recommend Cardiology follow up as outpatient --Provided Dr. Donivan Scull contact info

## 2022-05-06 NOTE — Progress Notes (Signed)
  Echocardiogram 2D Echocardiogram has been performed. Definity IV ultrasound imaging agent used on this study.  [name] 05/06/2022, 9:41 AM

## 2022-05-06 NOTE — Discharge Instructions (Signed)
Information on my medicine - ELIQUIS (apixaban)  This medication education was reviewed with me or my healthcare representative as part of my discharge preparation.    Why was Eliquis prescribed for you? Eliquis was prescribed to treat blood clots that may have been found in the veins of your legs (deep vein thrombosis) or in your lungs (pulmonary embolism) and to reduce the risk of them occurring again.  What do You need to know about Eliquis ? The starting dose is 10 mg (two 5 mg tablets) taken TWICE daily for the FIRST SEVEN (7) DAYS, then on (enter date)  05/12/2022  the dose is reduced to ONE 5 mg tablet taken TWICE daily.  Eliquis may be taken with or without food.   Try to take the dose about the same time in the morning and in the evening. If you have difficulty swallowing the tablet whole please discuss with your pharmacist how to take the medication safely.  Take Eliquis exactly as prescribed and DO NOT stop taking Eliquis without talking to the doctor who prescribed the medication.  Stopping may increase your risk of developing a new blood clot.  Refill your prescription before you run out.  After discharge, you should have regular check-up appointments with your healthcare provider that is prescribing your Eliquis.    What do you do if you miss a dose? If a dose of ELIQUIS is not taken at the scheduled time, take it as soon as possible on the same day and twice-daily administration should be resumed. The dose should not be doubled to make up for a missed dose.  Important Safety Information A possible side effect of Eliquis is bleeding. You should call your healthcare provider right away if you experience any of the following: Bleeding from an injury or your nose that does not stop. Unusual colored urine (red or dark brown) or unusual colored stools (red or black). Unusual bruising for unknown reasons. A serious fall or if you hit your head (even if there is no  bleeding).  Some medicines may interact with Eliquis and might increase your risk of bleeding or clotting while on Eliquis. To help avoid this, consult your healthcare provider or pharmacist prior to using any new prescription or non-prescription medications, including herbals, vitamins, non-steroidal anti-inflammatory drugs (NSAIDs) and supplements.  This website has more information on Eliquis (apixaban): http://www.eliquis.com/eliquis/home

## 2022-05-07 LAB — LUPUS ANTICOAGULANT PANEL
DRVVT: 37.5 s (ref 0.0–47.0)
PTT Lupus Anticoagulant: 33.2 s (ref 0.0–43.5)

## 2022-05-07 LAB — PROTEIN S, TOTAL: Protein S Ag, Total: 123 % (ref 60–150)

## 2022-05-07 LAB — PROTEIN S ACTIVITY: Protein S Activity: 99 % (ref 63–140)

## 2022-05-07 LAB — PROTEIN C ACTIVITY: Protein C Activity: 142 % (ref 73–180)

## 2022-05-07 LAB — PROTEIN C, TOTAL: Protein C, Total: 117 % (ref 60–150)

## 2022-05-08 LAB — CARDIOLIPIN ANTIBODIES, IGG, IGM, IGA
Anticardiolipin IgA: <9 APL U/mL (ref 0–11)
Anticardiolipin IgG: <9 GPL U/mL (ref 0–14)
Anticardiolipin IgM: 11 MPL U/mL (ref 0–12)

## 2022-05-08 LAB — HOMOCYSTEINE: Homocysteine: 7.5 umol/L (ref 0.0–14.5)

## 2022-05-09 ENCOUNTER — Encounter: Payer: Self-pay | Admitting: Oncology

## 2022-05-09 LAB — CULTURE, BLOOD (ROUTINE X 2)
Culture: NO GROWTH
Culture: NO GROWTH
Special Requests: ADEQUATE
Special Requests: ADEQUATE

## 2022-05-15 LAB — CULTURE, RESPIRATORY W GRAM STAIN: Culture: NORMAL

## 2022-05-17 LAB — FACTOR 5 LEIDEN

## 2022-05-19 LAB — PROTHROMBIN GENE MUTATION

## 2022-06-20 ENCOUNTER — Encounter: Payer: Self-pay | Admitting: Oncology

## 2022-06-21 ENCOUNTER — Encounter: Payer: Self-pay | Admitting: Oncology

## 2022-07-17 ENCOUNTER — Encounter: Payer: Self-pay | Admitting: Oncology

## 2022-07-26 ENCOUNTER — Encounter: Payer: Self-pay | Admitting: Oncology

## 2023-09-27 ENCOUNTER — Encounter: Payer: Self-pay | Admitting: Oncology

## 2023-09-28 ENCOUNTER — Ambulatory Visit
Admission: RE | Admit: 2023-09-28 | Discharge: 2023-09-28 | Disposition: A | Discharge status: Home / Self Care | Payer: Self-pay | Source: Ambulatory Visit | Attending: Emergency Medicine | Admitting: Emergency Medicine

## 2023-09-28 VITALS — BP 136/85 | HR 75 | Temp 98.9°F | Resp 18

## 2023-09-28 DIAGNOSIS — I447 Left bundle-branch block, unspecified: Secondary | ICD-10-CM | POA: Diagnosis not present

## 2023-09-28 MED ORDER — METOPROLOL TARTRATE 25 MG PO TABS: 25.0000 mg | ORAL_TABLET | Freq: Two times a day (BID) | ORAL | 2 refills | Status: DC

## 2023-09-28 NOTE — ED Triage Notes (Addendum)
 Patient needs refill on Metoprolol  Tartrate 25 mg PO BID. Patient voices no other concerns or complaints.

## 2023-09-28 NOTE — ED Provider Notes (Signed)
 [facility]    CSN: [id number] Arrival date & time: 09/28/23  1220      History   Chief Complaint Chief Complaint  Patient presents with   Medication Refill    Entered by patient    HPI [name] is a 48 y.o. female.   Patient presents requesting refill for metoprolol  25 mg twice daily.  Endorses she has been taking it since childhood to manage bundle branch block.  Currently does not have a PCP due to change in insurance.  Has been taking without missing dosages.  Past Medical History:  Diagnosis Date   Anxiety    Chronic anemia    Chronic hypertension complicating or reason for care during pregnancy 01/10/2012   patient denies/ unable to remove   Complication of anesthesia    pt states she was given too much anesthesia takes a long time to wake up   Depression    history of depression- none now   Dysmenorrhea    Heartburn in pregnancy    LBBB (left bundle branch block)    pt states she was born with it   Maternal anemia complicating pregnancy, childbirth, or the puerperium 01/10/2012   Morbidly obese (HCC) 01/10/2012   MTHFR mutation    PONV (postoperative nausea and vomiting)    S/P cesarean section (11/12, rpt, suspect macrosomia) 01/10/2012   Thyroid  disease     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 There are several moderate sized diagonal branch which are normal. The left circumflex artery is normal. It is a very large vessel. The right coronary artery is large and dominant. EF 65%. Smooth and normal coronary arteries. Normal left ventricular systolic function.    CARDIOVASCULAR STRESS TEST  2004   revealed anterior apical ischemia.   CESAREAN SECTION  6/08   Dr. Gorge   CESAREAN SECTION WITH BILATERAL TUBAL LIGATION  01/09/2012   Procedure: CESAREAN SECTION WITH BILATERAL TUBAL LIGATION;  Surgeon: Marie-Lyne Lavoie, MD;  Location: [facility];  Service: Obstetrics;  Laterality: Bilateral;  Repeat C/S   EDD: 01/11/12   CYSTOSCOPY N/A 04/07/2013   Procedure: CYSTOSCOPY;  Surgeon: Ronal Elvie Pinal, MD;  Location: [facility];  Service: Gynecology;  Laterality: N/A;   GASTRIC BYPASS  2003   with cholecystectomy, in [place]   NASAL SEPTOPLASTY W/ TURBINOPLASTY Bilateral 02/24/2015   Procedure: NASAL SEPTOPLASTY WITH endoscopic bilateral middle turbinate  and bilateral partial inferior turbinate reduction;  Surgeon: Deward Argue, MD;  Location: [facility];  Service: ENT;  Laterality: Bilateral;   ROBOTIC ASSISTED TOTAL HYSTERECTOMY Bilateral 04/07/2013  Procedure: ROBOTIC ASSISTED TOTAL HYSTERECTOMY WITH  BILATERAL SALPINGECTOMY;  Surgeon: Ronal Elvie Pinal, MD;  Location: [facility];  Service: Gynecology;  Laterality: Bilateral;  extra time for BMI   TUBAL LIGATION      OB History     Gravida  4   Para  2   Term  2   Preterm  0   AB  2   Living  2      SAB  2   IAB  0   Ectopic  0   Multiple  0   Live Births  2            Home Medications    Prior to Admission medications   Medication Sig Start Date End Date  Taking? Authorizing Provider  acetaminophen  (TYLENOL ) 325 MG tablet Take 2 tablets (650 mg total) by mouth every 6 (six) hours as needed for mild pain, fever or headache. 05/06/22   Fausto Burnard LABOR, DO  cetirizine (ZYRTEC) 10 MG tablet Take 10 mg by mouth daily as needed for allergies.    [provider]  metoprolol  tartrate (LOPRESSOR ) 25 MG tablet Take 1 tablet (25 mg total) by mouth 2 (two) times daily. 09/28/23 10/28/23  Teresa Shelba SAUNDERS, NP  oxyCODONE -acetaminophen  (PERCOCET/ROXICET) 5-325 MG tablet Take 1 tablet by mouth every 4 (four) hours as needed for moderate pain. 05/06/22   Fausto Burnard LABOR, DO    Family History Family History  Problem Relation Age of Onset   Alzheimer's disease Father     Social History Social History   Tobacco Use   Smoking status: Never   Smokeless tobacco: Never  Vaping Use   Vaping status: Never Used  Substance Use Topics   Alcohol use: No   Drug use: No     Allergies   Adhesive [tape] and Amoxicillin   Review of Systems Review of Systems   Physical Exam Triage Vital Signs ED Triage Vitals  Encounter Vitals Group     BP 09/28/23 1236 136/85     Girls Systolic BP Percentile --      Girls Diastolic BP Percentile --      Boys Systolic BP Percentile --      Boys Diastolic BP Percentile --      Pulse Rate 09/28/23 1236 75     Resp 09/28/23 1236 18     Temp 09/28/23 1236 98.9 F (37.2 C)     Temp Source 09/28/23 1236 Oral     SpO2 09/28/23 1236 97 %     Weight --      Height --      Head Circumference --      Peak Flow --      Pain Score 09/28/23 1240 0     Pain Loc --      Pain Education --      Exclude from Growth Chart --    No data found.  Updated Vital Signs BP 136/85 (BP Location: Right Arm)   Pulse 75   Temp 98.9 F (37.2 C) (Oral)   Resp 18   LMP 02/28/2013   SpO2 97%   Visual Acuity Right Eye Distance:   Left Eye Distance:   Bilateral Distance:    Right Eye Near:   Left Eye Near:    Bilateral  Near:     Physical Exam Constitutional:      Appearance: Normal appearance.  Eyes:     Extraocular Movements: Extraocular movements intact.  Pulmonary:  Effort: Pulmonary effort is normal.  Neurological:     Mental Status: She is alert and oriented to person, place, and time.      UC Treatments / Results  Labs (all labs ordered are listed, but only abnormal results are displayed) Labs Reviewed - No data to display  EKG   Radiology No results found.  Procedures Procedures (including critical care time)  Medications Ordered in UC Medications - No data to display  Initial Impression / Assessment and Plan / UC Course  I have reviewed the triage vital signs and the nursing notes.  Pertinent labs & imaging results that were available during my care of the patient were reviewed by me and considered in my medical decision making (see chart for details).  Left bundle branch block  Refilled for 90 days, PCP appointment made prior to discharge, advised to continue taking without missing dosages, may follow-up as needed Final Clinical Impressions(s) / UC Diagnoses   Final diagnoses:  Left bundle branch block     Discharge Instructions      Occasions been refilled for 90 days  During this timeframe please work to find a new primary doctor for further management as they are able to prescribe a year refill   ED Prescriptions     Medication Sig Dispense Auth. Provider   metoprolol  tartrate (LOPRESSOR ) 25 MG tablet Take 1 tablet (25 mg total) by mouth 2 (two) times daily. 60 tablet Letzy Gullickson R, NP      PDMP not reviewed this encounter.   Teresa Shelba SAUNDERS, NP 09/28/23 1329

## 2023-09-28 NOTE — Discharge Instructions (Signed)
 Occasions been refilled for 90 days  During this timeframe please work to find a new primary doctor for further management as they are able to prescribe a year refill

## 2023-11-09 ENCOUNTER — Ambulatory Visit: Admitting: Family Medicine

## 2024-02-01 ENCOUNTER — Encounter: Payer: Self-pay | Admitting: Family Medicine

## 2024-02-01 ENCOUNTER — Ambulatory Visit: Admitting: Family Medicine

## 2024-02-01 VITALS — BP 140/90 | HR 71 | Resp 16 | Ht 64.0 in | Wt >= 6400 oz

## 2024-02-01 DIAGNOSIS — I447 Left bundle-branch block, unspecified: Secondary | ICD-10-CM

## 2024-02-01 DIAGNOSIS — Z7689 Persons encountering health services in other specified circumstances: Secondary | ICD-10-CM

## 2024-02-01 MED ORDER — METOPROLOL TARTRATE 25 MG PO TABS: 25.0000 mg | ORAL_TABLET | Freq: Two times a day (BID) | ORAL | 4 refills | Status: AC

## 2024-02-01 NOTE — Progress Notes (Unsigned)
 New Patient Office Visit  Subjective   Patient ID: [name], female    DOB: September 15, 1975  Age: 48 y.o. MRN: [id number]  CC:  Chief Complaint  Patient presents with   Establish Care   Discussed the use of AI scribe software for clinical note transcription with the patient, who gave verbal consent to proceed.  History of Present Illness   [name] is a 48 year old female who presents to establish with [facility] at [facility]. Her last PCP was with [facility]. She has seen cardiology and OBGYN specialties in the past. PMHx: LBBB, hypothyroidism, Roux-en-Y (2003)  She has a history of bundle branch block and is currently taking metoprolol . She avoids over-the-counter medications, preferring herbal remedies.  She has a history of hypothyroidism and has consulted multiple doctors and specialists, including endocrinologists, without a definitive resolution. She struggles with weight management, having undergone gastric bypass surgery in 2003, initially losing significant weight but regaining it after pregnancies. She has tried various prescriptions, including metformin, but experienced adverse side effects. She maintains a healthy lifestyle, avoiding processed foods and sweets, and attributes her weight issues to hormonal imbalances.  She has a history of ENT issues, including allergies and sinus infections, for which she underwent sinus surgery in the past. She reports doing well since the surgery and manages her allergies naturally.  No history of diabetes, high cholesterol, or other metabolic conditions despite her weight. No issues with blood pressure in the past. She takes metoprolol  25mg  BID for LBBB.       Outpatient Encounter Medications as of 02/01/2024  Medication Sig   [DISCONTINUED] metoprolol  tartrate (LOPRESSOR ) 25 MG tablet Take 1 tablet (25 mg total) by mouth 2 (two) times daily.   metoprolol  tartrate (LOPRESSOR ) 25 MG tablet Take 1 tablet (25 mg  total) by mouth 2 (two) times daily.   [DISCONTINUED] acetaminophen  (TYLENOL ) 325 MG tablet Take 2 tablets (650 mg total) by mouth every 6 (six) hours as needed for mild pain, fever or headache. (Patient not taking: Reported on 02/01/2024)   [DISCONTINUED] cetirizine (ZYRTEC) 10 MG tablet Take 10 mg by mouth daily as needed for allergies. (Patient not taking: Reported on 02/01/2024)   [DISCONTINUED] oxyCODONE -acetaminophen  (PERCOCET/ROXICET) 5-325 MG tablet Take 1 tablet by mouth every 4 (four) hours as needed for moderate pain. (Patient not taking: Reported on 02/01/2024)   No facility-administered encounter medications on file as of 02/01/2024.    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   Chronic anemia 01/11/2012   S/P cesarean section (11/12, rpt & BTL) 01/10/2012   Chronic hypertension complicating or reason for care during pregnancy 01/10/2012   Morbidly obese (HCC) 01/10/2012   LBBB (left bundle branch block) 09/25/2011   MVP (mitral valve prolapse) 09/25/2011   Past Medical History:  Diagnosis Date   Anxiety    Chronic anemia    Chronic hypertension complicating or reason for care during pregnancy 01/10/2012   patient denies/ unable to remove   Complication of anesthesia    pt states she was given  too much anesthesia takes a long time to wake up   Depression    history of depression- none now   Dysmenorrhea    Heartburn in pregnancy    LBBB (left bundle branch block)    pt states she was born with it   Maternal anemia complicating  pregnancy, childbirth, or the puerperium 01/10/2012   Morbidly obese (HCC) 01/10/2012   MTHFR mutation    PONV (postoperative nausea and vomiting)    S/P cesarean section (11/12, rpt, suspect macrosomia) 01/10/2012   Thyroid  disease    Past Surgical History:  Procedure Laterality Date   ABDOMINAL HYSTERECTOMY     CARDIAC CATHETERIZATION  11-07-2002   The left anterior descending artery is smooth and normal. There are several moderate sized diagonal branch which are normal. The left circumflex artery is normal. It is a very large vessel. The right coronary artery is large and dominant. EF 65%. Smooth and normal coronary arteries. Normal left ventricular systolic function.    CARDIOVASCULAR STRESS TEST  2004   revealed anterior apical ischemia.   CESAREAN SECTION  6/08   Dr. Gorge   CESAREAN SECTION WITH BILATERAL TUBAL LIGATION  01/09/2012   Procedure: CESAREAN SECTION WITH BILATERAL TUBAL LIGATION;  Surgeon: Marie-Lyne Lavoie, MD;  Location: [facility];  Service: Obstetrics;  Laterality: Bilateral;  Repeat C/S   EDD: 01/11/12   CYSTOSCOPY N/A 04/07/2013   Procedure: CYSTOSCOPY;  Surgeon: Ronal Elvie Pinal, MD;  Location: [facility];  Service: Gynecology;  Laterality: N/A;   GASTRIC BYPASS  2003   with cholecystectomy, in [place]   NASAL SEPTOPLASTY W/ TURBINOPLASTY Bilateral 02/24/2015   Procedure: NASAL SEPTOPLASTY WITH endoscopic bilateral middle turbinate  and bilateral partial inferior turbinate reduction;  Surgeon: Deward Argue, MD;  Location: [facility];  Service: ENT;  Laterality: Bilateral;   ROBOTIC ASSISTED TOTAL HYSTERECTOMY Bilateral 04/07/2013   Procedure: ROBOTIC ASSISTED TOTAL HYSTERECTOMY WITH  BILATERAL SALPINGECTOMY;  Surgeon: Ronal Elvie Pinal, MD;  Location: [facility];  Service: Gynecology;  Laterality: Bilateral;  extra time for BMI   TUBAL LIGATION     Family History  Problem Relation Age of Onset   Alzheimer's disease Father    Social History   Socioeconomic History   Marital  status: Married    Spouse name: Not on file   Number of children: Not on file   Years of education: Not on file   Highest education level: Not on file  Occupational History   Not on file  Tobacco Use   Smoking status: Never   Smokeless tobacco: Never  Vaping Use   Vaping status: Never Used  Substance and Sexual Activity   Alcohol use: No   Drug use: No   Sexual activity: Yes    Partners: Male    Birth control/protection: Surgical    Comment: Tubal Ligation/Hyst  Other Topics Concern   Not on file  Social History Narrative   Not on file   Social Drivers of Health   Financial Resource Strain: Not on file  Food Insecurity: No Food Insecurity (05/04/2022)   Hunger Vital Sign    Worried About Running Out of Food in the Last Year: Never true    Ran Out of Food in the Last Year: Never true  Transportation Needs: No Transportation Needs (05/04/2022)   PRAPARE - Administrator, Civil Service (Medical): No    Lack of Transportation (Non-Medical): No  Physical Activity: Not on file  Stress: Not on file  Social Connections: Not  on file  Intimate Partner Violence: Not At Risk (05/04/2022)   Humiliation, Afraid, Rape, and Kick questionnaire    Fear of Current or Ex-Partner: No    Emotionally Abused: No    Physically Abused: No    Sexually Abused: No   Outpatient Medications Prior to Visit  Medication Sig Dispense Refill   metoprolol  tartrate (LOPRESSOR ) 25 MG tablet Take 1 tablet (25 mg total) by mouth 2 (two) times daily. 60 tablet 2   acetaminophen  (TYLENOL ) 325 MG tablet Take 2 tablets (650 mg total) by mouth every 6 (six) hours as needed for mild pain, fever or headache. (Patient not taking: Reported on 02/01/2024)     cetirizine (ZYRTEC) 10 MG tablet Take 10 mg by mouth daily as needed for allergies. (Patient not taking: Reported on 02/01/2024)     oxyCODONE -acetaminophen  (PERCOCET/ROXICET) 5-325 MG tablet Take 1 tablet by mouth every 4 (four) hours as needed for moderate  pain. (Patient not taking: Reported on 02/01/2024) 12 tablet 0   No facility-administered medications prior to visit.   Allergies  Allergen Reactions   Adhesive [Tape] Hives   Amoxicillin Hives    Childhood allergy. Pt. Can tolerate Cephalosporins.   ROS: see HPI    Objective   Today's Vitals   02/01/24 0914  BP: (!) 166/73  Pulse: 71  Resp: 16  SpO2: 99%  Weight: (!) 422 lb (191.4 kg)  Height: 5' 4 (1.626 m)  PainSc: 0-No pain   GENERAL: Well-appearing, in NAD. Well nourished.  SKIN: Pink, warm and dry. No rash, lesion, ulceration, or ecchymoses.  Head: Normocephalic. NECK: Trachea midline. Full ROM w/o pain or tenderness. No lymphadenopathy.  EARS: Tympanic membranes are intact, translucent without bulging and without drainage. Appropriate landmarks visualized.  EYES: Conjunctiva clear without exudates. EOMI, PERRL, no drainage present.  NOSE: Septum midline w/o deformity. Nares patent, mucosa pink and non-inflamed w/o drainage. No sinus tenderness.  THROAT: Uvula midline. Oropharynx clear. Tonsils non-inflamed without exudate. Mucous membranes pink and moist.  RESPIRATORY: Chest wall symmetrical. Respirations even and non-labored. Breath sounds clear to auscultation bilaterally.  CARDIAC: S1, S2 present, regular rate and rhythm without murmur or gallops. Peripheral pulses 2+ bilaterally.  MSK: Muscle tone and strength appropriate for age. Joints w/o tenderness, redness, or swelling.  EXTREMITIES: Without clubbing, cyanosis, or edema.  NEUROLOGIC: No motor or sensory deficits. Steady, even gait. C2-C12 intact.  PSYCH/MENTAL STATUS: Alert, oriented x 3. Cooperative, appropriate mood and affect.     Assessment & Plan:   1. Encounter to establish care (Primary) Patient is a 67- year-old female who presents today to establish care with primary care at [facility] Reviewed the past medical history, family history, social history, surgical history, medications and allergies  today- updates made as indicated. Patient has concerns today about medication refill.    2. LBBB (left bundle branch block) Chronic left bundle branch block managed with metoprolol . Slightly elevated blood pressure noted. Continue metoprolol  25mg  BID. Her repeat blood pressure had some improvement.  Advised her to monitor blood pressure at home and return to office if blood pressure is consistently 130/80 or greater.  - metoprolol  tartrate (LOPRESSOR ) 25 MG tablet; Take 1 tablet (25 mg total) by mouth 2 (two) times daily.  Dispense: 60 tablet; Refill: 4  3. Morbidly obese (HCC) Morbid obesity with weight gain post-gastric bypass. Previous medication attempts failed due to side effects. Patient reports healthy nutrition and daily exercise, such as walking.     Return in about 3 months (around 05/01/2024)  for Physical.   Evalene Arts, FNP

## 2024-02-01 NOTE — Patient Instructions (Signed)
